# Patient Record
Sex: Female | Born: 1945
Health system: Southern US, Community
[De-identification: ages and names within clinical notes are randomized; demographics above are authoritative.]

## PROBLEM LIST (undated history)

## (undated) DIAGNOSIS — S060XAA Concussion with loss of consciousness status unknown, initial encounter: Secondary | ICD-10-CM

## (undated) DIAGNOSIS — J45909 Unspecified asthma, uncomplicated: Secondary | ICD-10-CM

## (undated) DIAGNOSIS — R569 Unspecified convulsions: Secondary | ICD-10-CM

## (undated) DIAGNOSIS — G5600 Carpal tunnel syndrome, unspecified upper limb: Secondary | ICD-10-CM

## (undated) DIAGNOSIS — M722 Plantar fascial fibromatosis: Secondary | ICD-10-CM

## (undated) DIAGNOSIS — M1990 Unspecified osteoarthritis, unspecified site: Secondary | ICD-10-CM

## (undated) DIAGNOSIS — T7840XA Allergy, unspecified, initial encounter: Secondary | ICD-10-CM

## (undated) DIAGNOSIS — D689 Coagulation defect, unspecified: Secondary | ICD-10-CM

## (undated) DIAGNOSIS — E785 Hyperlipidemia, unspecified: Secondary | ICD-10-CM

## (undated) DIAGNOSIS — S060X9A Concussion with loss of consciousness of unspecified duration, initial encounter: Secondary | ICD-10-CM

## (undated) DIAGNOSIS — I10 Essential (primary) hypertension: Secondary | ICD-10-CM

## (undated) DIAGNOSIS — H269 Unspecified cataract: Secondary | ICD-10-CM

## (undated) DIAGNOSIS — C801 Malignant (primary) neoplasm, unspecified: Secondary | ICD-10-CM

## (undated) DIAGNOSIS — K219 Gastro-esophageal reflux disease without esophagitis: Secondary | ICD-10-CM

## (undated) DIAGNOSIS — Z973 Presence of spectacles and contact lenses: Secondary | ICD-10-CM

## (undated) HISTORY — DX: Hyperlipidemia, unspecified: E78.5

## (undated) HISTORY — PX: VAGINAL HYSTERECTOMY: SUR661

## (undated) HISTORY — PX: FRACTURE SURGERY: SHX138

## (undated) HISTORY — PX: CARDIAC CATHETERIZATION: SHX172

## (undated) HISTORY — DX: Concussion with loss of consciousness of unspecified duration, initial encounter: S06.0X9A

## (undated) HISTORY — PX: DILATION AND CURETTAGE OF UTERUS: SHX78

## (undated) HISTORY — PX: COLONOSCOPY: SHX174

## (undated) HISTORY — DX: Concussion with loss of consciousness status unknown, initial encounter: S06.0XAA

## (undated) HISTORY — DX: Allergy, unspecified, initial encounter: T78.40XA

## (undated) HISTORY — DX: Unspecified cataract: H26.9

## (undated) HISTORY — DX: Coagulation defect, unspecified: D68.9

---
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
## 1997-11-19 ENCOUNTER — Encounter: Admission: RE | Admit: 1997-11-19 | Discharge: 1997-11-19 | Payer: Self-pay | Admitting: Internal Medicine

## 1997-12-03 ENCOUNTER — Encounter: Admission: RE | Admit: 1997-12-03 | Discharge: 1997-12-03 | Payer: Self-pay | Admitting: Internal Medicine

## 1997-12-31 ENCOUNTER — Encounter: Admission: RE | Admit: 1997-12-31 | Discharge: 1997-12-31 | Payer: Self-pay | Admitting: Internal Medicine

## 1998-07-08 ENCOUNTER — Encounter: Admission: RE | Admit: 1998-07-08 | Discharge: 1998-07-08 | Payer: Self-pay | Admitting: Internal Medicine

## 1998-11-16 ENCOUNTER — Encounter: Admission: RE | Admit: 1998-11-16 | Discharge: 1998-11-16 | Payer: Self-pay | Admitting: Internal Medicine

## 1998-12-21 ENCOUNTER — Ambulatory Visit (HOSPITAL_COMMUNITY): Admission: RE | Admit: 1998-12-21 | Discharge: 1998-12-21 | Payer: Self-pay | Admitting: *Deleted

## 2000-01-03 ENCOUNTER — Encounter: Admission: RE | Admit: 2000-01-03 | Discharge: 2000-01-03 | Payer: Self-pay | Admitting: Internal Medicine

## 2000-01-05 ENCOUNTER — Ambulatory Visit (HOSPITAL_COMMUNITY): Admission: RE | Admit: 2000-01-05 | Discharge: 2000-01-05 | Payer: Self-pay

## 2000-01-31 ENCOUNTER — Encounter: Admission: RE | Admit: 2000-01-31 | Discharge: 2000-01-31 | Payer: Self-pay | Admitting: Internal Medicine

## 2004-04-29 ENCOUNTER — Encounter: Admission: RE | Admit: 2004-04-29 | Discharge: 2004-04-29 | Payer: Self-pay | Admitting: Internal Medicine

## 2004-06-25 ENCOUNTER — Ambulatory Visit (HOSPITAL_COMMUNITY): Admission: RE | Admit: 2004-06-25 | Discharge: 2004-06-25 | Payer: Self-pay | Admitting: *Deleted

## 2006-08-15 ENCOUNTER — Encounter: Admission: RE | Admit: 2006-08-15 | Discharge: 2006-08-15 | Payer: Self-pay | Admitting: Internal Medicine

## 2007-02-19 ENCOUNTER — Emergency Department (HOSPITAL_COMMUNITY): Admission: EM | Admit: 2007-02-19 | Discharge: 2007-02-19 | Payer: Self-pay | Admitting: Emergency Medicine

## 2008-02-18 ENCOUNTER — Emergency Department (HOSPITAL_COMMUNITY): Admission: EM | Admit: 2008-02-18 | Discharge: 2008-02-18 | Payer: Self-pay | Admitting: Family Medicine

## 2008-07-21 ENCOUNTER — Encounter: Admission: RE | Admit: 2008-07-21 | Discharge: 2008-07-21 | Payer: Self-pay | Admitting: Internal Medicine

## 2010-01-08 ENCOUNTER — Encounter: Admission: RE | Admit: 2010-01-08 | Discharge: 2010-01-08 | Payer: Self-pay | Admitting: Internal Medicine

## 2010-01-09 ENCOUNTER — Emergency Department (HOSPITAL_COMMUNITY): Admission: EM | Admit: 2010-01-09 | Discharge: 2010-01-09 | Payer: Self-pay | Admitting: Family Medicine

## 2010-01-09 HISTORY — PX: ORIF WRIST FRACTURE: SHX2133

## 2010-01-17 ENCOUNTER — Observation Stay (HOSPITAL_COMMUNITY): Admission: EM | Admit: 2010-01-17 | Discharge: 2010-01-19 | Payer: Self-pay | Admitting: Emergency Medicine

## 2010-01-18 ENCOUNTER — Encounter (INDEPENDENT_AMBULATORY_CARE_PROVIDER_SITE_OTHER): Payer: Self-pay | Admitting: Family Medicine

## 2010-01-26 ENCOUNTER — Ambulatory Visit (HOSPITAL_BASED_OUTPATIENT_CLINIC_OR_DEPARTMENT_OTHER): Admission: RE | Admit: 2010-01-26 | Discharge: 2010-01-26 | Payer: Self-pay | Admitting: Orthopedic Surgery

## 2010-06-23 LAB — POCT I-STAT, CHEM 8
Calcium, Ion: 1.14 mmol/L (ref 1.12–1.32)
Calcium, Ion: 1.15 mmol/L (ref 1.12–1.32)
Chloride: 107 mEq/L (ref 96–112)
Creatinine, Ser: 1.1 mg/dL (ref 0.4–1.2)
Glucose, Bld: 100 mg/dL — ABNORMAL HIGH (ref 70–99)
Glucose, Bld: 96 mg/dL (ref 70–99)
HCT: 39 % (ref 36.0–46.0)
HCT: 40 % (ref 36.0–46.0)
Hemoglobin: 13.3 g/dL (ref 12.0–15.0)
Hemoglobin: 13.6 g/dL (ref 12.0–15.0)
TCO2: 29 mmol/L (ref 0–100)
TCO2: 31 mmol/L (ref 0–100)

## 2010-06-24 LAB — D-DIMER, QUANTITATIVE: D-Dimer, Quant: 1.65 ug/mL-FEU — ABNORMAL HIGH (ref 0.00–0.48)

## 2010-06-24 LAB — TROPONIN I: Troponin I: 0.02 ng/mL (ref 0.00–0.06)

## 2010-06-24 LAB — BASIC METABOLIC PANEL
BUN: 6 mg/dL (ref 6–23)
CO2: 28 mEq/L (ref 19–32)
Calcium: 9.2 mg/dL (ref 8.4–10.5)
Calcium: 9.3 mg/dL (ref 8.4–10.5)
Chloride: 103 mEq/L (ref 96–112)
Chloride: 110 mEq/L (ref 96–112)
Creatinine, Ser: 0.79 mg/dL (ref 0.4–1.2)
Creatinine, Ser: 0.89 mg/dL (ref 0.4–1.2)
GFR calc Af Amer: >60 mL/min (ref >60)
GFR calc Af Amer: >60 mL/min (ref >60)
GFR calc non Af Amer: >60 mL/min (ref >60)
Glucose, Bld: 145 mg/dL — ABNORMAL HIGH (ref 70–99)
Potassium: 4 mEq/L (ref 3.5–5.1)
Sodium: 142 mEq/L (ref 135–145)

## 2010-06-24 LAB — CBC
MCH: 30 pg (ref 26.0–34.0)
MCHC: 32.2 g/dL (ref 30.0–36.0)
MCV: 93.6 fL (ref 78.0–100.0)
Platelets: 237 10*3/uL (ref 150–400)
Platelets: 259 10*3/uL (ref 150–400)
RBC: 4.04 MIL/uL (ref 3.87–5.11)
RDW: 13.6 % (ref 11.5–15.5)
WBC: 4.7 10*3/uL (ref 4.0–10.5)

## 2010-06-24 LAB — HEMOGLOBIN A1C: Mean Plasma Glucose: 131 mg/dL — ABNORMAL HIGH (ref <117)

## 2010-06-24 LAB — POCT CARDIAC MARKERS

## 2010-06-24 LAB — CARDIAC PANEL(CRET KIN+CKTOT+MB+TROPI)
CK, MB: 0.8 ng/mL (ref 0.3–4.0)
CK, MB: 1.1 ng/mL (ref 0.3–4.0)
Relative Index: 1.1 (ref 0.0–2.5)
Relative Index: 1.1 (ref 0.0–2.5)
Relative Index: INVALID (ref 0.0–2.5)
Total CK: 104 U/L (ref 7–177)
Total CK: 83 U/L (ref 7–177)
Troponin I: 0.01 ng/mL (ref 0.00–0.06)

## 2010-06-24 LAB — DIFFERENTIAL
Basophils Relative: 1 % (ref 0–1)
Eosinophils Absolute: 0.1 10*3/uL (ref 0.0–0.7)
Neutrophils Relative %: 57 % (ref 43–77)

## 2010-06-24 LAB — LIPID PANEL
HDL: 41 mg/dL (ref >39)
Total CHOL/HDL Ratio: 4.6 RATIO
VLDL: 30 mg/dL (ref 0–40)

## 2010-06-24 LAB — MAGNESIUM: Magnesium: 2.4 mg/dL (ref 1.5–2.5)

## 2011-01-18 LAB — I-STAT 8, (EC8 V) (CONVERTED LAB)
Bicarbonate: 32.1 — ABNORMAL HIGH
Glucose, Bld: 118 — ABNORMAL HIGH
Sodium: 140
TCO2: 34
pH, Ven: 7.336 — ABNORMAL HIGH

## 2011-01-18 LAB — POCT I-STAT CREATININE: Creatinine, Ser: 1

## 2011-03-26 ENCOUNTER — Encounter: Payer: Self-pay | Admitting: *Deleted

## 2011-03-26 ENCOUNTER — Emergency Department (HOSPITAL_COMMUNITY): Payer: 59

## 2011-03-26 ENCOUNTER — Emergency Department (HOSPITAL_COMMUNITY)
Admission: EM | Admit: 2011-03-26 | Discharge: 2011-03-26 | Disposition: A | Discharge status: Home / Self Care | Payer: 59 | Attending: Emergency Medicine | Admitting: Emergency Medicine

## 2011-03-26 ENCOUNTER — Other Ambulatory Visit: Payer: Self-pay

## 2011-03-26 DIAGNOSIS — M8560 Other cyst of bone, unspecified site: Secondary | ICD-10-CM | POA: Insufficient documentation

## 2011-03-26 DIAGNOSIS — M8500 Fibrous dysplasia (monostotic), unspecified site: Secondary | ICD-10-CM

## 2011-03-26 DIAGNOSIS — R1110 Vomiting, unspecified: Secondary | ICD-10-CM | POA: Insufficient documentation

## 2011-03-26 DIAGNOSIS — I10 Essential (primary) hypertension: Secondary | ICD-10-CM | POA: Insufficient documentation

## 2011-03-26 DIAGNOSIS — R197 Diarrhea, unspecified: Secondary | ICD-10-CM | POA: Insufficient documentation

## 2011-03-26 DIAGNOSIS — R42 Dizziness and giddiness: Secondary | ICD-10-CM | POA: Insufficient documentation

## 2011-03-26 DIAGNOSIS — R209 Unspecified disturbances of skin sensation: Secondary | ICD-10-CM | POA: Insufficient documentation

## 2011-03-26 DIAGNOSIS — R51 Headache: Secondary | ICD-10-CM

## 2011-03-26 HISTORY — DX: Essential (primary) hypertension: I10

## 2011-03-26 LAB — URINALYSIS, ROUTINE W REFLEX MICROSCOPIC
Bilirubin Urine: NEGATIVE
Nitrite: NEGATIVE
Specific Gravity, Urine: 1.006 (ref 1.005–1.030)
Urobilinogen, UA: 0.2 mg/dL (ref 0.0–1.0)

## 2011-03-26 LAB — DIFFERENTIAL
Eosinophils Absolute: 0.1 10*3/uL (ref 0.0–0.7)
Eosinophils Relative: 2 % (ref 0–5)
Lymphs Abs: 1.7 10*3/uL (ref 0.7–4.0)
Monocytes Absolute: 0.2 10*3/uL (ref 0.1–1.0)
Monocytes Relative: 4 % (ref 3–12)

## 2011-03-26 LAB — CBC
Hemoglobin: 13.5 g/dL (ref 12.0–15.0)
MCH: 30.1 pg (ref 26.0–34.0)
MCV: 92.4 fL (ref 78.0–100.0)
RBC: 4.48 MIL/uL (ref 3.87–5.11)

## 2011-03-26 LAB — LIPASE, BLOOD: Lipase: 29 U/L (ref 11–59)

## 2011-03-26 LAB — COMPREHENSIVE METABOLIC PANEL
Alkaline Phosphatase: 71 U/L (ref 39–117)
BUN: 12 mg/dL (ref 6–23)
Calcium: 9.3 mg/dL (ref 8.4–10.5)
Creatinine, Ser: 0.79 mg/dL (ref 0.50–1.10)
GFR calc Af Amer: >90 mL/min (ref >90)
Glucose, Bld: 115 mg/dL — ABNORMAL HIGH (ref 70–99)
Total Protein: 7.6 g/dL (ref 6.0–8.3)

## 2011-03-26 LAB — URINE MICROSCOPIC-ADD ON

## 2011-03-26 MED ORDER — MORPHINE SULFATE 4 MG/ML IJ SOLN
4.0000 mg | Freq: Once | INTRAMUSCULAR | Status: AC
  Administered 2011-03-26: 4 mg via INTRAVENOUS
  Filled 2011-03-26: qty 1

## 2011-03-26 MED ORDER — ONDANSETRON HCL 4 MG PO TABS: 4.0000 mg | ORAL_TABLET | Freq: Four times a day (QID) | ORAL | Status: AC

## 2011-03-26 MED ORDER — SODIUM CHLORIDE 0.9 % IV BOLUS (SEPSIS)
1000.0000 mL | Freq: Once | INTRAVENOUS | Status: AC
  Administered 2011-03-26: 1000 mL via INTRAVENOUS

## 2011-03-26 MED ORDER — AMLODIPINE BESYLATE 10 MG PO TABS: 10.0000 mg | ORAL_TABLET | Freq: Every day | ORAL | Status: DC

## 2011-03-26 MED ORDER — HYDROCODONE-ACETAMINOPHEN 5-325 MG PO TABS: 2.0000 | ORAL_TABLET | ORAL | Status: AC | PRN

## 2011-03-26 MED ORDER — ONDANSETRON HCL 4 MG/2ML IJ SOLN
4.0000 mg | Freq: Once | INTRAMUSCULAR | Status: AC
  Administered 2011-03-26: 4 mg via INTRAVENOUS
  Filled 2011-03-26: qty 2

## 2011-03-26 NOTE — ED Provider Notes (Signed)
History     CSN: [id number] Arrival date & time: 03/26/2011  7:56 AM   First MD Initiated Contact with Patient 03/26/11 [phone]      Chief Complaint  Patient presents with  . Hypertension  . Headache    (Consider location/radiation/quality/duration/timing/severity/associated sxs/prior treatment) HPI Patient presents to the emergency room with complaints of vomiting and diarrhea that started this morning. Associated with the vomiting and diarrhea she also had a headache. She was concerned because when she was vomiting she experienced some tingling in her left arm and both of her legs felt heavy. She did feel lightheaded as well. There has been no speech trouble, balance disturbance, or weakness other than the heaviness in her legs. The patient does have history of hypertension but stopped taking her blood pressure medications because the side effects. She denies any chest pain or shortness of breath. Right now she is feeling a bit better than she did this morning. The tingling only lasted for a few minutes. She denies any abdominal pain but still has a mild headache. Past Medical History  Diagnosis Date  . Hypertension     History reviewed. No pertinent past surgical history.  History reviewed. No pertinent family history.  History  Substance Use Topics  . Smoking status: Never Smoker   . Smokeless tobacco: Not on file  . Alcohol Use: Yes     socially    OB History    Grav Para Term Preterm Abortions TAB SAB Ect Mult Living                  Review of Systems  All other systems reviewed and are negative.    Allergies  Review of patient's allergies indicates no known allergies.  Home Medications  No current outpatient prescriptions on file.  BP 184/96  Pulse 71  Temp(Src) 97.6 F (36.4 C) (Oral)  Resp 13  SpO2 98%  Physical Exam  Nursing note and vitals reviewed. Constitutional: She is oriented to person, place, and time. She appears well-developed and  well-nourished. No distress.  HENT:  Head: Normocephalic and atraumatic.  Right Ear: External ear normal.  Left Ear: External ear normal.  Eyes: Conjunctivae and EOM are normal. Pupils are equal, round, and reactive to light. Right eye exhibits no discharge. Left eye exhibits no discharge. No scleral icterus.  Neck: Neck supple. No tracheal deviation present.       No meningeal signs  Cardiovascular: Normal rate, regular rhythm and intact distal pulses.   Pulmonary/Chest: Effort normal and breath sounds normal. No stridor. No respiratory distress. She has no wheezes. She has no rales.  Abdominal: Soft. Bowel sounds are normal. She exhibits no distension and no mass. There is no tenderness. There is no rebound and no guarding.  Musculoskeletal: She exhibits no edema and no tenderness.  Neurological: She is alert and oriented to person, place, and time. She has normal strength. No sensory deficit. Cranial nerve deficit:  no gross defecits noted , no facial droop. She exhibits normal muscle tone. She displays no seizure activity. Coordination normal.       55 strength bilateral upper and lower, sensation light touch intact throughout  Skin: Skin is warm and dry. No rash noted.  Psychiatric: She has a normal mood and affect.    ED Course  Procedures (including critical care time)  Date: 03/26/2011  Rate: 63  Rhythm: normal sinus rhythm  QRS Axis: normal  Intervals: normal  ST/T Wave abnormalities: normal  Conduction Disutrbances:none  Narrative Interpretation:   Old EKG Reviewed: none available   Labs Reviewed  COMPREHENSIVE METABOLIC PANEL - Abnormal; Notable for the following:    Glucose, Bld 115 (*)    GFR calc non Af Amer 87 (*)    All other components within normal limits  URINALYSIS, ROUTINE W REFLEX MICROSCOPIC - Abnormal; Notable for the following:    Protein, ur 30 (*)    Leukocytes, UA MODERATE (*)    All other components within normal limits  URINE MICROSCOPIC-ADD ON -  Abnormal; Notable for the following:    Squamous Epithelial / LPF FEW (*)    Bacteria, UA FEW (*)    All other components within normal limits  CBC  DIFFERENTIAL  LIPASE, BLOOD   Ct Head Wo Contrast  03/26/2011  *RADIOLOGY REPORT*  Clinical Data:  hypertension.  CT HEAD WITHOUT CONTRAST  Technique:  Contiguous axial images were obtained from the base of the skull through the vertex without contrast.  Comparison: None.  Findings:  The brain has a normal appearance without evidence for hemorrhage, infarction, hydrocephalus, or mass lesion.  There is no extra axial fluid collection.  Review of the bone windows demonstrates a slightly expansile, predominately sclerotic lesion involving the right the frontal bone just above the level of the orbit.  This has a ground glass matrix and is suspicious for fibrous dysplasia.  Remaining  calvarium appears normal.  IMPRESSION:  1.  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  No acute intracranial abnormalities.  Original Report Authenticated By: Rosealee Albee, M.D.     MDM  Patient is feeling better at this time. Her blood pressures decreased to the 180s over 80. At this point I do not feel there is any evidence to suggest a hypertensive emergency. There is no evidence to suggest a stroke or cerebral hemorrhage. Her symptoms are not consistent with a subarachnoid hemorrhage. 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 Patient states she has had surgery on that area before.  This is most likely a viral type illness with vomiting and diarrhea. Her abdominal exam is benign. At this time I do not feel there is any evidence to suggest an acute emergency medical condition. Patient will be discharged home and she's been given instructions for warning signs to return to emergency room  Diagnosis: Fibrous dysplasia,  headache, hypertension       Celene Kras, MD 03/26/11 1042

## 2011-03-26 NOTE — ED Notes (Signed)
Family at bedside. Waiting on results.

## 2011-03-26 NOTE — ED Notes (Signed)
Gave old and new ECG to Dr. Lynelle Doctor after I performed 8:19 am JG

## 2011-03-26 NOTE — ED Notes (Signed)
Headache this am, non-complaint with bp meds for months, bp 208/120 pta.

## 2011-03-29 ENCOUNTER — Other Ambulatory Visit: Payer: Self-pay | Admitting: Internal Medicine

## 2011-03-29 DIAGNOSIS — Z1231 Encounter for screening mammogram for malignant neoplasm of breast: Secondary | ICD-10-CM

## 2011-04-19 ENCOUNTER — Ambulatory Visit
Admission: RE | Admit: 2011-04-19 | Discharge: 2011-04-19 | Disposition: A | Discharge status: Home / Self Care | Payer: 59 | Source: Ambulatory Visit | Attending: Internal Medicine | Admitting: Internal Medicine

## 2011-04-19 DIAGNOSIS — Z1231 Encounter for screening mammogram for malignant neoplasm of breast: Secondary | ICD-10-CM

## 2011-10-18 ENCOUNTER — Ambulatory Visit (HOSPITAL_COMMUNITY): Admission: RE | Admit: 2011-10-18 | Payer: 59 | Source: Ambulatory Visit

## 2012-05-10 ENCOUNTER — Other Ambulatory Visit: Payer: Self-pay | Admitting: Internal Medicine

## 2012-05-10 DIAGNOSIS — Z1231 Encounter for screening mammogram for malignant neoplasm of breast: Secondary | ICD-10-CM

## 2012-05-30 ENCOUNTER — Ambulatory Visit: Payer: 59

## 2012-06-06 ENCOUNTER — Ambulatory Visit
Admission: RE | Admit: 2012-06-06 | Discharge: 2012-06-06 | Disposition: A | Discharge status: Home / Self Care | Payer: 59 | Source: Ambulatory Visit | Attending: Internal Medicine | Admitting: Internal Medicine

## 2012-06-06 DIAGNOSIS — Z1231 Encounter for screening mammogram for malignant neoplasm of breast: Secondary | ICD-10-CM

## 2012-08-14 ENCOUNTER — Ambulatory Visit
Admission: RE | Admit: 2012-08-14 | Discharge: 2012-08-14 | Disposition: A | Discharge status: Home / Self Care | Payer: No Typology Code available for payment source | Source: Ambulatory Visit | Attending: Occupational Medicine | Admitting: Occupational Medicine

## 2012-08-14 ENCOUNTER — Other Ambulatory Visit: Payer: Self-pay | Admitting: Occupational Medicine

## 2012-08-14 DIAGNOSIS — R52 Pain, unspecified: Secondary | ICD-10-CM

## 2012-10-24 ENCOUNTER — Emergency Department (HOSPITAL_COMMUNITY)
Admission: EM | Admit: 2012-10-24 | Discharge: 2012-10-24 | Disposition: A | Discharge status: Home / Self Care | Payer: 59 | Attending: Emergency Medicine | Admitting: Emergency Medicine

## 2012-10-24 ENCOUNTER — Emergency Department (HOSPITAL_COMMUNITY): Payer: 59

## 2012-10-24 ENCOUNTER — Other Ambulatory Visit (HOSPITAL_COMMUNITY): Payer: Self-pay | Admitting: Oral and Maxillofacial Surgery

## 2012-10-24 ENCOUNTER — Encounter (HOSPITAL_COMMUNITY): Payer: Self-pay | Admitting: Emergency Medicine

## 2012-10-24 DIAGNOSIS — I10 Essential (primary) hypertension: Secondary | ICD-10-CM | POA: Insufficient documentation

## 2012-10-24 DIAGNOSIS — Z79899 Other long term (current) drug therapy: Secondary | ICD-10-CM | POA: Insufficient documentation

## 2012-10-24 DIAGNOSIS — K089 Disorder of teeth and supporting structures, unspecified: Secondary | ICD-10-CM | POA: Insufficient documentation

## 2012-10-24 DIAGNOSIS — K0889 Other specified disorders of teeth and supporting structures: Secondary | ICD-10-CM

## 2012-10-24 DIAGNOSIS — M26639 Articular disc disorder of temporomandibular joint, unspecified side: Secondary | ICD-10-CM

## 2012-10-24 DIAGNOSIS — K08109 Complete loss of teeth, unspecified cause, unspecified class: Secondary | ICD-10-CM | POA: Insufficient documentation

## 2012-10-24 DIAGNOSIS — Z9889 Other specified postprocedural states: Secondary | ICD-10-CM | POA: Insufficient documentation

## 2012-10-24 MED ORDER — HYDROMORPHONE HCL PF 1 MG/ML IJ SOLN
1.0000 mg | Freq: Once | INTRAMUSCULAR | Status: AC
  Administered 2012-10-24: 1 mg via INTRAVENOUS
  Filled 2012-10-24: qty 1

## 2012-10-24 MED ORDER — METHYLPREDNISOLONE SODIUM SUCC 125 MG IJ SOLR
125.0000 mg | Freq: Once | INTRAMUSCULAR | Status: AC
  Administered 2012-10-24: 125 mg via INTRAVENOUS
  Filled 2012-10-24: qty 2

## 2012-10-24 MED ORDER — ONDANSETRON HCL 4 MG/2ML IJ SOLN
4.0000 mg | Freq: Once | INTRAMUSCULAR | Status: AC
  Administered 2012-10-24: 4 mg via INTRAVENOUS
  Filled 2012-10-24: qty 2

## 2012-10-24 MED ORDER — HYDROCODONE-ACETAMINOPHEN 5-325 MG PO TABS: 2.0000 | ORAL_TABLET | Freq: Four times a day (QID) | ORAL | Status: DC | PRN

## 2012-10-24 MED ORDER — OXYCODONE-ACETAMINOPHEN 5-325 MG PO TABS
2.0000 | ORAL_TABLET | Freq: Once | ORAL | Status: AC
  Administered 2012-10-24: 2 via ORAL
  Filled 2012-10-24: qty 2

## 2012-10-24 MED ORDER — SODIUM CHLORIDE 0.9 % IV BOLUS (SEPSIS)
1000.0000 mL | Freq: Once | INTRAVENOUS | Status: AC
  Administered 2012-10-24: 1000 mL via INTRAVENOUS

## 2012-10-24 MED ORDER — IBUPROFEN 800 MG PO TABS: 800.0000 mg | ORAL_TABLET | Freq: Three times a day (TID) | ORAL | Status: DC

## 2012-10-24 MED ORDER — KETOROLAC TROMETHAMINE 30 MG/ML IJ SOLN
30.0000 mg | Freq: Once | INTRAMUSCULAR | Status: AC
  Administered 2012-10-24: 30 mg via INTRAVENOUS
  Filled 2012-10-24: qty 1

## 2012-10-24 NOTE — ED Notes (Signed)
Bed:WA02<BR> Expected date:<BR> Expected time:<BR> Means of arrival:<BR> Comments:<BR>

## 2012-10-24 NOTE — ED Notes (Signed)
Pt states that she thinks she is having a reaction to taking a half a hydrocodone.  Pt is acting very sleepy, slurring her words.

## 2012-10-24 NOTE — ED Provider Notes (Signed)
History    CSN: [id number] Arrival date & time 10/24/12  1102  First MD Initiated Contact with Patient 10/24/12 1117     Chief Complaint  Patient presents with  . Jaw Pain   (Consider location/radiation/quality/duration/timing/severity/associated sxs/prior Treatment) HPI Comments: Patient presents to the emergency department with chief complaint of tooth pain. She states that she's been having the pain for "a long time." She states that she is being followed by Dr. Chales Salmon from oral surgery. She states that she needs to have an MRI of her mouth. She states that her pain is 10 out of 10. She states that today she tried taking some hydrocodone, but that it did not help. She states that she has had a previous tooth extractions. She denies fevers, chills, nausea, or vomiting.  The history is provided by the patient. No language interpreter was used.   Past Medical History  Diagnosis Date  . Hypertension    History reviewed. No pertinent past surgical history. History reviewed. No pertinent family history. History  Substance Use Topics  . Smoking status: Never Smoker   . Smokeless tobacco: Not on file  . Alcohol Use: Yes     Comment: socially   OB History   Grav Para Term Preterm Abortions TAB SAB Ect Mult Living                 Review of Systems  All other systems reviewed and are negative.    Allergies  Review of patient's allergies indicates no known allergies.  Home Medications   Current Outpatient Rx  Name  Route  Sig  Dispense  Refill  . cyclobenzaprine (FLEXERIL) 10 MG tablet   Oral   Take 10 mg by mouth at bedtime as needed for muscle spasms.         . Hydrocodone-Acetaminophen 7.5-300 MG TABS   Oral   Take 0.5-1 tablets by mouth every 4 (four) hours as needed (for pain).         [name] ketoprofen (ORUDIS) 75 MG capsule   Oral   Take 75 mg by mouth 3 (three) times daily as needed for pain.          BP 148/93  Pulse 78  Temp(Src) 97.9 F (36.6 C)  (Oral)  Resp 18  SpO2 97% Physical Exam  Nursing note and vitals reviewed. Constitutional: She is oriented to person, place, and time. She appears well-developed and well-nourished.  HENT:  Head: Normocephalic and atraumatic.  Mouth/Throat:    Poor dentition throughout.  Affected tooth as diagrammed.  No signs of peritonsillar or tonsillar abscess.  No signs of gingival abscess. Oropharynx is clear and without exudates.  Uvula is midline.  Airway is intact. No signs of Ludwig's angina.   Eyes: Conjunctivae and EOM are normal.  Neck: Normal range of motion.  Cardiovascular: Normal rate.   Pulmonary/Chest: Effort normal.  Abdominal: She exhibits no distension.  Musculoskeletal: Normal range of motion.  Neurological: She is alert and oriented to person, place, and time.  Skin: Skin is dry.  Psychiatric: She has a normal mood and affect. Her behavior is normal. Judgment and thought content normal.    ED Course  Procedures (including critical care time) Results for orders placed during the hospital encounter of 03/26/11  CBC      Result Value Range   WBC 4.3  4.0 - 10.5 K/uL   RBC 4.48  3.87 - 5.11 MIL/uL   Hemoglobin 13.5  12.0 - 15.0 g/dL  HCT 41.4  36.0 - 46.0 %   MCV 92.4  78.0 - 100.0 fL   MCH 30.1  26.0 - 34.0 pg   MCHC 32.6  30.0 - 36.0 g/dL   RDW 16.1  09.6 - 04.5 %   Platelets 233  150 - 400 K/uL  DIFFERENTIAL      Result Value Range   Neutrophils Relative % 53  43 - 77 %   Neutro Abs 2.3  1.7 - 7.7 K/uL   Lymphocytes Relative 40  12 - 46 %   Lymphs Abs 1.7  0.7 - 4.0 K/uL   Monocytes Relative 4  3 - 12 %   Monocytes Absolute 0.2  0.1 - 1.0 K/uL   Eosinophils Relative 2  0 - 5 %   Eosinophils Absolute 0.1  0.0 - 0.7 K/uL   Basophils Relative 1  0 - 1 %   Basophils Absolute 0.0  0.0 - 0.1 K/uL  COMPREHENSIVE METABOLIC PANEL      Result Value Range   Sodium 139  135 - 145 mEq/L   Potassium 3.5  3.5 - 5.1 mEq/L   Chloride 104  96 - 112 mEq/L   CO2 24  19 - 32  mEq/L   Glucose, Bld 115 (*) 70 - 99 mg/dL   BUN 12  6 - 23 mg/dL   Creatinine, Ser 4.09  0.50 - 1.10 mg/dL   Calcium 9.3  8.4 - 81.1 mg/dL   Total Protein 7.6  6.0 - 8.3 g/dL   Albumin 3.6  3.5 - 5.2 g/dL   AST 22  0 - 37 U/L   ALT 13  0 - 35 U/L   Alkaline Phosphatase 71  39 - 117 U/L   Total Bilirubin 0.3  0.3 - 1.2 mg/dL   GFR calc non Af Amer 87 (*) >90 mL/min   GFR calc Af Amer >90  >90 mL/min  LIPASE, BLOOD      Result Value Range   Lipase 29  11 - 59 U/L  URINALYSIS, ROUTINE W REFLEX MICROSCOPIC      Result Value Range   Color, Urine YELLOW  YELLOW   APPearance CLEAR  CLEAR   Specific Gravity, Urine 1.006  1.005 - 1.030   pH 7.5  5.0 - 8.0   Glucose, UA NEGATIVE  NEGATIVE mg/dL   Hgb urine dipstick NEGATIVE  NEGATIVE   Bilirubin Urine NEGATIVE  NEGATIVE   Ketones, ur NEGATIVE  NEGATIVE mg/dL   Protein, ur 30 (*) NEGATIVE mg/dL   Urobilinogen, UA 0.2  0.0 - 1.0 mg/dL   Nitrite NEGATIVE  NEGATIVE   Leukocytes, UA MODERATE (*) NEGATIVE  URINE MICROSCOPIC-ADD ON      Result Value Range   Squamous Epithelial / LPF FEW (*) RARE   WBC, UA 3-6  <3 WBC/hpf   Bacteria, UA FEW (*) RARE   Dg Orthopantogram  10/24/2012   *RADIOLOGY REPORT*  Clinical Data: Right maxillary pain.  ORTHOPANTOGRAM/PANORAMIC  Comparison: None.  Findings: No gross abnormalities are identified involving the maxilla or mandible.  The teeth are somewhat blurred and show no obvious apical abscesses.  Subtle caries cannot be excluded by the current exam.  IMPRESSION: No gross abnormalities.  Subtle dental caries cannot be excluded.   Original Report Authenticated By: Irish Lack, M.D.     1. Tooth pain     MDM  Patient with jaw pain.  She states that she has been seen by oral surgery.  No diagnosis.  Patient  states that pain is 10/10.   Will give percocet and re-evaluate.  12:26 PM Patient still states that the pain is 10/10, and that the pain medication has no done anything to alleviate her  symptoms.  I have discussed the patient with Dr. Adriana Simas, who recommends that I give the patient 1 of dilaudid, toradol, zofran, and solumedrol to control pain and help combat probable underlying inflammatory process.  Will also order orthopantogram.  2:15 PM Patient's pain is 1/10.  Discussed with Dr. Adriana Simas, will discharge to home with ibuprofen and additional norco.  F/u with Dr. Chales Salmon.  Patient understands and agrees with the plan.  Filed Vitals:   10/24/12 1111  BP: 148/93  Pulse: 78  Temp: 97.9 F (36.6 C)  Resp: [id number] [place] 10/24/12 1416

## 2012-10-24 NOTE — ED Notes (Signed)
Per EMS: Pt c/o rt sided facial/jaw pain.  EMS states that pt was hysterical when they arrived.  Pt has dental issues.  Pt states that she took a half a hydrocodone.  Pt states that she "has no idea what is going on".  Pt states that she wants "to run down the hallway screaming".  States she has been having this pain ever since.  Has been to the dentist several times and they cannot find anything.  States that she needs an MRI to find the source of her pain.

## 2012-10-25 NOTE — ED Provider Notes (Signed)
Medical screening examination/treatment/procedure(s) were conducted as a shared visit with non-physician practitioner(s) and myself.  I personally evaluated the patient during the encounter.  No meningeal signs. Patient has followup with oral surgeon.  No clinical evidence of tooth infection.  Will manage pain  Donnetta Hutching, MD 10/25/12 [phone]

## 2012-10-29 ENCOUNTER — Ambulatory Visit (HOSPITAL_COMMUNITY)
Admission: RE | Admit: 2012-10-29 | Discharge: 2012-10-29 | Disposition: A | Discharge status: Home / Self Care | Payer: 59 | Source: Ambulatory Visit | Attending: Oral and Maxillofacial Surgery | Admitting: Oral and Maxillofacial Surgery

## 2012-10-29 DIAGNOSIS — M26639 Articular disc disorder of temporomandibular joint, unspecified side: Secondary | ICD-10-CM

## 2012-10-29 DIAGNOSIS — R6884 Jaw pain: Secondary | ICD-10-CM | POA: Insufficient documentation

## 2013-01-21 ENCOUNTER — Other Ambulatory Visit: Payer: Self-pay | Admitting: Internal Medicine

## 2013-01-21 DIAGNOSIS — E2839 Other primary ovarian failure: Secondary | ICD-10-CM

## 2013-01-28 ENCOUNTER — Ambulatory Visit
Admission: RE | Admit: 2013-01-28 | Discharge: 2013-01-28 | Disposition: A | Discharge status: Home / Self Care | Payer: 59 | Source: Ambulatory Visit | Attending: Internal Medicine | Admitting: Internal Medicine

## 2013-01-28 DIAGNOSIS — E2839 Other primary ovarian failure: Secondary | ICD-10-CM

## 2013-02-22 ENCOUNTER — Encounter (HOSPITAL_BASED_OUTPATIENT_CLINIC_OR_DEPARTMENT_OTHER): Payer: Self-pay | Admitting: *Deleted

## 2013-02-22 NOTE — Progress Notes (Signed)
Said she had lab and ekg at dr Verdia Kuba try to get

## 2013-02-25 NOTE — Progress Notes (Signed)
Surgery chg from 11/18 to 11/19

## 2013-02-27 ENCOUNTER — Ambulatory Visit (HOSPITAL_BASED_OUTPATIENT_CLINIC_OR_DEPARTMENT_OTHER)
Admission: RE | Admit: 2013-02-27 | Discharge: 2013-02-27 | Disposition: A | Discharge status: Home / Self Care | Payer: Worker's Compensation | Source: Ambulatory Visit | Attending: Orthopedic Surgery | Admitting: Orthopedic Surgery

## 2013-02-27 ENCOUNTER — Encounter (HOSPITAL_BASED_OUTPATIENT_CLINIC_OR_DEPARTMENT_OTHER)
Admission: RE | Disposition: A | Discharge status: Home / Self Care | Payer: Self-pay | Source: Ambulatory Visit | Attending: Orthopedic Surgery

## 2013-02-27 ENCOUNTER — Encounter (HOSPITAL_BASED_OUTPATIENT_CLINIC_OR_DEPARTMENT_OTHER): Payer: Worker's Compensation | Admitting: Anesthesiology

## 2013-02-27 ENCOUNTER — Ambulatory Visit (HOSPITAL_BASED_OUTPATIENT_CLINIC_OR_DEPARTMENT_OTHER): Payer: Worker's Compensation | Admitting: Anesthesiology

## 2013-02-27 ENCOUNTER — Encounter (HOSPITAL_BASED_OUTPATIENT_CLINIC_OR_DEPARTMENT_OTHER): Payer: Self-pay | Admitting: Orthopedic Surgery

## 2013-02-27 DIAGNOSIS — M65839 Other synovitis and tenosynovitis, unspecified forearm: Secondary | ICD-10-CM | POA: Insufficient documentation

## 2013-02-27 DIAGNOSIS — I10 Essential (primary) hypertension: Secondary | ICD-10-CM | POA: Insufficient documentation

## 2013-02-27 HISTORY — DX: Unspecified osteoarthritis, unspecified site: M19.90

## 2013-02-27 HISTORY — DX: Presence of spectacles and contact lenses: Z97.3

## 2013-02-27 HISTORY — PX: TRIGGER FINGER RELEASE: SHX641

## 2013-02-27 LAB — POCT HEMOGLOBIN-HEMACUE: Hemoglobin: 13.9 g/dL (ref 12.0–15.0)

## 2013-02-27 SURGERY — RELEASE, A1 PULLEY, FOR TRIGGER FINGER
Anesthesia: Regional | Site: Finger | Laterality: Right | Wound class: Clean

## 2013-02-27 MED ORDER — LACTATED RINGERS IV SOLN
INTRAVENOUS | Status: DC
  Administered 2013-02-27: 14:00:00 via INTRAVENOUS

## 2013-02-27 MED ORDER — MIDAZOLAM HCL 2 MG/2ML IJ SOLN
INTRAMUSCULAR | Status: AC
  Filled 2013-02-27: qty 2

## 2013-02-27 MED ORDER — HYDROCODONE-ACETAMINOPHEN 5-325 MG PO TABS: 1.0000 | ORAL_TABLET | Freq: Four times a day (QID) | ORAL | Status: DC | PRN

## 2013-02-27 MED ORDER — MIDAZOLAM HCL 5 MG/5ML IJ SOLN
INTRAMUSCULAR | Status: DC | PRN
  Administered 2013-02-27: 1 mg via INTRAVENOUS

## 2013-02-27 MED ORDER — LIDOCAINE HCL (CARDIAC) 20 MG/ML IV SOLN
INTRAVENOUS | Status: DC | PRN
  Administered 2013-02-27: 30 mg via INTRAVENOUS

## 2013-02-27 MED ORDER — CEFAZOLIN SODIUM-DEXTROSE 2-3 GM-% IV SOLR
INTRAVENOUS | Status: AC
  Filled 2013-02-27: qty 50

## 2013-02-27 MED ORDER — CHLORHEXIDINE GLUCONATE 4 % EX LIQD: 60.0000 mL | Freq: Once | CUTANEOUS | Status: DC

## 2013-02-27 MED ORDER — FENTANYL CITRATE 0.05 MG/ML IJ SOLN: 50.0000 ug | INTRAMUSCULAR | Status: DC | PRN

## 2013-02-27 MED ORDER — OXYCODONE HCL 5 MG PO TABS
5.0000 mg | ORAL_TABLET | Freq: Once | ORAL | Status: DC | PRN
Start: 2013-02-27 — End: 2013-02-27

## 2013-02-27 MED ORDER — PROPOFOL INFUSION 10 MG/ML OPTIME
INTRAVENOUS | Status: DC | PRN
  Administered 2013-02-27: 75 ug/kg/min via INTRAVENOUS

## 2013-02-27 MED ORDER — ONDANSETRON HCL 4 MG/2ML IJ SOLN: 4.0000 mg | Freq: Once | INTRAMUSCULAR | Status: DC | PRN

## 2013-02-27 MED ORDER — FENTANYL CITRATE 0.05 MG/ML IJ SOLN
INTRAMUSCULAR | Status: DC | PRN
  Administered 2013-02-27: 50 ug via INTRAVENOUS

## 2013-02-27 MED ORDER — MEPERIDINE HCL 25 MG/ML IJ SOLN: 6.2500 mg | INTRAMUSCULAR | Status: DC | PRN

## 2013-02-27 MED ORDER — MIDAZOLAM HCL 2 MG/2ML IJ SOLN: 1.0000 mg | INTRAMUSCULAR | Status: DC | PRN

## 2013-02-27 MED ORDER — ONDANSETRON HCL 4 MG/2ML IJ SOLN
INTRAMUSCULAR | Status: DC | PRN
  Administered 2013-02-27: 4 mg via INTRAVENOUS

## 2013-02-27 MED ORDER — BUPIVACAINE HCL (PF) 0.25 % IJ SOLN
INTRAMUSCULAR | Status: DC | PRN
  Administered 2013-02-27: 6 mL

## 2013-02-27 MED ORDER — OXYCODONE HCL 5 MG/5ML PO SOLN: 5.0000 mg | Freq: Once | ORAL | Status: DC | PRN

## 2013-02-27 MED ORDER — FENTANYL CITRATE 0.05 MG/ML IJ SOLN
INTRAMUSCULAR | Status: AC
  Filled 2013-02-27: qty 4

## 2013-02-27 MED ORDER — HYDROMORPHONE HCL PF 1 MG/ML IJ SOLN: 0.2500 mg | INTRAMUSCULAR | Status: DC | PRN

## 2013-02-27 SURGICAL SUPPLY — 34 items
BANDAGE COBAN STERILE 2 (GAUZE/BANDAGES/DRESSINGS) ×2 IMPLANT
BLADE SURG 15 STRL LF DISP TIS (BLADE) ×1 IMPLANT
BLADE SURG 15 STRL SS (BLADE) ×2
BNDG CMPR 9X4 STRL LF SNTH (GAUZE/BANDAGES/DRESSINGS)
BNDG ESMARK 4X9 LF (GAUZE/BANDAGES/DRESSINGS) IMPLANT
CHLORAPREP W/TINT 26ML (MISCELLANEOUS) ×2 IMPLANT
CORDS BIPOLAR (ELECTRODE) IMPLANT
COVER MAYO STAND STRL (DRAPES) ×2 IMPLANT
COVER TABLE BACK 60X90 (DRAPES) ×2 IMPLANT
CUFF TOURNIQUET SINGLE 18IN (TOURNIQUET CUFF) ×2 IMPLANT
DECANTER SPIKE VIAL GLASS SM (MISCELLANEOUS) IMPLANT
DRAPE EXTREMITY T 121X128X90 (DRAPE) ×2 IMPLANT
DRAPE SURG 17X23 STRL (DRAPES) ×2 IMPLANT
GAUZE XEROFORM 1X8 LF (GAUZE/BANDAGES/DRESSINGS) ×2 IMPLANT
GLOVE BIOGEL PI IND STRL 7.0 (GLOVE) ×1 IMPLANT
GLOVE BIOGEL PI IND STRL 8.5 (GLOVE) ×1 IMPLANT
GLOVE BIOGEL PI INDICATOR 7.0 (GLOVE) ×1
GLOVE BIOGEL PI INDICATOR 8.5 (GLOVE) ×1
GLOVE ECLIPSE 6.5 STRL STRAW (GLOVE) ×2 IMPLANT
GLOVE SURG ORTHO 8.0 STRL STRW (GLOVE) ×2 IMPLANT
GOWN BRE IMP PREV XXLGXLNG (GOWN DISPOSABLE) ×2 IMPLANT
GOWN PREVENTION PLUS XLARGE (GOWN DISPOSABLE) ×2 IMPLANT
NEEDLE 27GAX1X1/2 (NEEDLE) ×2 IMPLANT
NS IRRIG 1000ML POUR BTL (IV SOLUTION) ×2 IMPLANT
PACK BASIN DAY SURGERY FS (CUSTOM PROCEDURE TRAY) ×2 IMPLANT
PADDING CAST ABS 4INX4YD NS (CAST SUPPLIES)
PADDING CAST ABS COTTON 4X4 ST (CAST SUPPLIES) IMPLANT
SPONGE GAUZE 4X4 12PLY (GAUZE/BANDAGES/DRESSINGS) ×2 IMPLANT
STOCKINETTE 4X48 STRL (DRAPES) ×2 IMPLANT
SUT VICRYL RAPIDE 4/0 PS 2 (SUTURE) ×2 IMPLANT
SYR BULB 3OZ (MISCELLANEOUS) ×2 IMPLANT
SYR CONTROL 10ML LL (SYRINGE) ×2 IMPLANT
TOWEL OR 17X24 6PK STRL BLUE (TOWEL DISPOSABLE) ×4 IMPLANT
UNDERPAD 30X30 INCONTINENT (UNDERPADS AND DIAPERS) ×2 IMPLANT

## 2013-02-27 NOTE — Transfer of Care (Signed)
Immediate Anesthesia Transfer of Care Note  Patient: Kristina [name]  Procedure(s) Performed: Procedure(s): RELEASE TRIGGER RIGHT MIDDLE FINGER  (Right)  Patient Location: PACU  Anesthesia Type:Bier block  Level of Consciousness: awake, alert , oriented and patient cooperative  Airway & Oxygen Therapy: Patient Spontanous Breathing and Patient connected to face mask oxygen  Post-op Assessment: Report given to PACU RN and Post -op Vital signs reviewed and stable  Post vital signs: Reviewed and stable  Complications: No apparent anesthesia complications
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
## 2013-02-27 NOTE — Anesthesia Postprocedure Evaluation (Signed)
Anesthesia Post Note  Patient: Kristina [name]  Procedure(s) Performed: Procedure(s) (LRB): RELEASE TRIGGER RIGHT MIDDLE FINGER  (Right)  Anesthesia type: general  Patient location: PACU  Post pain: Pain level controlled  Post assessment: Patient's Cardiovascular Status Stable  Last Vitals:  Filed Vitals:   02/27/13 1600  BP: 146/95  Pulse: 73  Temp:   Resp: 14    Post vital signs: Reviewed and stable  Level of consciousness: sedated  Complications: No apparent anesthesia complications

## 2013-02-27 NOTE — Op Note (Signed)
Dictation Number [id number]

## 2013-02-27 NOTE — Anesthesia Procedure Notes (Addendum)
Anesthesia Regional Block:  Bier block (IV Regional)  Pre-Anesthetic Checklist: ,, timeout performed, Correct Patient, Correct Site, Correct Laterality, Correct Procedure,, site marked, surgical consent,, at surgeon's request Needles:  Injection technique: Single-shot  Needle Type: Other      Needle Gauge: 20 and 20 G    Additional Needles: Bier block (IV Regional) Narrative:   Performed by: Personally   Bier block (IV Regional) Procedure Name: MAC Date/Time: 02/27/2013 3:25 PM Performed by: Nicki Reaper Pre-anesthesia Checklist: Patient identified, Emergency Drugs available, Suction available, Patient being monitored and Timeout performed Patient Re-evaluated:Patient Re-evaluated prior to inductionOxygen Delivery Method: Simple face mask

## 2013-02-27 NOTE — Anesthesia Preprocedure Evaluation (Signed)
Anesthesia Evaluation  Patient identified by MRN, date of birth, ID band Patient awake    Reviewed: Allergy & Precautions, H&P , NPO status , Patient's Chart, lab work & pertinent test results  Airway Mallampati: I TM Distance: >3 FB Neck ROM: Full    Dental   Pulmonary          Cardiovascular hypertension, Pt. on medications     Neuro/Psych    GI/Hepatic   Endo/Other    Renal/GU      Musculoskeletal   Abdominal   Peds  Hematology   Anesthesia Other Findings   Reproductive/Obstetrics                           Anesthesia Physical Anesthesia Plan  ASA: II  Anesthesia Plan: Bier Block   Post-op Pain Management:    Induction: Intravenous  Airway Management Planned: Simple Face Mask  Additional Equipment:   Intra-op Plan:   Post-operative Plan: Extubation in OR  Informed Consent: I have reviewed the patients History and Physical, chart, labs and discussed the procedure including the risks, benefits and alternatives for the proposed anesthesia with the patient or authorized representative who has indicated his/her understanding and acceptance.     Plan Discussed with: CRNA and Surgeon  Anesthesia Plan Comments:         Anesthesia Quick Evaluation

## 2013-02-27 NOTE — Brief Op Note (Signed)
02/27/2013  3:54 PM  PATIENT:  Kristina [name]  67 y.o. female  PRE-OPERATIVE DIAGNOSIS:  stenosing tenosynovitis right middle finger  POST-OPERATIVE DIAGNOSIS:  stenosing tenosynovitis right middle finger  PROCEDURE:  Procedure(s): RELEASE TRIGGER RIGHT MIDDLE FINGER  (Right)  SURGEON:  Surgeon(s) and Role:    * Nicki Reaper, MD - Primary  PHYSICIAN ASSISTANT:   ASSISTANTS: none   ANESTHESIA:   local and regional  EBL:  Total I/O In: 500 [I.V.:500] Out: -   BLOOD ADMINISTERED:none  DRAINS: none   LOCAL MEDICATIONS USED:  BUPIVICAINE   SPECIMEN:  No Specimen  DISPOSITION OF SPECIMEN:  N/A  COUNTS:  YES  TOURNIQUET:   Total Tourniquet Time Documented: Forearm (Right) - 22 minutes Total: Forearm (Right) - 22 minutes   DICTATION: .Other Dictation: Dictation Number [id number]  PLAN OF CARE: Discharge to home after PACU  PATIENT DISPOSITION:  PACU - hemodynamically stable.

## 2013-02-28 ENCOUNTER — Encounter (HOSPITAL_BASED_OUTPATIENT_CLINIC_OR_DEPARTMENT_OTHER): Payer: Self-pay | Admitting: Orthopedic Surgery

## 2013-02-28 NOTE — Op Note (Signed)
NAMESHAROLYN, [name] NO.:  [id number]  MEDICAL RECORD NO.:  [id number]  LOCATION:                                 FACILITY:  PHYSICIAN:  Cindee Salt, M.D.       DATE OF BIRTH:  May 31, 1945  DATE OF PROCEDURE:  02/27/2013 DATE OF DISCHARGE:                              OPERATIVE REPORT   PREOPERATIVE DIAGNOSIS:  Stenosing tenosynovitis, right middle finger.  POSTOPERATIVE DIAGNOSIS:  Stenosing tenosynovitis, right middle finger.  OPERATION:  Release A1 pulley, right middle finger with tenosynovectomy flexor tendons, right middle finger.  SURGEON:  Cindee Salt, MD  ANESTHESIA:  Forearm-based IV regional with local infiltration.  ANESTHESIOLOGIST:  Kaylyn Layer. Michelle Piper, MD  HISTORY:  The patient is a 67 year old female with a history of triggering right middle finger.  This has not responded to conservative treatment.  She has elected to undergo surgical release.  Pre, peri, and postoperative course have been discussed along with risks and complications.  She is aware that there is no guarantee with the surgery, possibility of infection, recurrence of injury to arteries, nerves, tendons, incomplete relief of symptoms and dystrophy.  In the preoperative area, the patient is seen, the extremity marked by both patient and surgeon.  Antibiotic given.  PROCEDURE IN DETAIL:  The patient was brought to the operating room where forearm-based IV regional anesthetic was carried out without difficulty.  She was prepped using ChloraPrep, supine position with the right arm free.  A 3-minute dry time was allowed.  Time-out taken, confirming patient and procedure.  An oblique incision was made over the A1 pulley right middle finger and  carried down through subcutaneous tissue.  The area was infiltrated with 0.25% bupivacaine prior to the incision, approximately 4 mL was used.  The A1 pulley was then identified.  This was found to be markedly thickened along with a very thick  tenosynovial synovitis just proximal to this.  The A1 pulley was released on its radial aspect.  A small incision made centrally in A2. Significant adherence between the flexor tendons was noted along with the adherence to the dorsal aspect of the fibro-osseous canal proximal. This tenosynovitis was then excised with blunt and sharp dissection. Finger placed through a full range motion, no further triggering was noted.  The wound was irrigated with saline.  The skin closed with interrupted 4-0 Vicryl Rapide sutures.  Further injection of 2-3 mL of bupivacaine was given. A sterile compressive dressing with the fingers free was applied.  On deflation of the tourniquet, all fingers immediately pinked.  She was taken to the recovery room for observation in satisfactory condition.  She will be discharged home to return to the [facility] in 1 week on [place].          ______________________________ Cindee Salt, M.D.     GK/MEDQ  D:  02/27/2013  T:  02/28/2013  Job:  [id number]

## 2013-07-16 ENCOUNTER — Other Ambulatory Visit: Payer: Self-pay

## 2013-07-16 DIAGNOSIS — Z1231 Encounter for screening mammogram for malignant neoplasm of breast: Secondary | ICD-10-CM

## 2013-07-23 ENCOUNTER — Ambulatory Visit
Admission: RE | Admit: 2013-07-23 | Discharge: 2013-07-23 | Disposition: A | Discharge status: Home / Self Care | Payer: 59 | Source: Ambulatory Visit

## 2013-07-23 DIAGNOSIS — Z1231 Encounter for screening mammogram for malignant neoplasm of breast: Secondary | ICD-10-CM

## 2014-10-15 ENCOUNTER — Ambulatory Visit: Payer: Self-pay

## 2014-10-16 ENCOUNTER — Ambulatory Visit (INDEPENDENT_AMBULATORY_CARE_PROVIDER_SITE_OTHER): Payer: 59 | Admitting: Podiatry

## 2014-10-16 ENCOUNTER — Encounter: Payer: Self-pay | Admitting: Podiatry

## 2014-10-16 ENCOUNTER — Ambulatory Visit (INDEPENDENT_AMBULATORY_CARE_PROVIDER_SITE_OTHER): Payer: 59

## 2014-10-16 VITALS — BP 139/92 | HR 70 | Resp 12

## 2014-10-16 DIAGNOSIS — M722 Plantar fascial fibromatosis: Secondary | ICD-10-CM

## 2014-10-16 MED ORDER — TRIAMCINOLONE ACETONIDE 10 MG/ML IJ SUSP
10.0000 mg | Freq: Once | INTRAMUSCULAR | Status: AC
  Administered 2014-10-16: 10 mg

## 2014-10-16 NOTE — Progress Notes (Signed)
Subjective:     Patient ID: Kristina [name], female   DOB: 03/27/1946, 69 y.o.   MRN: [id number]  HPI patient states I'm having a lot of pain in my left heel at the insertion of the tendon and it's been going on now for around 4 months. Its worse after sitting or after periods of ambulation but it is now hurting at all times   Review of Systems  All other systems reviewed and are negative.      Objective:   Physical Exam  Constitutional: She is oriented to person, place, and time.  Cardiovascular: Intact distal pulses.   Musculoskeletal: Normal range of motion.  Neurological: She is oriented to person, place, and time.  Skin: Skin is warm.  Nursing note and vitals reviewed.  neurovascular status found to be intact with muscle strength adequate and range of motion of the subtalar midtarsal joint within normal limits. Patient's noted to have good digital perfusion and is well oriented 3 with moderate depression of the arch and has severe discomfort to palpation of the left plantar fascia at its insertion into the calcaneus     Assessment:     Acute plantar fasciitis left with structural imbalance of the foot as a complicating factor    Plan:     H&P and x-rays reviewed with patient. Today I injected the left plantar fascia 3 mg Kenalog 5 mg Xylocaine and applied fascial brace with instructions on usage. Gave instructions on physical therapy and reappoint to recheck in one week

## 2014-10-16 NOTE — Patient Instructions (Signed)

## 2014-10-16 NOTE — Progress Notes (Signed)
   Subjective:    Patient ID: Kristina [name], female    DOB: [birth year], 69 y.o.   MRN: [id number]  HPI PT STATED LT BOTTOM HEEL BEEN PAINFUL FOR 4 MONTHS. THE FOOT IS GETTING WORSE AND GET AGGRAVATED BY PRESSURE, WHEN SITTING FOR A WHILE THEN STAND UP IT GET WORSE. TRIED STRETCHING BUT NO HELP.  Review of Systems  HENT: Positive for sinus pressure.   Neurological: Positive for headaches.  All other systems reviewed and are negative.      Objective:   Physical Exam        Assessment & Plan:

## 2014-10-23 ENCOUNTER — Ambulatory Visit (INDEPENDENT_AMBULATORY_CARE_PROVIDER_SITE_OTHER): Payer: 59 | Admitting: Podiatry

## 2014-10-23 ENCOUNTER — Encounter: Payer: Self-pay | Admitting: Podiatry

## 2014-10-23 VITALS — BP 134/86 | HR 90 | Resp 12

## 2014-10-23 DIAGNOSIS — M722 Plantar fascial fibromatosis: Secondary | ICD-10-CM | POA: Diagnosis not present

## 2014-10-23 MED ORDER — TRIAMCINOLONE ACETONIDE 10 MG/ML IJ SUSP
10.0000 mg | Freq: Once | INTRAMUSCULAR | Status: AC
  Administered 2014-10-23: 10 mg

## 2014-10-23 NOTE — Progress Notes (Signed)
Subjective:     Patient ID: Kristina [name], female   DOB: 1945/11/12, 69 y.o.   MRN: [id number]  HPI patient states my left heel is improving but is still tender upon palpation   Review of Systems     Objective:   Physical Exam Neurovascular status intact with diminished discomfort left plantar fashion at the insertion to the calcaneus with fluid still present and pain with palpation    Assessment:     Plantar fasciitis left that is continuing to improve but is sore with palpation    Plan:     Reviewed condition and reinjected plantar fascia 3 mg Kenalog 5 mg Xylocaine advised on physical therapy and shoe gear modification and discharge and less any issues should occur

## 2015-05-01 ENCOUNTER — Encounter: Payer: Self-pay | Admitting: Podiatry

## 2015-05-01 ENCOUNTER — Ambulatory Visit (INDEPENDENT_AMBULATORY_CARE_PROVIDER_SITE_OTHER): Payer: 59 | Admitting: Podiatry

## 2015-05-01 VITALS — BP 157/94 | HR 82 | Resp 12

## 2015-05-01 DIAGNOSIS — M779 Enthesopathy, unspecified: Secondary | ICD-10-CM

## 2015-05-01 DIAGNOSIS — M722 Plantar fascial fibromatosis: Secondary | ICD-10-CM

## 2015-05-01 MED ORDER — TRIAMCINOLONE ACETONIDE 10 MG/ML IJ SUSP
10.0000 mg | Freq: Once | INTRAMUSCULAR | Status: AC
  Administered 2015-05-01: 10 mg

## 2015-05-01 NOTE — Progress Notes (Signed)
Subjective:     Patient ID: Kristina [name], female   DOB: 1945-10-26, 70 y.o.   MRN: [id number]  HPI patient states my left foot is hurting me more in my ankle and my right heel has started to become very tender   Review of Systems     Objective:   Physical Exam Neurovascular status intact with patient being seen for 8 months with plantar heel left that's done very well but pain in the posterior tibial tendon group and pain in the plantar heel right    Assessment:     Posterior tibial tendinitis left with intact muscle strength and function and plantar fasciitis right acute in nature    Plan:     H&P conditions discussed and careful sheath injection administered left 3 mg Kenalog 5 mg Xylocaine and the right heel plantar fascia is injected 3 mg Kenalog 5 mg Xylocaine and applied fascial brace bilateral with instructions on usage

## 2015-05-13 ENCOUNTER — Ambulatory Visit (INDEPENDENT_AMBULATORY_CARE_PROVIDER_SITE_OTHER): Payer: 59 | Admitting: Podiatry

## 2015-05-13 DIAGNOSIS — M779 Enthesopathy, unspecified: Secondary | ICD-10-CM

## 2015-05-13 DIAGNOSIS — M722 Plantar fascial fibromatosis: Secondary | ICD-10-CM | POA: Diagnosis not present

## 2015-05-13 NOTE — Progress Notes (Signed)
Subjective:     Patient ID: Kristina [name], female   DOB: Mar 18, 1946, 71 y.o.   MRN: [id number]  HPI patient states I'm doing quite a bit better with occasional discomfort but able to walk without much pain   Review of Systems     Objective:   Physical Exam  neurovascular status intact muscle strength adequate with continued discomfort in the posterior tib tendon left with deep palpation    Assessment:      improvement of tendinitis fasciitis with mild pain noted    Plan:      reviewed condition and advised on physical therapy anti-inflammatories and reappoint to recheck

## 2015-08-20 ENCOUNTER — Ambulatory Visit (INDEPENDENT_AMBULATORY_CARE_PROVIDER_SITE_OTHER): Payer: 59 | Admitting: Podiatry

## 2015-08-20 ENCOUNTER — Encounter: Payer: Self-pay | Admitting: Podiatry

## 2015-08-20 VITALS — BP 144/85 | HR 76 | Resp 16

## 2015-08-20 DIAGNOSIS — M7662 Achilles tendinitis, left leg: Secondary | ICD-10-CM

## 2015-08-20 MED ORDER — TRIAMCINOLONE ACETONIDE 10 MG/ML IJ SUSP
10.0000 mg | Freq: Once | INTRAMUSCULAR | Status: AC
  Administered 2015-08-20: 10 mg

## 2015-08-20 NOTE — Progress Notes (Signed)
Subjective:     Patient ID: Kristina [name], female   DOB: Jul 11, 1945, 70 y.o.   MRN: [id number]  HPI patient states she's developed pain in the back of the heel left over right and this is just been occurring over the last month. Does not remember specific injury   Review of Systems     Objective:   Physical Exam Neurovascular status intact with posterior heel pain left medial side at the Achilles tendon insertion that's inflamed with fluid buildup noted    Assessment:     Achilles tendinitis medial side left    Plan:     Careful injection of the medial Achilles tendon 3 mg Dexon some Kenalog after explaining chances of rupture to her and she was willing to accept risk. Tolerated procedure well advised on reduced activity and gave instructions on stretching exercises start in the next several days and we may need to use medicine on the right if it were to continue to give her problems

## 2015-08-20 NOTE — Patient Instructions (Signed)

## 2015-08-26 ENCOUNTER — Other Ambulatory Visit: Payer: Self-pay | Admitting: Internal Medicine

## 2015-08-26 DIAGNOSIS — E2839 Other primary ovarian failure: Secondary | ICD-10-CM

## 2015-09-28 ENCOUNTER — Inpatient Hospital Stay: Admission: RE | Admit: 2015-09-28 | Payer: Self-pay | Source: Ambulatory Visit

## 2015-10-12 ENCOUNTER — Other Ambulatory Visit: Payer: Self-pay

## 2015-10-19 ENCOUNTER — Ambulatory Visit
Admission: RE | Admit: 2015-10-19 | Discharge: 2015-10-19 | Disposition: A | Discharge status: Home / Self Care | Payer: 59 | Source: Ambulatory Visit | Attending: Internal Medicine | Admitting: Internal Medicine

## 2015-10-19 DIAGNOSIS — E2839 Other primary ovarian failure: Secondary | ICD-10-CM

## 2015-12-08 ENCOUNTER — Ambulatory Visit (HOSPITAL_COMMUNITY): Admission: EM | Admit: 2015-12-08 | Discharge: 2015-12-08 | Discharge status: Absent without leave | Payer: 59

## 2015-12-25 DIAGNOSIS — M25532 Pain in left wrist: Secondary | ICD-10-CM | POA: Insufficient documentation

## 2016-02-10 ENCOUNTER — Other Ambulatory Visit: Payer: Self-pay | Admitting: Orthopedic Surgery

## 2016-02-10 DIAGNOSIS — G5602 Carpal tunnel syndrome, left upper limb: Secondary | ICD-10-CM | POA: Diagnosis present

## 2016-03-08 ENCOUNTER — Encounter (HOSPITAL_BASED_OUTPATIENT_CLINIC_OR_DEPARTMENT_OTHER): Payer: Self-pay | Admitting: *Deleted

## 2016-03-09 ENCOUNTER — Encounter (HOSPITAL_BASED_OUTPATIENT_CLINIC_OR_DEPARTMENT_OTHER)
Admission: RE | Admit: 2016-03-09 | Discharge: 2016-03-09 | Disposition: A | Discharge status: Home / Self Care | Payer: 59 | Source: Ambulatory Visit | Attending: Orthopedic Surgery | Admitting: Orthopedic Surgery

## 2016-03-09 DIAGNOSIS — G5622 Lesion of ulnar nerve, left upper limb: Secondary | ICD-10-CM | POA: Diagnosis not present

## 2016-03-09 DIAGNOSIS — Z01818 Encounter for other preprocedural examination: Secondary | ICD-10-CM | POA: Insufficient documentation

## 2016-03-09 DIAGNOSIS — I498 Other specified cardiac arrhythmias: Secondary | ICD-10-CM | POA: Diagnosis not present

## 2016-03-09 DIAGNOSIS — J45909 Unspecified asthma, uncomplicated: Secondary | ICD-10-CM | POA: Diagnosis not present

## 2016-03-09 DIAGNOSIS — E669 Obesity, unspecified: Secondary | ICD-10-CM | POA: Diagnosis not present

## 2016-03-09 DIAGNOSIS — I10 Essential (primary) hypertension: Secondary | ICD-10-CM | POA: Diagnosis not present

## 2016-03-09 DIAGNOSIS — G5602 Carpal tunnel syndrome, left upper limb: Secondary | ICD-10-CM | POA: Insufficient documentation

## 2016-03-09 DIAGNOSIS — Z6832 Body mass index (BMI) 32.0-32.9, adult: Secondary | ICD-10-CM | POA: Diagnosis not present

## 2016-03-09 DIAGNOSIS — M1990 Unspecified osteoarthritis, unspecified site: Secondary | ICD-10-CM | POA: Diagnosis not present

## 2016-03-09 LAB — BASIC METABOLIC PANEL
Anion gap: 7 (ref 5–15)
BUN: 6 mg/dL (ref 6–20)
CO2: 27 mmol/L (ref 22–32)
CREATININE: 0.97 mg/dL (ref 0.44–1.00)
Calcium: 9.5 mg/dL (ref 8.9–10.3)
Chloride: 106 mmol/L (ref 101–111)
GFR calc Af Amer: >60 mL/min (ref >60)
GFR, EST NON AFRICAN AMERICAN: 59 mL/min — AB (ref >60)
GLUCOSE: 98 mg/dL (ref 65–99)
Potassium: 4.1 mmol/L (ref 3.5–5.1)
SODIUM: 140 mmol/L (ref 135–145)

## 2016-03-10 ENCOUNTER — Encounter (HOSPITAL_BASED_OUTPATIENT_CLINIC_OR_DEPARTMENT_OTHER)
Admission: RE | Disposition: A | Discharge status: Home / Self Care | Payer: Self-pay | Source: Ambulatory Visit | Attending: Orthopedic Surgery

## 2016-03-10 ENCOUNTER — Ambulatory Visit (HOSPITAL_BASED_OUTPATIENT_CLINIC_OR_DEPARTMENT_OTHER): Payer: Worker's Compensation | Admitting: Anesthesiology

## 2016-03-10 ENCOUNTER — Ambulatory Visit (HOSPITAL_BASED_OUTPATIENT_CLINIC_OR_DEPARTMENT_OTHER)
Admission: RE | Admit: 2016-03-10 | Discharge: 2016-03-10 | Disposition: A | Discharge status: Home / Self Care | Payer: Worker's Compensation | Source: Ambulatory Visit | Attending: Orthopedic Surgery | Admitting: Orthopedic Surgery

## 2016-03-10 ENCOUNTER — Encounter (HOSPITAL_BASED_OUTPATIENT_CLINIC_OR_DEPARTMENT_OTHER): Payer: Self-pay | Admitting: Anesthesiology

## 2016-03-10 DIAGNOSIS — E669 Obesity, unspecified: Secondary | ICD-10-CM | POA: Insufficient documentation

## 2016-03-10 DIAGNOSIS — G5602 Carpal tunnel syndrome, left upper limb: Secondary | ICD-10-CM | POA: Diagnosis not present

## 2016-03-10 DIAGNOSIS — J45909 Unspecified asthma, uncomplicated: Secondary | ICD-10-CM | POA: Diagnosis not present

## 2016-03-10 DIAGNOSIS — Z6832 Body mass index (BMI) 32.0-32.9, adult: Secondary | ICD-10-CM | POA: Insufficient documentation

## 2016-03-10 DIAGNOSIS — I10 Essential (primary) hypertension: Secondary | ICD-10-CM | POA: Diagnosis not present

## 2016-03-10 DIAGNOSIS — M1990 Unspecified osteoarthritis, unspecified site: Secondary | ICD-10-CM | POA: Insufficient documentation

## 2016-03-10 DIAGNOSIS — G5622 Lesion of ulnar nerve, left upper limb: Secondary | ICD-10-CM | POA: Insufficient documentation

## 2016-03-10 HISTORY — PX: ULNAR NERVE TRANSPOSITION: SHX2595

## 2016-03-10 HISTORY — DX: Carpal tunnel syndrome, unspecified upper limb: G56.00

## 2016-03-10 HISTORY — DX: Unspecified asthma, uncomplicated: J45.909

## 2016-03-10 HISTORY — PX: CARPAL TUNNEL RELEASE: SHX101

## 2016-03-10 SURGERY — CARPAL TUNNEL RELEASE
Anesthesia: Regional | Site: Hand | Laterality: Left

## 2016-03-10 MED ORDER — PHENYLEPHRINE 40 MCG/ML (10ML) SYRINGE FOR IV PUSH (FOR BLOOD PRESSURE SUPPORT)
PREFILLED_SYRINGE | INTRAVENOUS | Status: AC
  Filled 2016-03-10: qty 10

## 2016-03-10 MED ORDER — HYDROCODONE-ACETAMINOPHEN 7.5-325 MG PO TABS: 1.0000 | ORAL_TABLET | Freq: Once | ORAL | Status: DC | PRN

## 2016-03-10 MED ORDER — BUPIVACAINE-EPINEPHRINE (PF) 0.5% -1:200000 IJ SOLN
INTRAMUSCULAR | Status: DC | PRN
  Administered 2016-03-10: 30 mL via PERINEURAL

## 2016-03-10 MED ORDER — SCOPOLAMINE 1 MG/3DAYS TD PT72: 1.0000 | MEDICATED_PATCH | Freq: Once | TRANSDERMAL | Status: DC | PRN

## 2016-03-10 MED ORDER — ONDANSETRON HCL 4 MG/2ML IJ SOLN
INTRAMUSCULAR | Status: AC
  Filled 2016-03-10: qty 2

## 2016-03-10 MED ORDER — FENTANYL CITRATE (PF) 100 MCG/2ML IJ SOLN: 25.0000 ug | INTRAMUSCULAR | Status: DC | PRN

## 2016-03-10 MED ORDER — MIDAZOLAM HCL 2 MG/2ML IJ SOLN
INTRAMUSCULAR | Status: AC
  Filled 2016-03-10: qty 2

## 2016-03-10 MED ORDER — LIDOCAINE HCL (CARDIAC) 20 MG/ML IV SOLN
INTRAVENOUS | Status: DC | PRN
  Administered 2016-03-10: 30 mg via INTRAVENOUS

## 2016-03-10 MED ORDER — CHLORHEXIDINE GLUCONATE 4 % EX LIQD: 60.0000 mL | Freq: Once | CUTANEOUS | Status: DC

## 2016-03-10 MED ORDER — FENTANYL CITRATE (PF) 100 MCG/2ML IJ SOLN
50.0000 ug | INTRAMUSCULAR | Status: DC | PRN
  Administered 2016-03-10: 50 ug via INTRAVENOUS

## 2016-03-10 MED ORDER — PROMETHAZINE HCL 25 MG/ML IJ SOLN: 6.2500 mg | INTRAMUSCULAR | Status: DC | PRN

## 2016-03-10 MED ORDER — CEFAZOLIN SODIUM-DEXTROSE 2-4 GM/100ML-% IV SOLN
2.0000 g | INTRAVENOUS | Status: AC
  Administered 2016-03-10: 2 g via INTRAVENOUS

## 2016-03-10 MED ORDER — LIDOCAINE 2% (20 MG/ML) 5 ML SYRINGE
INTRAMUSCULAR | Status: AC
  Filled 2016-03-10: qty 5

## 2016-03-10 MED ORDER — MIDAZOLAM HCL 2 MG/2ML IJ SOLN
1.0000 mg | INTRAMUSCULAR | Status: DC | PRN
  Administered 2016-03-10: 2 mg via INTRAVENOUS

## 2016-03-10 MED ORDER — CEFAZOLIN SODIUM-DEXTROSE 2-4 GM/100ML-% IV SOLN
INTRAVENOUS | Status: AC
  Filled 2016-03-10: qty 100

## 2016-03-10 MED ORDER — HYDROCODONE-ACETAMINOPHEN 5-325 MG PO TABS: 1.0000 | ORAL_TABLET | Freq: Four times a day (QID) | ORAL | 0 refills | Status: DC | PRN

## 2016-03-10 MED ORDER — MIDAZOLAM HCL 5 MG/5ML IJ SOLN
INTRAMUSCULAR | Status: DC | PRN
  Administered 2016-03-10: 2 mg via INTRAVENOUS

## 2016-03-10 MED ORDER — HYDROCODONE-ACETAMINOPHEN 5-325 MG PO TABS
ORAL_TABLET | ORAL | Status: AC
  Filled 2016-03-10: qty 1

## 2016-03-10 MED ORDER — LACTATED RINGERS IV SOLN
INTRAVENOUS | Status: DC
  Administered 2016-03-10: 09:00:00 via INTRAVENOUS

## 2016-03-10 MED ORDER — PROPOFOL 10 MG/ML IV BOLUS
INTRAVENOUS | Status: AC
  Filled 2016-03-10: qty 20

## 2016-03-10 MED ORDER — PHENYLEPHRINE HCL 10 MG/ML IJ SOLN
INTRAMUSCULAR | Status: DC | PRN
  Administered 2016-03-10 (×7): 80 ug via INTRAVENOUS

## 2016-03-10 MED ORDER — PROPOFOL 10 MG/ML IV BOLUS
INTRAVENOUS | Status: DC | PRN
  Administered 2016-03-10: 100 mg via INTRAVENOUS
  Administered 2016-03-10: 200 mg via INTRAVENOUS
  Administered 2016-03-10: 100 mg via INTRAVENOUS

## 2016-03-10 MED ORDER — MEPERIDINE HCL 25 MG/ML IJ SOLN: 6.2500 mg | INTRAMUSCULAR | Status: DC | PRN

## 2016-03-10 MED ORDER — ONDANSETRON HCL 4 MG/2ML IJ SOLN
INTRAMUSCULAR | Status: DC | PRN
  Administered 2016-03-10: 4 mg via INTRAVENOUS

## 2016-03-10 MED ORDER — FENTANYL CITRATE (PF) 100 MCG/2ML IJ SOLN
INTRAMUSCULAR | Status: AC
  Filled 2016-03-10: qty 2

## 2016-03-10 MED ORDER — DEXAMETHASONE SODIUM PHOSPHATE 10 MG/ML IJ SOLN
INTRAMUSCULAR | Status: DC | PRN
  Administered 2016-03-10: 10 mg via INTRAVENOUS

## 2016-03-10 MED ORDER — HYDROCODONE-ACETAMINOPHEN 5-325 MG PO TABS
1.0000 | ORAL_TABLET | Freq: Once | ORAL | Status: AC
  Administered 2016-03-10: 1 via ORAL

## 2016-03-10 MED ORDER — FENTANYL CITRATE (PF) 100 MCG/2ML IJ SOLN
INTRAMUSCULAR | Status: DC | PRN
  Administered 2016-03-10: 100 ug via INTRAVENOUS

## 2016-03-10 MED ORDER — DEXAMETHASONE SODIUM PHOSPHATE 10 MG/ML IJ SOLN
INTRAMUSCULAR | Status: AC
  Filled 2016-03-10: qty 1

## 2016-03-10 SURGICAL SUPPLY — 48 items
BLADE MINI RND TIP GREEN BEAV (BLADE) IMPLANT
BLADE SURG 15 STRL LF DISP TIS (BLADE) ×2 IMPLANT
BLADE SURG 15 STRL SS (BLADE) ×4
BNDG CMPR 9X4 STRL LF SNTH (GAUZE/BANDAGES/DRESSINGS) ×1
BNDG COHESIVE 3X5 TAN STRL LF (GAUZE/BANDAGES/DRESSINGS) ×8 IMPLANT
BNDG COHESIVE 4X5 TAN STRL (GAUZE/BANDAGES/DRESSINGS) ×8 IMPLANT
BNDG ESMARK 4X9 LF (GAUZE/BANDAGES/DRESSINGS) ×4 IMPLANT
BNDG GAUZE ELAST 4 BULKY (GAUZE/BANDAGES/DRESSINGS) ×8 IMPLANT
CHLORAPREP W/TINT 26ML (MISCELLANEOUS) ×4 IMPLANT
CORDS BIPOLAR (ELECTRODE) ×4 IMPLANT
COVER BACK TABLE 60X90IN (DRAPES) ×4 IMPLANT
COVER MAYO STAND STRL (DRAPES) ×4 IMPLANT
CUFF TOURN SGL LL 18 NRW (TOURNIQUET CUFF) ×4 IMPLANT
CUFF TOURNIQUET SINGLE 18IN (TOURNIQUET CUFF) ×4 IMPLANT
DECANTER SPIKE VIAL GLASS SM (MISCELLANEOUS) IMPLANT
DRAPE EXTREMITY T 121X128X90 (DRAPE) ×4 IMPLANT
DRAPE SURG 17X23 STRL (DRAPES) ×4 IMPLANT
DRSG PAD ABDOMINAL 8X10 ST (GAUZE/BANDAGES/DRESSINGS) ×8 IMPLANT
GAUZE SPONGE 4X4 12PLY STRL (GAUZE/BANDAGES/DRESSINGS) ×4 IMPLANT
GAUZE SPONGE 4X4 16PLY XRAY LF (GAUZE/BANDAGES/DRESSINGS) IMPLANT
GAUZE XEROFORM 1X8 LF (GAUZE/BANDAGES/DRESSINGS) ×8 IMPLANT
GLOVE BIOGEL PI IND STRL 8.5 (GLOVE) ×2 IMPLANT
GLOVE BIOGEL PI INDICATOR 8.5 (GLOVE) ×2
GLOVE SURG ORTHO 8.0 STRL STRW (GLOVE) ×4 IMPLANT
GOWN STRL REUS W/ TWL LRG LVL3 (GOWN DISPOSABLE) ×2 IMPLANT
GOWN STRL REUS W/TWL LRG LVL3 (GOWN DISPOSABLE) ×4
GOWN STRL REUS W/TWL XL LVL3 (GOWN DISPOSABLE) ×4 IMPLANT
LOOP VESSEL MAXI BLUE (MISCELLANEOUS) IMPLANT
NEEDLE PRECISIONGLIDE 27X1.5 (NEEDLE) IMPLANT
NS IRRIG 1000ML POUR BTL (IV SOLUTION) ×4 IMPLANT
PACK BASIN DAY SURGERY FS (CUSTOM PROCEDURE TRAY) ×4 IMPLANT
PAD CAST 3X4 CTTN HI CHSV (CAST SUPPLIES) IMPLANT
PAD CAST 4YDX4 CTTN HI CHSV (CAST SUPPLIES) ×2 IMPLANT
PADDING CAST COTTON 3X4 STRL (CAST SUPPLIES)
PADDING CAST COTTON 4X4 STRL (CAST SUPPLIES) ×4
SLEEVE SCD COMPRESS KNEE MED (MISCELLANEOUS) ×4 IMPLANT
SPLINT PLASTER CAST XFAST 3X15 (CAST SUPPLIES) IMPLANT
SPLINT PLASTER XTRA FASTSET 3X (CAST SUPPLIES)
SPONGE GAUZE 4X4 12PLY STER LF (GAUZE/BANDAGES/DRESSINGS) ×8 IMPLANT
STOCKINETTE 4X48 STRL (DRAPES) ×4 IMPLANT
SUT ETHILON 4 0 PS 2 18 (SUTURE) ×8 IMPLANT
SUT VIC AB 2-0 SH 27 (SUTURE) ×3
SUT VIC AB 2-0 SH 27XBRD (SUTURE) ×2 IMPLANT
SUT VICRYL 4-0 PS2 18IN ABS (SUTURE) ×4 IMPLANT
SYR BULB 3OZ (MISCELLANEOUS) ×4 IMPLANT
SYR CONTROL 10ML LL (SYRINGE) IMPLANT
TOWEL OR 17X24 6PK STRL BLUE (TOWEL DISPOSABLE) ×4 IMPLANT
UNDERPAD 30X30 (UNDERPADS AND DIAPERS) ×4 IMPLANT

## 2016-03-10 NOTE — Brief Op Note (Signed)
03/10/2016  11:12 AM  PATIENT:  Kristina [name]  70 y.o. female  PRE-OPERATIVE DIAGNOSIS:  carpal tunnel left cubital tunnel left  POST-OPERATIVE DIAGNOSIS:  carpal tunnel left cubital tunnel left  PROCEDURE:  Procedure(s): CARPAL TUNNEL RELEASE, left (Left) ULNAR NERVE DECOMPRESSION possible TRANSPOSITION (Left)  SURGEON:  Surgeon(s) and Role:    * Daryll Brod, MD - Primary  PHYSICIAN ASSISTANT:   ASSISTANTS: none   ANESTHESIA:   regional and IV sedation  EBL:  Total I/O In: -  Out: 4 [Blood:4]  BLOOD ADMINISTERED:none  DRAINS: none   LOCAL MEDICATIONS USED:  NONE  SPECIMEN:  No Specimen  DISPOSITION OF SPECIMEN:  N/A  COUNTS:  YES  TOURNIQUET:   Total Tourniquet Time Documented: Upper Arm (Left) - 32 minutes Total: Upper Arm (Left) - 32 minutes   DICTATION: .Other Dictation: Dictation Number dictated 03/10/2016 @ 11:18  PLAN OF CARE: Discharge to home after PACU  PATIENT DISPOSITION:  PACU - hemodynamically stable.
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
## 2016-03-10 NOTE — H&P (Signed)
Assisted Dr. Foster with left, ultrasound guided, supraclavicular block. Side rails up, monitors on throughout procedure. See vital signs in flow sheet. Tolerated Procedure well. ?

## 2016-03-10 NOTE — Op Note (Signed)
Dictation Number dictated 03/10/2016 @ 11:18

## 2016-03-10 NOTE — Anesthesia Procedure Notes (Signed)
Procedure Name: LMA Insertion Date/Time: 03/10/2016 10:25 AM Performed by: Toula Moos L Pre-anesthesia Checklist: Patient identified, Emergency Drugs available, Suction available, Patient being monitored and Timeout performed Patient Re-evaluated:Patient Re-evaluated prior to inductionOxygen Delivery Method: Circle system utilized Preoxygenation: Pre-oxygenation with 100% oxygen Intubation Type: IV induction Ventilation: Mask ventilation without difficulty LMA: LMA inserted LMA Size: 4.0 Number of attempts: 1 Airway Equipment and Method: Bite block Placement Confirmation: positive ETCO2 Tube secured with: Tape Dental Injury: Teeth and Oropharynx as per pre-operative assessment

## 2016-03-10 NOTE — Anesthesia Procedure Notes (Addendum)
Anesthesia Regional Block:  Supraclavicular block  Pre-Anesthetic Checklist: ,, timeout performed, Correct Patient, Correct Site, Correct Laterality, Correct Procedure, Correct Position, site marked, Risks and benefits discussed,  Surgical consent,  Pre-op evaluation,  At surgeon's request and post-op pain management  Laterality: Left  Prep: chloraprep       Needles:  Injection technique: Single-shot  Needle Type: Echogenic Stimulator Needle     Needle Length: 9cm 9 cm Needle Gauge: 21 and 21 G  Needle insertion depth: 4 cm   Additional Needles:  Procedures: ultrasound guided (picture in chart) Supraclavicular block Narrative:  Start time: 03/10/2016 9:31 AM End time: 03/10/2016 9:37 AM Injection made incrementally with aspirations every 5 mL.  Performed by: Personally  Anesthesiologist: Josephine Igo  Additional Notes: Timeout performed. Patient sedated. Relevant anatomy ID'd using [place]. Incremental 31ml injection with frequent aspiration. Patient tolerated procedure well.

## 2016-03-10 NOTE — Anesthesia Postprocedure Evaluation (Signed)
Anesthesia Post Note  Patient: Kristina [name]  Procedure(s) Performed: Procedure(s) (LRB): CARPAL TUNNEL RELEASE, left (Left) ULNAR NERVE DECOMPRESSION possible TRANSPOSITION (Left)  Patient location during evaluation: PACU Anesthesia Type: General and Regional Level of consciousness: awake and alert Pain management: pain level controlled Vital Signs Assessment: post-procedure vital signs reviewed and stable Respiratory status: spontaneous breathing, nonlabored ventilation, respiratory function stable and patient connected to nasal cannula oxygen Cardiovascular status: blood pressure returned to baseline and stable Postop Assessment: no signs of nausea or vomiting Anesthetic complications: no    Last Vitals:  Vitals:   03/10/16 1156 03/10/16 1206  BP: 136/78 133/83  Pulse: 80 67  Resp: 18 16  Temp:  36.4 C    Last Pain:  Vitals:   03/10/16 1206  PainSc: 8                  Jayse Hodkinson S

## 2016-03-10 NOTE — Transfer of Care (Signed)
Immediate Anesthesia Transfer of Care Note  Patient: Kristina [name]  Procedure(s) Performed: Procedure(s): CARPAL TUNNEL RELEASE, left (Left) ULNAR NERVE DECOMPRESSION possible TRANSPOSITION (Left)  Patient Location: PACU  Anesthesia Type:GA combined with regional for post-op pain  Level of Consciousness: sedated and patient cooperative  Airway & Oxygen Therapy: Patient Spontanous Breathing and Patient connected to face mask oxygen  Post-op Assessment: Report given to RN and Post -op Vital signs reviewed and stable  Post vital signs: Reviewed and stable  Last Vitals:  Vitals:   03/10/16 0930 03/10/16 0945  BP: 140/80 138/86  Pulse: 71 84  Resp: 16 10  Temp: 36.6 C     Last Pain: There were no vitals filed for this visit.       Complications: No apparent anesthesia complications

## 2016-03-10 NOTE — Discharge Instructions (Addendum)

## 2016-03-10 NOTE — Anesthesia Preprocedure Evaluation (Signed)
Anesthesia Evaluation  Patient identified by MRN, date of birth, ID band Patient awake    Reviewed: Allergy & Precautions, NPO status , Patient's Chart, lab work & pertinent test results  Airway Mallampati: II  TM Distance: >3 FB Neck ROM: Full    Dental no notable dental hx. (+) Teeth Intact   Pulmonary asthma ,    Pulmonary exam normal breath sounds clear to auscultation       Cardiovascular hypertension, Pt. on medications Normal cardiovascular exam Rhythm:Regular Rate:Normal     Neuro/Psych Left carpal tunnel syndrome  Neuromuscular disease negative psych ROS   GI/Hepatic negative GI ROS, Neg liver ROS,   Endo/Other  Obesity  Renal/GU negative Renal ROS  negative genitourinary   Musculoskeletal  (+) Arthritis , Osteoarthritis,    Abdominal (+) + obese,   Peds  Hematology negative hematology ROS (+)   Anesthesia Other Findings   Reproductive/Obstetrics                             Anesthesia Physical Anesthesia Plan  ASA: II  Anesthesia Plan: General and Regional   Post-op Pain Management:  Regional for Post-op pain   Induction: Intravenous  Airway Management Planned: LMA  Additional Equipment:   Intra-op Plan:   Post-operative Plan:   Informed Consent: I have reviewed the patients History and Physical, chart, labs and discussed the procedure including the risks, benefits and alternatives for the proposed anesthesia with the patient or authorized representative who has indicated his/her understanding and acceptance.   Dental advisory given  Plan Discussed with: Anesthesiologist, CRNA and Surgeon  Anesthesia Plan Comments:         Anesthesia Quick Evaluation

## 2016-03-11 ENCOUNTER — Encounter (HOSPITAL_BASED_OUTPATIENT_CLINIC_OR_DEPARTMENT_OTHER): Payer: Self-pay | Admitting: Orthopedic Surgery

## 2016-03-11 NOTE — Addendum Note (Signed)
Addendum  created 03/11/16 1317 by Josephine Igo, MD   Anesthesia Intra Blocks edited, Sign clinical note

## 2016-03-11 NOTE — Addendum Note (Signed)
Addendum  created 03/11/16 1629 by Josephine Igo, MD   Anesthesia Intra Blocks edited, Sign clinical note

## 2016-03-11 NOTE — Op Note (Signed)
Kristina [name], Kristina [name] NO.:  [id number]  MEDICAL RECORD NO.:  [id number]  LOCATION:                                 FACILITY:  PHYSICIAN:  Daryll Brod, M.D.       DATE OF BIRTH:  [birth year]  DATE OF PROCEDURE:  03/10/2016 DATE OF DISCHARGE:                              OPERATIVE REPORT   PREOPERATIVE DIAGNOSIS:  Carpal tunnel syndrome, cubital tunnel syndrome, left arm and left hand.  POSTOPERATIVE DIAGNOSIS:  Carpal tunnel syndrome, cubital tunnel syndrome, left arm and left hand.  OPERATION:  Decompression of left median nerve at the wrist and decompression of left ulnar nerve at the elbow.  SURGEON:  Daryll Brod, MD.  ANESTHESIA:  Supraclavicular block, general.  ANESTHESIOLOGIST:  Yvetta Coder, MD.  HISTORY:  The patient is a 70 year old female with a history of numbness, tingling, and pain for left arm.  Nerve conductions are positive for carpal tunnel syndrome, cubital tunnel syndrome.  She has not responded to conservative treatment and has elected to undergo surgical decompression of each.  Pre, peri, and postoperative courses have been discussed along with risks and complications.  She is aware that there is no guarantee to the surgery; the possibility of infection; recurrence of injury to arteries, nerves, tendons; incomplete relief of symptoms; and dystrophy.  In the preoperative area, the patient is seen, the extremity marked by both patient and surgeon.  Antibiotic given.  PROCEDURE IN DETAIL:  The patient was brought to the operating room after a supraclavicular block was carried out without difficulty in the preoperative area.  She was prepped using ChloraPrep in supine position with the left arm free.  She was sedated.  The arm was prepped using ChloraPrep and a time-out taken confirming patient and procedure.  The limb was exsanguinated with an Esmarch bandage.  Tourniquet placed high on the arm was inflated to 250 mmHg.  A  longitudinal incision was made in the left palm, carried down through subcutaneous tissue.  Bleeders were electrocauterized with bipolar.  The palmar fascia was split and superficial palmar arch was identified.  Retractors were placed protecting the median nerve radially and the ulnar nerve ulnarly after identification of the flexor to the ring and small finger.  The flexor retinaculum was then incised with sharp dissection on its ulnar border. A right angle and Sewall retractors were placed between skin and forearm fascia proximally.  The nerve was dissected from the overlying forearm fascia.  The forearm fascia was then released for approximately 2 cm proximal to the wrist crease under direct vision.  The wound was explored.  No further lesions were identified.  The motor branch entered into muscle distally.  Area of compression to the nerve was apparent. No further lesions were noted.  The wound was copiously irrigated with saline.  The skin was closed with interrupted 4-0 nylon sutures.  A separate incision was then made over the medial epicondyle of the left elbow.  This was carried down through subcutaneous tissue.  Bleeders were again electrocauterized with bipolar.  Nerves were identified and protected.  Retractors were placed.  The dissection carried down to [facility] fascia.  This was released.  The ulnar nerve was identified. The ulnar nerve was then traced distally.  The fascia of the flexor carpi ulnaris was then separated from the overlying subcutaneous and skin.  A knee retractor was placed.  The superficial fascia of the flexor carpi ulnaris was then split with blunt scissors.  The muscle was then separated from dorsal palmar components.  A KMI guide for carpal tunnel release was then passed between the deep fascia and the nerve. ENT angled scissors were then used to cut the deep fascia under direct vision with protection to the ulnar nerve by the [facility] guide.  This was done  for approximately 6-8 cm distally.  Attention was then directed proximally.  The brachial fascia was then separated from the overlying subcutaneous tissue and skin.  The knee retractor was again placed.  The [facility] guide was then placed between the ulnar nerve and the fascia.  The fascia was then released under direct vision protecting the nerve with the [facility] guide.  This was done for approximately 8 cm proximally.  The elbow was flexed.  The ulnar nerve did not dislocate.  The wounds were copiously irrigated with saline.  The subcutaneous tissue was closed with interrupted 4-0 Vicryl suture.  The skin was closed with interrupted 4-0 nylon sutures.  A sterile compressive dressing to the hand and elbow was placed.  On deflation of the tourniquet, all fingers immediately pinked.  She was taken to the recovery room for observation in satisfactory condition.  She will be discharged home to return to the [place] in 1 week, on Norco.          ______________________________ Daryll Brod, M.D.     GK/MEDQ  D:  03/10/2016  T:  03/11/2016  Job:  [id number]

## 2016-04-11 DIAGNOSIS — I219 Acute myocardial infarction, unspecified: Secondary | ICD-10-CM

## 2016-04-11 HISTORY — PX: CARDIAC CATHETERIZATION: SHX172

## 2016-04-11 HISTORY — DX: Acute myocardial infarction, unspecified: I21.9

## 2016-06-22 ENCOUNTER — Inpatient Hospital Stay (HOSPITAL_COMMUNITY)
Admission: EM | Admit: 2016-06-22 | Discharge: 2016-06-24 | DRG: 247 | Disposition: A | Discharge status: Home / Self Care | Payer: 59 | Attending: Internal Medicine | Admitting: Internal Medicine

## 2016-06-22 ENCOUNTER — Encounter (HOSPITAL_COMMUNITY): Payer: Self-pay | Admitting: Emergency Medicine

## 2016-06-22 ENCOUNTER — Emergency Department (HOSPITAL_COMMUNITY): Payer: 59

## 2016-06-22 DIAGNOSIS — M1990 Unspecified osteoarthritis, unspecified site: Secondary | ICD-10-CM | POA: Diagnosis present

## 2016-06-22 DIAGNOSIS — I200 Unstable angina: Secondary | ICD-10-CM | POA: Diagnosis present

## 2016-06-22 DIAGNOSIS — R072 Precordial pain: Secondary | ICD-10-CM | POA: Diagnosis not present

## 2016-06-22 DIAGNOSIS — Z9861 Coronary angioplasty status: Secondary | ICD-10-CM

## 2016-06-22 DIAGNOSIS — I10 Essential (primary) hypertension: Secondary | ICD-10-CM | POA: Diagnosis not present

## 2016-06-22 DIAGNOSIS — I25110 Atherosclerotic heart disease of native coronary artery with unstable angina pectoris: Secondary | ICD-10-CM | POA: Diagnosis not present

## 2016-06-22 DIAGNOSIS — Z955 Presence of coronary angioplasty implant and graft: Secondary | ICD-10-CM

## 2016-06-22 DIAGNOSIS — I2510 Atherosclerotic heart disease of native coronary artery without angina pectoris: Secondary | ICD-10-CM

## 2016-06-22 DIAGNOSIS — R079 Chest pain, unspecified: Secondary | ICD-10-CM

## 2016-06-22 DIAGNOSIS — Z79899 Other long term (current) drug therapy: Secondary | ICD-10-CM

## 2016-06-22 DIAGNOSIS — Z8249 Family history of ischemic heart disease and other diseases of the circulatory system: Secondary | ICD-10-CM

## 2016-06-22 DIAGNOSIS — R739 Hyperglycemia, unspecified: Secondary | ICD-10-CM

## 2016-06-22 HISTORY — DX: Unspecified convulsions: R56.9

## 2016-06-22 HISTORY — DX: Unspecified asthma, uncomplicated: J45.909

## 2016-06-22 HISTORY — DX: Plantar fascial fibromatosis: M72.2

## 2016-06-22 LAB — CBC
HCT: 40.8 % (ref 36.0–46.0)
HEMATOCRIT: 38.3 % (ref 36.0–46.0)
HEMOGLOBIN: 13.1 g/dL (ref 12.0–15.0)
HEMOGLOBIN: 12.4 g/dL (ref 12.0–15.0)
MCH: 29.8 pg (ref 26.0–34.0)
MCH: 29.7 pg (ref 26.0–34.0)
MCHC: 32.1 g/dL (ref 30.0–36.0)
MCHC: 32.4 g/dL (ref 30.0–36.0)
MCV: 92.9 fL (ref 78.0–100.0)
MCV: 91.6 fL (ref 78.0–100.0)
Platelets: 278 10*3/uL (ref 150–400)
Platelets: 276 10*3/uL (ref 150–400)
RBC: 4.39 MIL/uL (ref 3.87–5.11)
RBC: 4.18 MIL/uL (ref 3.87–5.11)
RDW: 14.6 % (ref 11.5–15.5)
RDW: 14.3 % (ref 11.5–15.5)
WBC: 9.5 10*3/uL (ref 4.0–10.5)
WBC: 9 10*3/uL (ref 4.0–10.5)

## 2016-06-22 LAB — BASIC METABOLIC PANEL
Anion gap: 11 (ref 5–15)
BUN: 11 mg/dL (ref 6–20)
CO2: 26 mmol/L (ref 22–32)
CREATININE: 1.03 mg/dL — AB (ref 0.44–1.00)
Calcium: 9.1 mg/dL (ref 8.9–10.3)
Chloride: 101 mmol/L (ref 101–111)
GFR, EST NON AFRICAN AMERICAN: 55 mL/min — AB (ref >60)
Glucose, Bld: 210 mg/dL — ABNORMAL HIGH (ref 65–99)
POTASSIUM: 4 mmol/L (ref 3.5–5.1)
SODIUM: 138 mmol/L (ref 135–145)

## 2016-06-22 LAB — TROPONIN I
Troponin I: 0.04 ng/mL (ref <0.03)
Troponin I: 0.05 ng/mL (ref <0.03)

## 2016-06-22 LAB — CREATININE, SERUM: Creatinine, Ser: 0.95 mg/dL (ref 0.44–1.00)

## 2016-06-22 LAB — PROTIME-INR
INR: 1.02
Prothrombin Time: 13.4 seconds (ref 11.4–15.2)

## 2016-06-22 LAB — I-STAT TROPONIN, ED: TROPONIN I, POC: 0 ng/mL (ref 0.00–0.08)

## 2016-06-22 MED ORDER — NITROGLYCERIN IN D5W 200-5 MCG/ML-% IV SOLN
0.0000 ug/min | INTRAVENOUS | Status: DC
Start: 2016-06-22 — End: 2016-06-23
  Administered 2016-06-22: 5 ug/min via INTRAVENOUS
  Filled 2016-06-22: qty 250

## 2016-06-22 MED ORDER — ONDANSETRON HCL 4 MG/2ML IJ SOLN
4.0000 mg | Freq: Four times a day (QID) | INTRAMUSCULAR | Status: DC | PRN
  Administered 2016-06-23: 17:00:00 4 mg via INTRAVENOUS
  Filled 2016-06-22: qty 2

## 2016-06-22 MED ORDER — ACETAMINOPHEN 325 MG PO TABS: 650.0000 mg | ORAL_TABLET | ORAL | Status: DC | PRN

## 2016-06-22 MED ORDER — HEPARIN BOLUS VIA INFUSION
4000.0000 [IU] | Freq: Once | INTRAVENOUS | Status: AC
  Administered 2016-06-22: 4000 [IU] via INTRAVENOUS
  Filled 2016-06-22: qty 4000

## 2016-06-22 MED ORDER — ASPIRIN 81 MG PO CHEW
324.0000 mg | CHEWABLE_TABLET | Freq: Once | ORAL | Status: AC
  Administered 2016-06-22: 324 mg via ORAL
  Filled 2016-06-22: qty 4

## 2016-06-22 MED ORDER — AMLODIPINE BESYLATE 5 MG PO TABS
5.0000 mg | ORAL_TABLET | Freq: Every day | ORAL | Status: DC
  Administered 2016-06-23 – 2016-06-24 (×2): 5 mg via ORAL
  Filled 2016-06-22 (×2): qty 1

## 2016-06-22 MED ORDER — ENOXAPARIN SODIUM 40 MG/0.4ML ~~LOC~~ SOLN: 40.0000 mg | SUBCUTANEOUS | Status: DC

## 2016-06-22 MED ORDER — LOSARTAN POTASSIUM 50 MG PO TABS
100.0000 mg | ORAL_TABLET | Freq: Every day | ORAL | Status: DC
  Administered 2016-06-23 – 2016-06-24 (×2): 100 mg via ORAL
  Filled 2016-06-22 (×2): qty 2

## 2016-06-22 MED ORDER — FUROSEMIDE 20 MG PO TABS
20.0000 mg | ORAL_TABLET | Freq: Every day | ORAL | Status: DC
  Administered 2016-06-24: 10:00:00 20 mg via ORAL
  Filled 2016-06-22 (×2): qty 1

## 2016-06-22 MED ORDER — HEPARIN (PORCINE) IN NACL 100-0.45 UNIT/ML-% IJ SOLN
900.0000 [IU]/h | INTRAMUSCULAR | Status: DC
  Administered 2016-06-22: 900 [IU]/h via INTRAVENOUS
  Filled 2016-06-22 (×2): qty 250

## 2016-06-22 NOTE — Consult Note (Signed)
Cardiology Consult    Patient ID: Kristina [name] MRN: [id number], DOB/AGE: 08/28/1945   Admit date: 06/22/2016 Date of Consult: 06/22/2016  Primary Physician: [name], MD Primary Cardiologist: new - Dr. Gwenlyn Found  Requesting Provider: Dr. Maylene Roes  Reason for Consult: chest pain  Patient Profile    Kristina [name] is a 71 yo female with a PMH significant for HTN, asthma, and arthritis presents to the ED with 3 week history of chest pain. Cardiology was consulted for ACS evaluation.  Past Medical History   Past Medical History:  Diagnosis Date  . Arthritis   . Asthma    as child  . Carpal tunnel syndrome   . Hypertension   . Wears glasses     Past Surgical History:  Procedure Laterality Date  . ABDOMINAL HYSTERECTOMY    . CARPAL TUNNEL RELEASE Left 03/10/2016   Procedure: CARPAL TUNNEL RELEASE, left;  Surgeon: Daryll Brod, MD;  Location: [place];  Service: Orthopedics;  Laterality: Left;  . COLONOSCOPY    . ORIF WRIST FRACTURE  10/11   left  . TRIGGER FINGER RELEASE Right 02/27/2013   Procedure: RELEASE TRIGGER RIGHT MIDDLE FINGER ;  Surgeon: Wynonia Sours, MD;  Location: [place];  Service: Orthopedics;  Laterality: Right;  . ULNAR NERVE TRANSPOSITION Left 03/10/2016   Procedure: ULNAR NERVE DECOMPRESSION possible TRANSPOSITION;  Surgeon: Daryll Brod, MD;  Location: [place];  Service: Orthopedics;  Laterality: Left;     Allergies  No Known Allergies  History of Present Illness    Kristina [name] presented to the [facility] with a 3-week history of chest pain that feels "like an elephant sitting on my chest." The pain is associated with dyspnea, but no nausea or diaphoresis. The pain is exertional, occurring when she walks across the street. The chest pain is relieved with rest. The chest pain that occurs now with exertion has been more intense, prompting her to report to the Ed. She does not currently see a cardiologist and states  she has never had a stress test or ACS evaluation.   Upon arrival to the ED intitial troponin was negative and EKG was without ST-T changes. No nitro has been given. On my interview, the patient states her HTN has been well-controlled on her current regimen until about 6 weeks ago. Her PCP increased her losartan and added 20 mg lasix daily for swelling in her feet. The pt denies echocardiogram and echo not found in EPIC. She states the chest pain has worsened in intensity and has prevented her from sleeping. After calling her PCP again, she was prescribed a prednisone taper; the chest pain and dyspnea were thought to be related to her asthma. She states prescribed inhalers did not help her chest pain or shortness of breath, but the prednisone did help her sleep at night by relieving her chest pain and dyspnea. In addition to exertional chest pain and dyspnea, she also has chest pain and dyspnea when trying to sleep at night. She denies recent illness and sick contacts.  Of note, her mother died of a stroke/MI at age 48.    Inpatient Medications       Outpatient Medications    Prior to Admission medications   Medication Sig Start Date End Date Taking? Authorizing Provider  amLODipine (NORVASC) 5 MG tablet Take 5 mg by mouth daily.   Yes Historical Provider, MD  Cholecalciferol (VITAMIN D PO) Take 1 capsule by mouth daily.  Yes Historical Provider, MD  furosemide (LASIX) 20 MG tablet Take 20 mg by mouth daily.  10/09/14  Yes Historical Provider, MD  losartan (COZAAR) 100 MG tablet Take 100 mg by mouth daily.    Yes Historical Provider, MD  predniSONE (DELTASONE) 20 MG tablet Take 20-40 mg by mouth See admin instructions. Take 40 mg by mouth daily for 3 days, then take 20 mg by mouth daily for 4 days 06/21/16  Yes Historical Provider, MD     Family History     Family History  Problem Relation Age of Onset  . Stroke Mother 46  . Heart attack Mother 11  . Lung cancer Father   . Lung cancer  Brother     Social History    Social History   Social History  . Marital status: Single    Spouse name: N/A  . Number of children: N/A  . Years of education: N/A   Occupational History  . Not on file.   Social History Main Topics  . Smoking status: Never Smoker  . Smokeless tobacco: Never Used  . Alcohol use Yes     Comment: socially  . Drug use: No  . Sexual activity: Not on file   Other Topics Concern  . Not on file   Social History Narrative  . No narrative on file     Review of Systems    General:  No chills, fever, night sweats or weight changes.  Cardiovascular:  Positive for chest pain and dyspnea on exertion, edema, No orthopnea, palpitations, paroxysmal nocturnal  dyspnea. Dermatological: No rash, lesions/masses Respiratory: No cough, Positive for dyspnea Urologic: No hematuria, dysuria Abdominal:   No nausea, vomiting, diarrhea, bright red blood per rectum, melena, or hematemesis Neurologic:  No visual changes, wkns, changes in mental status. All other systems reviewed and are otherwise negative except as noted above.  Physical Exam    Blood pressure 138/81, pulse 75, temperature 98.8 F (37.1 C), temperature source Oral, resp. rate 15, height 5\' 5"  (1.651 m), weight 205 lb (93 kg), SpO2 95 %.  General: Pleasant, NAD Psych: Normal affect. Neuro: Alert and oriented X 3. Moves all extremities spontaneously. HEENT: Normal  Neck: Supple without bruits or JVD. Lungs:  Resp regular and unlabored, CTA. Heart: RRR no s3, s4, or murmurs. Abdomen: Soft, non-tender, non-distended, BS + x 4.  Extremities: No clubbing, cyanosis or edema. DP/PT/Radials 2+ and equal bilaterally.  Labs    Troponin [facility] Test)  Recent Labs  06/22/16 1449  TROPIPOC 0.00   No results for input(s): CKTOTAL, CKMB, TROPONINI in the last 72 hours. Lab Results  Component Value Date   WBC 9.5 06/22/2016   HGB 13.1 06/22/2016   HCT 40.8 06/22/2016   MCV 92.9 06/22/2016     PLT 278 06/22/2016    Recent Labs Lab 06/22/16 1429  NA 138  K 4.0  CL 101  CO2 26  BUN 11  CREATININE 1.03*  CALCIUM 9.1  GLUCOSE 210*   Lab Results  Component Value Date   CHOL  01/17/2010    189 (NOTE) ATP III Classification:      < 200        mg/dL        Desirable     200 - 239     mg/dL        Borderline High     >= 240        mg/dL  High    HDL 41 01/17/2010   LDLCALC (H) 01/17/2010    118 (NOTE)  Total Cholesterol/HDL Ratio:CHD Risk                       Coronary Heart Disease Risk Table                                       Men       Women         1/2 Average Risk              3.4        3.3             Average Risk              5.0         4.4         2 X Average Risk              9.6        7.1         3 X Average Risk             23.4       11.0 Use the calculated Patient Ratio above and the CHD Risk table  to determine the patient's CHD Risk. ATP III Classification (LDL):      < 100         mg/dL         Optimal     100 - 129     mg/dL         Near or Above Optimal     130 - 159     mg/dL         Borderline High     160 - 189     mg/dL         High      > 190        mg/dL         Very High    TRIG 149 01/17/2010   Lab Results  Component Value Date   DDIMER (H) 01/16/2010    1.65        AT THE INHOUSE ESTABLISHED CUTOFF VALUE OF 0.48 ug/mL FEU, THIS ASSAY HAS BEEN DOCUMENTED IN THE LITERATURE TO HAVE A SENSITIVITY AND NEGATIVE PREDICTIVE VALUE OF AT LEAST 98 TO 99%.  THE TEST RESULT SHOULD BE CORRELATED WITH AN ASSESSMENT OF THE CLINICAL PROBABILITY OF DVT / VTE.     Radiology Studies    Dg Chest 2 View  Result Date: 06/22/2016 CLINICAL DATA:  Chest pain and pressure EXAM: CHEST  2 VIEW COMPARISON:  None. FINDINGS: Normal mediastinum and cardiac silhouette. Normal pulmonary vasculature. No evidence of effusion, infiltrate, or pneumothorax. No acute bony abnormality. IMPRESSION: No acute cardiopulmonary process. Electronically Signed   By: Suzy Bouchard M.D.   On: 06/22/2016 14:59    ECG & Cardiac Imaging    EKG 06/22/16: sinus tachycardia, ventricular rate 101, no ST-T changes, Q waves in Lead I and AVL   Echocardiogram 01/18/10: Study Conclusions - Procedure narrative: Transthoracic echocardiography. Image quality  was poor. The study was technically difficult, as a result of poor  sound wave transmission. - Left ventricle: The cavity size was normal. Systolic function was  normal. The estimated ejection fraction was in the range  of 55% to  60%. Regional wall motion abnormalities cannot be excluded.  Doppler parameters are consistent with abnormal left ventricular  relaxation (grade 1 diastolic dysfunction).   Assessment & Plan    1. Chest pain - pt presents with exertional chest pressure that is relieved with rest,, radiates to her back, and is associated with dyspnea - troponin x 1 negative - EKG without ischemic changes - home medications include: lasix 20 mg daily, norvasc, and losartan; she has been compliant on these medications - given her symptoms, this sounds like anginal chest pain. In consultation with Dr. Gwenlyn Found, we will prepare her for heart catheterization tomorrow. Recommend starting heparin gtt and nitro gtt 71mcg/hr for pressure and pain control. Recommend admit to step-down unit and telemetry.   2. HTN - continue home medications - BP slightly elevated in the ED: 130-160s/80-90s - nitro gtt will help with BP    Signed, Ledora Bottcher, PA-C 06/22/2016, 4:28 PM   Agree with note by Delia Heady  We are asked to see Kristina [name] by Dr. Maylene Roes for evaluation of chest pain. I agree with assessment and plan as written by Doreene Adas PA-C. Kristina [name] is a 71 year old moderately overweight divorced African-American female without prior cardiac history. She does have a history of hypertension poorly controlled as well as lower extremity edema. She had an echo performed 2011 which was  normal in the setting of chest pain. She does walk 3-4 miles a day. There is a history of childhood asthma. Over the last several weeks she's had increasing dyspnea on exertion and substernal chest pressure with exertion. She saw her PCP for follow-up of this was reactive airways disease and treated her with steroids. She presents today with worsening chest pain. Her exam is benign. EKG shows no acute changes. Her enzymes are negative. Her labs are otherwise unremarkable. Chest x-ray is clear. I'm concerned about the nature of her symptoms. I recommended that we proceed with cardiac catheterization tomorrow to define her anatomy. I've also recommended beginning IV heparin per pharmacy and IV nitroglycerin to titrate to blood pressure and chest pain. The patient understands that risks included but are not limited to stroke (1 in 1000), death (1 in 44), kidney failure [usually temporary] (1 in 500), bleeding (1 in 200), allergic reaction [possibly serious] (1 in 200). The patient understands and agrees to proceed  [name], M.D., [place], [facility], [place], [place] [address]. [place], [place]  [address]  [phone] 06/22/2016 5:29 PM

## 2016-06-22 NOTE — ED Notes (Signed)
Unsuccessful attempt at giving report to 3W.   

## 2016-06-22 NOTE — ED Provider Notes (Signed)
[facility] DEPT Provider Note   CSN: [id number] Arrival date & time: 06/22/16  1414     History   Chief Complaint Chief Complaint  Patient presents with  . Chest Pain    HPI Kristina [name] is a 71 y.o. female.  She has been having chest discomfort for the last 3 weeks. She describes it like an elephant sitting on her chest and is associated with dyspnea. There is also occasional diaphoresis but no nausea. Symptoms come on with exertion and go away with rest. There have been no nocturnal symptoms. She states that after walking across the street, she feels like she can't breathe and feels like there is an elephant on her chest. Within a few minutes of stopping, there are heavy feeling is weighing her breathing gets back to normal. The amount of exertion required to bring on symptoms has not increased, but the symptoms are more intense when they do come on. She is a nonsmoker. There is history of hypertension but no history of diabetes or hyperlipidemia. Mother died of a heart attack at age 33.   The history is provided by the patient.  Chest Pain      Past Medical History:  Diagnosis Date  . Arthritis   . Asthma    as child  . Carpal tunnel syndrome   . Hypertension   . Wears glasses     There are no active problems to display for this patient.   Past Surgical History:  Procedure Laterality Date  . ABDOMINAL HYSTERECTOMY    . CARPAL TUNNEL RELEASE Left 03/10/2016   Procedure: CARPAL TUNNEL RELEASE, left;  Surgeon: Daryll Brod, MD;  Location: [place];  Service: Orthopedics;  Laterality: Left;  . COLONOSCOPY    . ORIF WRIST FRACTURE  10/11   left  . TRIGGER FINGER RELEASE Right 02/27/2013   Procedure: RELEASE TRIGGER RIGHT MIDDLE FINGER ;  Surgeon: Wynonia Sours, MD;  Location: [place];  Service: Orthopedics;  Laterality: Right;  . ULNAR NERVE TRANSPOSITION Left 03/10/2016   Procedure: ULNAR NERVE DECOMPRESSION possible  TRANSPOSITION;  Surgeon: Daryll Brod, MD;  Location: [place];  Service: Orthopedics;  Laterality: Left;    OB History    No data available       Home Medications    Prior to Admission medications   Medication Sig Start Date End Date Taking? Authorizing Provider  amLODipine (NORVASC) 5 MG tablet Take 5 mg by mouth daily.    Historical Provider, MD  furosemide (LASIX) 20 MG tablet  10/09/14   Historical Provider, MD  HYDROcodone-acetaminophen (NORCO) 5-325 MG tablet Take 1 tablet by mouth every 6 (six) hours as needed for moderate pain. 03/10/16   Daryll Brod, MD  losartan (COZAAR) 50 MG tablet Take 50 mg by mouth daily.    Historical Provider, MD  Vitamin D, Ergocalciferol, (DRISDOL) 50000 UNITS CAPS capsule  08/26/14   Historical Provider, MD    Family History No family history on file.  Social History Social History  Substance Use Topics  . Smoking status: Never Smoker  . Smokeless tobacco: Never Used  . Alcohol use Yes     Comment: socially     Allergies   Patient has no known allergies.   Review of Systems Review of Systems  Cardiovascular: Positive for chest pain.  All other systems reviewed and are negative.    Physical Exam Updated Vital Signs BP 163/99 (BP Location: Right Arm)   Pulse 99  Temp 98.8 F (37.1 C) (Oral)   Resp 16   Ht 5\' 5"  (1.651 m)   Wt 205 lb (93 kg)   SpO2 97%   BMI 34.11 kg/m   Physical Exam  Nursing note and vitals reviewed.  71 year old female, resting comfortably and in no acute distress. Vital signs are significant for hypertension. Oxygen saturation is 97%, which is normal. Head is normocephalic and atraumatic. PERRLA, EOMI. Oropharynx is clear. Neck is nontender and supple without adenopathy or JVD. Back is nontender and there is no CVA tenderness. Lungs are clear without rales, wheezes, or rhonchi. Chest is nontender. Heart has regular rate and rhythm without murmur. Abdomen is soft, flat, nontender  without masses or hepatosplenomegaly and peristalsis is normoactive. Extremities have trace edema, full range of motion is present. Skin is warm and dry without rash. Neurologic: Mental status is normal, cranial nerves are intact, there are no motor or sensory deficits.  ED Treatments / Results  Labs (all labs ordered are listed, but only abnormal results are displayed) Labs Reviewed  BASIC METABOLIC PANEL - Abnormal; Notable for the following:       Result Value   Glucose, Bld 210 (*)    Creatinine, Ser 1.03 (*)    GFR calc non Af Amer 55 (*)    All other components within normal limits  CBC  I-STAT TROPOININ, ED    EKG  EKG Interpretation  Date/Time:  Wednesday June 22 2016 14:20:21 EDT Ventricular Rate:  101 PR Interval:  154 QRS Duration: 88 QT Interval:  358 QTC Calculation: 464 R Axis:   -10 Text Interpretation:  Sinus tachycardia Otherwise normal ECG When compared with ECG of 03/09/2016, HEART RATE has increased Confirmed by Roxanne Mins  MD, Yosef Krogh ([id number]) on 06/22/2016 2:59:38 PM       Radiology Dg Chest 2 View  Result Date: 06/22/2016 CLINICAL DATA:  Chest pain and pressure EXAM: CHEST  2 VIEW COMPARISON:  None. FINDINGS: Normal mediastinum and cardiac silhouette. Normal pulmonary vasculature. No evidence of effusion, infiltrate, or pneumothorax. No acute bony abnormality. IMPRESSION: No acute cardiopulmonary process. Electronically Signed   By: Suzy Bouchard M.D.   On: 06/22/2016 14:59    Procedures Procedures (including critical care time)  Medications Ordered in ED Medications  aspirin chewable tablet 324 mg (324 mg Oral Given 06/22/16 1518)     Initial Impression / Assessment and Plan / ED Course  I have reviewed the triage vital signs and the nursing notes.  Pertinent labs & imaging results that were available during my care of the patient were reviewed by me and considered in my medical decision making (see chart for details).  Chest discomfort  extremely worrisome for angina. Heart score=5, which puts her at moderate risk for major adverse cardiac event in the next 30 days. Initial troponin is normal and ECG shows no acute ST or T changes. She does have hyperglycemia which is new for her and may indicate new onset type 2 diabetes. Given her highly suspicious history, I feel she should be admitted and have stress testing done as soon as feasible. Of note, she states that she has seen her physician during the past 3 weeks to one and to get a test, but insurance had denied it. She is given aspirin. Case has been discussed with Anderson Malta, [place] working with Dr. Marily Memos of [facility] hospital[facility], who agrees to admit the patient under observation status.  Final Clinical Impressions(s) / ED Diagnoses   Final diagnoses:  Exertional  chest pain  Essential hypertension  Hyperglycemia    New Prescriptions New Prescriptions   No medications on file     Delora Fuel, MD 64/29/03 [id number]

## 2016-06-22 NOTE — ED Triage Notes (Signed)
Pt states she has had constant substernal chest pressure for the last 2 days but today while driving to work it became worse and felt like an elephant siting on her chest and feels hard to take a breath.

## 2016-06-22 NOTE — H&P (Signed)
History and Physical    Kristina [name] IZT:[id number] DOB: [birth year] DOA: 06/22/2016  PCP: Kristina Fendt, MD  Patient coming from: Home  Chief Complaint: chest pain   HPI: Kristina [name] is a 71 y.o. female with medical history significant of HTN who presents with exertional chest pressure. She states that she was in normal state of health about 3 weeks ago. She notes that since then, she has had generalized chest pressure along with shortness of breath with minimal exertion, even walking from bedroom to the kitchen. Normally, she had been able to walk 2-3 miles daily. Chest pressure and shortness of breath resolves with rest. She also admits to some lightheadedness associated with her symptoms. No nausea, vomiting, diarrhea, abdominal pain or other complaints. Currently, she is chest pain free. She also admits to some indigestion which relieves with Alka-seltzer, but this symptoms is unrelated to the chest pressure with exertion. She has never had any cardiac work up in the past other than an echocardiogram in remote past.   ED Course: Labs obtained with EKG. Aspirin given. TRH called for further work up and observation.   Review of Systems: As per HPI otherwise 10 point review of systems negative.   Past Medical History:  Diagnosis Date  . Arthritis   . Asthma    as child  . Carpal tunnel syndrome   . Hypertension   . Wears glasses     Past Surgical History:  Procedure Laterality Date  . ABDOMINAL HYSTERECTOMY    . CARPAL TUNNEL RELEASE Left 03/10/2016   Procedure: CARPAL TUNNEL RELEASE, left;  Surgeon: Kristina Brod, MD;  Location: [facility];  Service: Orthopedics;  Laterality: Left;  . COLONOSCOPY    . ORIF WRIST FRACTURE  10/11   left  . TRIGGER FINGER RELEASE Right 02/27/2013   Procedure: RELEASE TRIGGER RIGHT MIDDLE FINGER ;  Surgeon: Kristina Sours, MD;  Location: [facility];  Service: Orthopedics;  Laterality: Right;  . ULNAR NERVE  TRANSPOSITION Left 03/10/2016   Procedure: ULNAR NERVE DECOMPRESSION possible TRANSPOSITION;  Surgeon: Kristina Brod, MD;  Location: [facility];  Service: Orthopedics;  Laterality: Left;     reports that she has never smoked. She has never used smokeless tobacco. She reports that she drinks alcohol. She reports that she does not use drugs.  No Known Allergies  Family History  Problem Relation Age of Onset  . Stroke Mother 58  . Heart attack Mother 42  . Lung cancer Father   . Lung cancer Brother     Prior to Admission medications   Medication Sig Start Date End Date Taking? Authorizing Provider  amLODipine (NORVASC) 5 MG tablet Take 5 mg by mouth daily.   Yes Historical Provider, MD  Cholecalciferol (VITAMIN D PO) Take 1 capsule by mouth daily.   Yes Historical Provider, MD  furosemide (LASIX) 20 MG tablet Take 20 mg by mouth daily.  10/09/14  Yes Historical Provider, MD  losartan (COZAAR) 100 MG tablet Take 100 mg by mouth daily.    Yes Historical Provider, MD  predniSONE (DELTASONE) 20 MG tablet Take 20-40 mg by mouth See admin instructions. Take 40 mg by mouth daily for 3 days, then take 20 mg by mouth daily for 4 days 06/21/16  Yes Historical Provider, MD  HYDROcodone-acetaminophen (NORCO) 5-325 MG tablet Take 1 tablet by mouth every 6 (six) hours as needed for moderate pain. Patient not taking: Reported on 06/22/2016 03/10/16   Kristina Brod,  MD    Physical Exam: Vitals:   06/22/16 1509 06/22/16 1515 06/22/16 1530 06/22/16 1545  BP:  140/91 155/95 138/81  Pulse: 97 94 90 75  Resp: 19 22 19 15   Temp:      TempSrc:      SpO2: 97% 96% 97% 95%  Weight:      Height:        Constitutional: NAD, calm, comfortable Eyes: PERRL, lids and conjunctivae normal ENMT: Mucous membranes are moist. Posterior pharynx clear of any exudate or lesions.Normal dentition.  Neck: normal, supple, no masses, no thyromegaly Respiratory: clear to auscultation bilaterally, no wheezing, no  crackles. Normal respiratory effort. No accessory muscle use.  Cardiovascular: Regular rate and rhythm, no murmurs / rubs / gallops. No extremity edema.  Abdomen: no tenderness, no masses palpated. No hepatosplenomegaly. Bowel sounds positive.  Musculoskeletal: no clubbing / cyanosis. No joint deformity upper and lower extremities. Good ROM, no contractures. Normal muscle tone.  Skin: no rashes, lesions, ulcers. No induration Neurologic: CN 2-12 grossly intact. Sensation intact, DTR normal. Strength 5/5 in all 4.  Psychiatric: Normal judgment and insight. Alert and oriented x 3. Normal mood. Tearful   Labs on Admission: I have personally reviewed following labs and imaging studies  CBC:  Recent Labs Lab 06/22/16 1429  WBC 9.5  HGB 13.1  HCT 40.8  MCV 92.9  PLT 585   Basic Metabolic Panel:  Recent Labs Lab 06/22/16 1429  NA 138  K 4.0  CL 101  CO2 26  GLUCOSE 210*  BUN 11  CREATININE 1.03*  CALCIUM 9.1   GFR: Estimated Creatinine Clearance: 58.9 mL/min (by C-G formula based on SCr of 1.03 mg/dL (H)). Liver Function Tests: No results for input(s): AST, ALT, ALKPHOS, BILITOT, PROT, ALBUMIN in the last 168 hours. No results for input(s): LIPASE, AMYLASE in the last 168 hours. No results for input(s): AMMONIA in the last 168 hours. Coagulation Profile: No results for input(s): INR, PROTIME in the last 168 hours. Cardiac Enzymes: No results for input(s): CKTOTAL, CKMB, CKMBINDEX, TROPONINI in the last 168 hours. BNP (last 3 results) No results for input(s): PROBNP in the last 8760 hours. HbA1C: No results for input(s): HGBA1C in the last 72 hours. CBG: No results for input(s): GLUCAP in the last 168 hours. Lipid Profile: No results for input(s): CHOL, HDL, LDLCALC, TRIG, CHOLHDL, LDLDIRECT in the last 72 hours. Thyroid Function Tests: No results for input(s): TSH, T4TOTAL, FREET4, T3FREE, THYROIDAB in the last 72 hours. Anemia Panel: No results for input(s):  VITAMINB12, FOLATE, FERRITIN, TIBC, IRON, RETICCTPCT in the last 72 hours. Urine analysis:    Component Value Date/Time   COLORURINE YELLOW 03/26/2011 0922   APPEARANCEUR CLEAR 03/26/2011 0922   LABSPEC 1.006 03/26/2011 0922   PHURINE 7.5 03/26/2011 0922   GLUCOSEU NEGATIVE 03/26/2011 0922   HGBUR NEGATIVE 03/26/2011 0922   BILIRUBINUR NEGATIVE 03/26/2011 0922   KETONESUR NEGATIVE 03/26/2011 0922   PROTEINUR 30 (A) 03/26/2011 0922   UROBILINOGEN 0.2 03/26/2011 0922   NITRITE NEGATIVE 03/26/2011 0922   LEUKOCYTESUR MODERATE (A) 03/26/2011 0922   Sepsis Labs: !!!!!!!!!!!!!!!!!!!!!!!!!!!!!!!!!!!!!!!!!!!! @LABRCNTIP (procalcitonin:4,lacticidven:4) )No results found for this or any previous visit (from the past 240 hour(s)).   Radiological Exams on Admission: Dg Chest 2 View  Result Date: 06/22/2016 CLINICAL DATA:  Chest pain and pressure EXAM: CHEST  2 VIEW COMPARISON:  None. FINDINGS: Normal mediastinum and cardiac silhouette. Normal pulmonary vasculature. No evidence of effusion, infiltrate, or pneumothorax. No acute bony abnormality. IMPRESSION: No acute cardiopulmonary  process. Electronically Signed   By: Suzy Bouchard M.D.   On: 06/22/2016 14:59   EKG: Independently reviewed. Normal sinus rhythm with rate 101, no significant ST-T changes.   Assessment/Plan Principal Problem:   Chest pain Active Problems:   HTN (hypertension)  Exertional chest pain -Initial troponin negative, trend -Initial EKG with normal sinus rhythm with rate 101, no significant ST-T changes -Echo in 01/18/2010 with EF 18-29%, grade 1 diastolic dysfunction  -Heart score 5 -Cardiology consult    -Aspirin given in ED  Essential HTN -Continue norvasc, cozaar, lasix     DVT prophylaxis: lovenox Code Status: full  Family Communication: no family at bedside  Disposition Plan: pending further work up [employer] called: cardiology   Admission status: observation   It is my clinical opinion that  referral for OBSERVATION is reasonable and necessary in this 71 y.o. year old female  presenting with symptoms of chest pain, concerning for ACS  in the context of PMH including HTN  and pertinent radiographic and laboratory data including negative EKG and troponin  The aforementioned taken together are felt to place the patient at high risk for further  clinical deterioration. However it is anticipated that the patient may be medically stable for discharge from the hospital within 24 to 48 hours.    Dessa Phi, DO [facility] [email or web address] Password [facility] 06/22/2016, 4:15 PM

## 2016-06-22 NOTE — Progress Notes (Signed)
ANTICOAGULATION CONSULT NOTE - Initial Consult  Pharmacy Consult for Heparin Indication: chest pain/ACS  No Known Allergies  Patient Measurements: Height: 5\' 5"  (165.1 cm) Weight: 205 lb (93 kg) IBW/kg (Calculated) : 57 Heparin Dosing Weight: 78 kg  Vital Signs: Temp: 98.8 F (37.1 C) (03/14 1422) Temp Source: Oral (03/14 1422) BP: 165/93 (03/14 1645) Pulse Rate: 78 (03/14 1645)  Labs:  Recent Labs  06/22/16 1429  HGB 13.1  HCT 40.8  PLT 278  CREATININE 1.03*    Estimated Creatinine Clearance: 58.9 mL/min (by C-G formula based on SCr of 1.03 mg/dL (H)).   Medical History: Past Medical History:  Diagnosis Date  . Arthritis   . Asthma    as child  . Carpal tunnel syndrome   . Hypertension   . Wears glasses     Medications:  Prescriptions Prior to Admission  Medication Sig Dispense Refill Last Dose  . amLODipine (NORVASC) 5 MG tablet Take 5 mg by mouth daily.   06/22/2016 at Unknown time  . Cholecalciferol (VITAMIN D PO) Take 1 capsule by mouth daily.   06/22/2016 at Unknown time  . furosemide (LASIX) 20 MG tablet Take 20 mg by mouth daily.    06/22/2016 at Unknown time  . losartan (COZAAR) 100 MG tablet Take 100 mg by mouth daily.    06/22/2016 at Unknown time  . predniSONE (DELTASONE) 20 MG tablet Take 20-40 mg by mouth See admin instructions. Take 40 mg by mouth daily for 3 days, then take 20 mg by mouth daily for 4 days   06/22/2016 at Unknown time   Scheduled:  . [START ON 06/23/2016] amLODipine  5 mg Oral Daily  . [START ON 06/23/2016] furosemide  20 mg Oral Daily  . [START ON 06/23/2016] losartan  100 mg Oral Daily   Infusions:  . heparin 900 Units/hr (06/22/16 1839)  . nitroGLYCERIN 5 mcg/min (06/22/16 1838)    Assessment: Patient is a 23 yof who presented 3/14 w/ chest pain. No AC prior to admission. Plan is likely for cath tomorrow.  Baseline CBC is within normal limits and no notes of bleeding.  Goal of Therapy:  Heparin level 0.3-0.7  units/ml Monitor platelets by anticoagulation protocol: Yes   Plan:  Give 4000 units bolus x 1 Start heparin infusion at 900 units/hr Check anti-Xa level in 6 hours and daily while on heparin Continue to monitor H&H and platelets  Follow up plans post-cath  Demetrius Charity, PharmD Acute Care Pharmacy Resident  Pager: [phone] 06/22/2016

## 2016-06-23 ENCOUNTER — Inpatient Hospital Stay (HOSPITAL_COMMUNITY)
Admission: EM | Disposition: A | Discharge status: Home / Self Care | Payer: Self-pay | Source: Home / Self Care | Attending: Internal Medicine

## 2016-06-23 DIAGNOSIS — R079 Chest pain, unspecified: Secondary | ICD-10-CM | POA: Diagnosis present

## 2016-06-23 DIAGNOSIS — Z9861 Coronary angioplasty status: Secondary | ICD-10-CM

## 2016-06-23 DIAGNOSIS — I10 Essential (primary) hypertension: Secondary | ICD-10-CM | POA: Diagnosis present

## 2016-06-23 DIAGNOSIS — I2510 Atherosclerotic heart disease of native coronary artery without angina pectoris: Secondary | ICD-10-CM

## 2016-06-23 DIAGNOSIS — I25110 Atherosclerotic heart disease of native coronary artery with unstable angina pectoris: Principal | ICD-10-CM

## 2016-06-23 DIAGNOSIS — I200 Unstable angina: Secondary | ICD-10-CM | POA: Diagnosis not present

## 2016-06-23 DIAGNOSIS — Z8249 Family history of ischemic heart disease and other diseases of the circulatory system: Secondary | ICD-10-CM | POA: Diagnosis not present

## 2016-06-23 DIAGNOSIS — Z79899 Other long term (current) drug therapy: Secondary | ICD-10-CM | POA: Diagnosis not present

## 2016-06-23 DIAGNOSIS — M1990 Unspecified osteoarthritis, unspecified site: Secondary | ICD-10-CM | POA: Diagnosis present

## 2016-06-23 HISTORY — PX: LEFT HEART CATH AND CORONARY ANGIOGRAPHY: CATH118249

## 2016-06-23 HISTORY — PX: CORONARY STENT INTERVENTION: CATH118234

## 2016-06-23 LAB — CBC
HCT: 36.7 % (ref 36.0–46.0)
HEMATOCRIT: 37.3 % (ref 36.0–46.0)
Hemoglobin: 11.7 g/dL — ABNORMAL LOW (ref 12.0–15.0)
Hemoglobin: 11.9 g/dL — ABNORMAL LOW (ref 12.0–15.0)
MCH: 29.5 pg (ref 26.0–34.0)
MCH: 29.4 pg (ref 26.0–34.0)
MCHC: 31.9 g/dL (ref 30.0–36.0)
MCHC: 31.9 g/dL (ref 30.0–36.0)
MCV: 92.2 fL (ref 78.0–100.0)
MCV: 92.6 fL (ref 78.0–100.0)
PLATELETS: 237 10*3/uL (ref 150–400)
Platelets: 240 10*3/uL (ref 150–400)
RBC: 4.03 MIL/uL (ref 3.87–5.11)
RBC: 3.98 MIL/uL (ref 3.87–5.11)
RDW: 14.5 % (ref 11.5–15.5)
RDW: 14.7 % (ref 11.5–15.5)
WBC: 9.6 10*3/uL (ref 4.0–10.5)
WBC: 9.7 10*3/uL (ref 4.0–10.5)

## 2016-06-23 LAB — PROTIME-INR
INR: 1.12
Prothrombin Time: 14.5 seconds (ref 11.4–15.2)

## 2016-06-23 LAB — BASIC METABOLIC PANEL
Anion gap: 8 (ref 5–15)
BUN: 12 mg/dL (ref 6–20)
CHLORIDE: 106 mmol/L (ref 101–111)
CO2: 28 mmol/L (ref 22–32)
Calcium: 8.7 mg/dL — ABNORMAL LOW (ref 8.9–10.3)
Creatinine, Ser: 0.91 mg/dL (ref 0.44–1.00)
GFR calc Af Amer: >60 mL/min (ref >60)
GFR calc non Af Amer: >60 mL/min (ref >60)
GLUCOSE: 87 mg/dL (ref 65–99)
Potassium: 3.8 mmol/L (ref 3.5–5.1)
Sodium: 142 mmol/L (ref 135–145)

## 2016-06-23 LAB — HEPARIN LEVEL (UNFRACTIONATED)
HEPARIN UNFRACTIONATED: 0.48 [IU]/mL (ref 0.30–0.70)
Heparin Unfractionated: 0.4 IU/mL (ref 0.30–0.70)

## 2016-06-23 LAB — POCT ACTIVATED CLOTTING TIME: Activated Clotting Time: 362 seconds

## 2016-06-23 LAB — TROPONIN I: TROPONIN I: 0.05 ng/mL — AB (ref <0.03)

## 2016-06-23 SURGERY — LEFT HEART CATH AND CORONARY ANGIOGRAPHY
Anesthesia: LOCAL

## 2016-06-23 MED ORDER — IOPAMIDOL (ISOVUE-370) INJECTION 76%
INTRAVENOUS | Status: AC
  Filled 2016-06-23: qty 200

## 2016-06-23 MED ORDER — SODIUM CHLORIDE 0.9% FLUSH
3.0000 mL | Freq: Two times a day (BID) | INTRAVENOUS | Status: DC
  Administered 2016-06-24 (×2): 3 mL via INTRAVENOUS

## 2016-06-23 MED ORDER — BIVALIRUDIN 250 MG IV SOLR
INTRAVENOUS | Status: AC
  Filled 2016-06-23: qty 250

## 2016-06-23 MED ORDER — SODIUM CHLORIDE 0.9 % IV SOLN: 250.0000 mL | INTRAVENOUS | Status: DC | PRN

## 2016-06-23 MED ORDER — IOPAMIDOL (ISOVUE-370) INJECTION 76%
INTRAVENOUS | Status: DC | PRN
  Administered 2016-06-23: 120 mL via INTRA_ARTERIAL

## 2016-06-23 MED ORDER — SODIUM CHLORIDE 0.9% FLUSH: 3.0000 mL | INTRAVENOUS | Status: DC | PRN

## 2016-06-23 MED ORDER — SODIUM CHLORIDE 0.9 % WEIGHT BASED INFUSION
3.0000 mL/kg/h | INTRAVENOUS | Status: DC
  Administered 2016-06-23: 3 mL/kg/h via INTRAVENOUS

## 2016-06-23 MED ORDER — MIDAZOLAM HCL 2 MG/2ML IJ SOLN
INTRAMUSCULAR | Status: AC
  Filled 2016-06-23: qty 2

## 2016-06-23 MED ORDER — NITROGLYCERIN 1 MG/10 ML FOR IR/CATH LAB
INTRA_ARTERIAL | Status: AC
  Filled 2016-06-23: qty 10

## 2016-06-23 MED ORDER — MORPHINE SULFATE (PF) 2 MG/ML IV SOLN
INTRAVENOUS | Status: AC
  Administered 2016-06-23: 2 mg via INTRAMUSCULAR
  Filled 2016-06-23: qty 1

## 2016-06-23 MED ORDER — NITROGLYCERIN 1 MG/10 ML FOR IR/CATH LAB
INTRA_ARTERIAL | Status: DC | PRN
  Administered 2016-06-23: 200 ug via INTRACORONARY

## 2016-06-23 MED ORDER — TICAGRELOR 90 MG PO TABS
90.0000 mg | ORAL_TABLET | Freq: Two times a day (BID) | ORAL | Status: DC
  Administered 2016-06-24 (×2): 90 mg via ORAL
  Filled 2016-06-23 (×2): qty 1

## 2016-06-23 MED ORDER — ASPIRIN 81 MG PO CHEW
81.0000 mg | CHEWABLE_TABLET | Freq: Every day | ORAL | Status: DC
  Administered 2016-06-24: 81 mg via ORAL
  Filled 2016-06-23: qty 1

## 2016-06-23 MED ORDER — HYDRALAZINE HCL 20 MG/ML IJ SOLN: 5.0000 mg | INTRAMUSCULAR | Status: AC | PRN

## 2016-06-23 MED ORDER — ASPIRIN 81 MG PO CHEW
81.0000 mg | CHEWABLE_TABLET | ORAL | Status: AC
  Administered 2016-06-23: 81 mg via ORAL
  Filled 2016-06-23: qty 1

## 2016-06-23 MED ORDER — HEPARIN (PORCINE) IN NACL 2-0.9 UNIT/ML-% IJ SOLN
INTRAMUSCULAR | Status: DC | PRN
  Administered 2016-06-23: 1000 mL

## 2016-06-23 MED ORDER — BIVALIRUDIN BOLUS VIA INFUSION - CUPID
INTRAVENOUS | Status: DC | PRN
  Administered 2016-06-23: 69.75 mg via INTRAVENOUS

## 2016-06-23 MED ORDER — LABETALOL HCL 5 MG/ML IV SOLN
INTRAVENOUS | Status: DC | PRN
  Administered 2016-06-23: 10 mg via INTRAVENOUS

## 2016-06-23 MED ORDER — SODIUM CHLORIDE 0.9 % WEIGHT BASED INFUSION: 3.0000 mL/kg/h | INTRAVENOUS | Status: DC

## 2016-06-23 MED ORDER — SODIUM CHLORIDE 0.9% FLUSH
3.0000 mL | Freq: Two times a day (BID) | INTRAVENOUS | Status: DC
  Administered 2016-06-23: 3 mL via INTRAVENOUS

## 2016-06-23 MED ORDER — ATORVASTATIN CALCIUM 80 MG PO TABS
80.0000 mg | ORAL_TABLET | Freq: Every day | ORAL | Status: DC
Start: 2016-06-23 — End: 2016-06-24
  Administered 2016-06-23: 19:00:00 80 mg via ORAL
  Filled 2016-06-23: qty 1

## 2016-06-23 MED ORDER — SODIUM CHLORIDE 0.9 % WEIGHT BASED INFUSION
1.0000 mL/kg/h | INTRAVENOUS | Status: DC
  Administered 2016-06-23: 1 mL/kg/h via INTRAVENOUS

## 2016-06-23 MED ORDER — METOPROLOL TARTRATE 12.5 MG HALF TABLET
12.5000 mg | ORAL_TABLET | Freq: Two times a day (BID) | ORAL | Status: DC
  Administered 2016-06-23 – 2016-06-24 (×2): 12.5 mg via ORAL
  Filled 2016-06-23 (×2): qty 1

## 2016-06-23 MED ORDER — VERAPAMIL HCL 2.5 MG/ML IV SOLN: INTRAVENOUS | Status: DC | PRN

## 2016-06-23 MED ORDER — LABETALOL HCL 5 MG/ML IV SOLN
INTRAVENOUS | Status: AC
  Filled 2016-06-23: qty 4

## 2016-06-23 MED ORDER — VERAPAMIL HCL 2.5 MG/ML IV SOLN
INTRAVENOUS | Status: AC
  Filled 2016-06-23: qty 2

## 2016-06-23 MED ORDER — MIDAZOLAM HCL 2 MG/2ML IJ SOLN
INTRAMUSCULAR | Status: DC | PRN
  Administered 2016-06-23: 2 mg via INTRAVENOUS

## 2016-06-23 MED ORDER — LIDOCAINE HCL (PF) 1 % IJ SOLN
INTRAMUSCULAR | Status: AC
  Filled 2016-06-23: qty 30

## 2016-06-23 MED ORDER — FENTANYL CITRATE (PF) 100 MCG/2ML IJ SOLN
INTRAMUSCULAR | Status: AC
  Filled 2016-06-23: qty 2

## 2016-06-23 MED ORDER — TICAGRELOR 90 MG PO TABS
ORAL_TABLET | ORAL | Status: AC
  Filled 2016-06-23: qty 2

## 2016-06-23 MED ORDER — LIDOCAINE HCL (PF) 1 % IJ SOLN
INTRAMUSCULAR | Status: DC | PRN
  Administered 2016-06-23: 15 mL
  Administered 2016-06-23: 2 mL

## 2016-06-23 MED ORDER — SODIUM CHLORIDE 0.9 % WEIGHT BASED INFUSION: 1.0000 mL/kg/h | INTRAVENOUS | Status: AC

## 2016-06-23 MED ORDER — LABETALOL HCL 5 MG/ML IV SOLN: 10.0000 mg | INTRAVENOUS | Status: AC | PRN

## 2016-06-23 MED ORDER — HEPARIN (PORCINE) IN NACL 2-0.9 UNIT/ML-% IJ SOLN
INTRAMUSCULAR | Status: AC
  Filled 2016-06-23: qty 1000

## 2016-06-23 MED ORDER — ASPIRIN 81 MG PO CHEW: 81.0000 mg | CHEWABLE_TABLET | ORAL | Status: DC

## 2016-06-23 MED ORDER — FENTANYL CITRATE (PF) 100 MCG/2ML IJ SOLN
INTRAMUSCULAR | Status: DC | PRN
  Administered 2016-06-23: 25 ug via INTRAVENOUS
  Administered 2016-06-23: 50 ug via INTRAVENOUS

## 2016-06-23 MED ORDER — SODIUM CHLORIDE 0.9 % IV SOLN
INTRAVENOUS | Status: DC | PRN
  Administered 2016-06-23: 1.75 mg/kg/h via INTRAVENOUS

## 2016-06-23 MED ORDER — SODIUM CHLORIDE 0.9 % WEIGHT BASED INFUSION: 1.0000 mL/kg/h | INTRAVENOUS | Status: DC

## 2016-06-23 MED ORDER — HEPARIN SODIUM (PORCINE) 1000 UNIT/ML IJ SOLN
INTRAMUSCULAR | Status: AC
  Filled 2016-06-23: qty 1

## 2016-06-23 MED ORDER — TICAGRELOR 90 MG PO TABS
ORAL_TABLET | ORAL | Status: DC | PRN
  Administered 2016-06-23: 180 mg via ORAL

## 2016-06-23 SURGICAL SUPPLY — 25 items
BALLN EUPHORA RX 2.5X12 (BALLOONS) ×2
BALLN ~~LOC~~ EUPHORA RX 4.5X12 (BALLOONS) ×2
BALLOON EUPHORA RX 2.5X12 (BALLOONS) ×1 IMPLANT
BALLOON ~~LOC~~ EUPHORA RX 4.5X12 (BALLOONS) ×1 IMPLANT
CATH 5FR JL3.5 JR4 ANG PIG MP (CATHETERS) ×2 IMPLANT
CATH VISTA GUIDE 6FR XBLAD3.5 (CATHETERS) ×2 IMPLANT
COVER PRB 48X5XTLSCP FOLD TPE (BAG) ×1 IMPLANT
COVER PROBE 5X48 (BAG) ×2
DEVICE CLOSURE PERCLS PRGLD 6F (VASCULAR PRODUCTS) ×1 IMPLANT
GLIDESHEATH SLEND SS 6F .021 (SHEATH) ×2 IMPLANT
GUIDEWIRE INQWIRE 1.5J.035X260 (WIRE) ×1 IMPLANT
INQWIRE 1.5J .035X260CM (WIRE) ×2
KIT ENCORE 26 ADVANTAGE (KITS) ×2 IMPLANT
KIT HEART LEFT (KITS) ×2 IMPLANT
PACK CARDIAC CATHETERIZATION (CUSTOM PROCEDURE TRAY) ×2 IMPLANT
PERCLOSE PROGLIDE 6F (VASCULAR PRODUCTS) ×2
SHEATH PINNACLE 5F 10CM (SHEATH) ×2 IMPLANT
SHEATH PINNACLE 6F 10CM (SHEATH) ×2 IMPLANT
STENT SYNERGY DES 4X16 (Permanent Stent) ×2 IMPLANT
SYR MEDRAD MARK V 150ML (SYRINGE) ×2 IMPLANT
TRANSDUCER W/STOPCOCK (MISCELLANEOUS) ×2 IMPLANT
TUBING CIL FLEX 10 FLL-RA (TUBING) ×2 IMPLANT
WIRE COUGAR XT STRL 190CM (WIRE) ×2 IMPLANT
WIRE EMERALD 3MM-J .035X150CM (WIRE) ×2 IMPLANT
WIRE HI TORQ WHISPER MS 190CM (WIRE) ×2 IMPLANT

## 2016-06-23 NOTE — Interval H&P Note (Signed)
Cath Lab Visit (complete for each Cath Lab visit)  Clinical Evaluation Leading to the Procedure:   ACS: Yes.    Non-ACS:    Anginal Classification: CCS IV  Anti-ischemic medical therapy: Maximal Therapy (2 or more classes of medications)  Non-Invasive Test Results: No non-invasive testing performed  Prior CABG: No previous CABG      History and Physical Interval Note:  06/23/2016 1:49 PM  Kristina [name]  has presented today for surgery, with the diagnosis of cp  The various methods of treatment have been discussed with the patient and family. After consideration of risks, benefits and other options for treatment, the patient has consented to  Procedure(s): Left Heart Cath and Coronary Angiography (N/A) as a surgical intervention .  The patient's history has been reviewed, patient examined, no change in status, stable for surgery.  I have reviewed the patient's chart and labs.  Questions were answered to the patient's satisfaction.     Sherren Mocha

## 2016-06-23 NOTE — Progress Notes (Signed)
ANTICOAGULATION CONSULT NOTE - Follow Up Consult  Pharmacy Consult for Heparin Indication: chest pain/ACS  No Known Allergies  Patient Measurements: Height: 5\' 5"  (165.1 cm) Weight: 205 lb (93 kg) IBW/kg (Calculated) : 57 Heparin Dosing Weight: 78 kg  Vital Signs: Temp: 97.9 F (36.6 C) (03/15 0053) Temp Source: Oral (03/15 0053) BP: 130/87 (03/15 0053) Pulse Rate: 63 (03/15 0053)  Labs:  Recent Labs  06/22/16 1429 06/22/16 1814 06/22/16 2100 06/23/16 0034 06/23/16 0556 06/23/16 0926  HGB 13.1 12.4  --  11.7* 11.9*  --   HCT 40.8 38.3  --  36.7 37.3  --   PLT 278 276  --  237 240  --   LABPROT  --  13.4  --   --  14.5  --   INR  --  1.02  --   --  1.12  --   HEPARINUNFRC  --   --   --  0.48  --  0.40  CREATININE 1.03* 0.95  --   --  0.91  --   TROPONINI  --  0.04* 0.05* 0.05*  --   --     Estimated Creatinine Clearance: 66.7 mL/min (by C-G formula based on SCr of 0.91 mg/dL).   Assessment:   Heparin level remains therapeutic (0.40) on 900 units/hr.   For cardiac cath today.  Goal of Therapy:  Heparin level 0.3-0.7 units/ml Monitor platelets by anticoagulation protocol: Yes   Plan:   Continue heparin drip at 900 units/hr.  Daily heparin level and CBC while on heparin.  Follow up post-cath.  Arty Baumgartner, [place] Pager: [phone] 06/23/2016,11:01 AM

## 2016-06-23 NOTE — Progress Notes (Addendum)
PROGRESS NOTE    Kristina [name]  QPY:[id number] DOB: [birth year] DOA: 06/22/2016 PCP: [name], MD     Brief Narrative:  Kristina [name] is a 71 y.o. female with medical history significant of HTN who presents with exertional chest pressure. She states that she was in normal state of health about 3 weeks ago. She notes that since then, she has had generalized chest pressure along with shortness of breath with minimal exertion, even walking from bedroom to the kitchen. Normally, she had been able to walk 2-3 miles daily. Chest pressure and shortness of breath resolves with rest. She also admits to some lightheadedness associated with her symptoms. No nausea, vomiting, diarrhea, abdominal pain or other complaints. Currently, she is chest pain free. She also admits to some indigestion which relieves with Alka-seltzer, but this symptoms is unrelated to the chest pressure with exertion. She has never had any cardiac work up in the past other than an echocardiogram in remote past.   Assessment & Plan:   Principal Problem:   Chest pain Active Problems:   HTN (hypertension)  Unstable angina -Troponin flat 0.04, 0.05, 0.05 -Initial EKG with normal sinus rhythm with rate 101, no significant ST-T changes, repeat EKG this morning during chest pain episode with T wave inversion in lateral leads  -Echo in 01/18/2010 with EF 12-45%, grade 1 diastolic dysfunction  -Heart score 5 -Cardiology consulted, currently on heparin gtt, nitro gtt, planned for heart cath today  -Aspirin   Essential HTN -Continue norvasc, cozaar, lasix     DVT prophylaxis: heparin gtt  Code Status: full Family Communication: no family at bedside Disposition Plan: pending heart cath results   Consultants:   Cardiology   Procedures:   Heart cath 3/15   Antimicrobials:   None    Subjective: Patient with intermittent chest pain this morning. She is in bed, resting. She states that overnight, chest pressure  woke up from sleep. This improved with oxygen administration. After my examination in the morning, she had another episode of chest pressure which was relieved with IV morphine. Nitro drip was titrated.  Objective: Vitals:   06/23/16 0836 06/23/16 0843 06/23/16 0944 06/23/16 1045  BP: (!) 175/101 (!) 161/98 137/84 136/84  Pulse: 80 74 (!) 56   Resp:      Temp:      TempSrc:      SpO2: 100% 99% 94%   Weight:      Height:        Intake/Output Summary (Last 24 hours) at 06/23/16 1131 Last data filed at 06/23/16 0000  Gross per 24 hour  Intake            296.2 ml  Output                0 ml  Net            296.2 ml   Filed Weights   06/22/16 1423  Weight: 93 kg (205 lb)    Examination:  General exam: Appears calm and comfortable during initial examination when she was chest pain free, then anxious appearing during chest pain episode later in the morning  Respiratory system: Clear to auscultation. Respiratory effort normal. Cardiovascular system: S1 & S2 heard, RRR. No JVD, murmurs, rubs, gallops or clicks. No pedal edema. Gastrointestinal system: Abdomen is nondistended, soft and nontender. No organomegaly or masses felt. Normal bowel sounds heard. Central nervous system: Alert and oriented. No focal neurological deficits. Extremities: Symmetric 5 x 5 power.  Skin: No rashes, lesions or ulcers Psychiatry: Judgement and insight appear normal. Mood & affect appropriate.   Data Reviewed: I have personally reviewed following labs and imaging studies  CBC:  Recent Labs Lab 06/22/16 1429 06/22/16 1814 06/23/16 0034 06/23/16 0556  WBC 9.5 9.0 9.7 9.6  HGB 13.1 12.4 11.7* 11.9*  HCT 40.8 38.3 36.7 37.3  MCV 92.9 91.6 92.2 92.6  PLT 278 276 237 482   Basic Metabolic Panel:  Recent Labs Lab 06/22/16 1429 06/22/16 1814 06/23/16 0556  NA 138  --  142  K 4.0  --  3.8  CL 101  --  106  CO2 26  --  28  GLUCOSE 210*  --  87  BUN 11  --  12  CREATININE 1.03* 0.95 0.91    CALCIUM 9.1  --  8.7*   GFR: Estimated Creatinine Clearance: 66.7 mL/min (by C-G formula based on SCr of 0.91 mg/dL). Liver Function Tests: No results for input(s): AST, ALT, ALKPHOS, BILITOT, PROT, ALBUMIN in the last 168 hours. No results for input(s): LIPASE, AMYLASE in the last 168 hours. No results for input(s): AMMONIA in the last 168 hours. Coagulation Profile:  Recent Labs Lab 06/22/16 1814 06/23/16 0556  INR 1.02 1.12   Cardiac Enzymes:  Recent Labs Lab 06/22/16 1814 06/22/16 2100 06/23/16 0034  TROPONINI 0.04* 0.05* 0.05*   BNP (last 3 results) No results for input(s): PROBNP in the last 8760 hours. HbA1C: No results for input(s): HGBA1C in the last 72 hours. CBG: No results for input(s): GLUCAP in the last 168 hours. Lipid Profile: No results for input(s): CHOL, HDL, LDLCALC, TRIG, CHOLHDL, LDLDIRECT in the last 72 hours. Thyroid Function Tests: No results for input(s): TSH, T4TOTAL, FREET4, T3FREE, THYROIDAB in the last 72 hours. Anemia Panel: No results for input(s): VITAMINB12, FOLATE, FERRITIN, TIBC, IRON, RETICCTPCT in the last 72 hours. Sepsis Labs: No results for input(s): PROCALCITON, LATICACIDVEN in the last 168 hours.  No results found for this or any previous visit (from the past 240 hour(s)).     Radiology Studies: Dg Chest 2 View  Result Date: 06/22/2016 CLINICAL DATA:  Chest pain and pressure EXAM: CHEST  2 VIEW COMPARISON:  None. FINDINGS: Normal mediastinum and cardiac silhouette. Normal pulmonary vasculature. No evidence of effusion, infiltrate, or pneumothorax. No acute bony abnormality. IMPRESSION: No acute cardiopulmonary process. Electronically Signed   By: Suzy Bouchard M.D.   On: 06/22/2016 14:59      Scheduled Meds: . amLODipine  5 mg Oral Daily  . furosemide  20 mg Oral Daily  . losartan  100 mg Oral Daily  . sodium chloride flush  3 mL Intravenous Q12H   Continuous Infusions: . sodium chloride 1 mL/kg/hr (06/23/16  0730)  . heparin 900 Units/hr (06/22/16 1839)  . nitroGLYCERIN 10 mcg/min (06/23/16 1045)     LOS: 0 days    Time spent: 30 minutes   Dessa Phi, DO [facility] [email or web address] Password [facility] 06/23/2016, 11:31 AM

## 2016-06-23 NOTE — H&P (View-Only) (Signed)
Progress Note  Patient Name: Kristina [name] Date of Encounter: 06/23/2016  Primary Cardiologist: New to Dr.Eric Nees  Subjective   Had few episode of chest pain this morning. Resolved with IV morphine.   Inpatient Medications    Scheduled Meds: . amLODipine  5 mg Oral Daily  . furosemide  20 mg Oral Daily  . losartan  100 mg Oral Daily  . sodium chloride flush  3 mL Intravenous Q12H   Continuous Infusions: . sodium chloride 1 mL/kg/hr (06/23/16 0730)  . heparin 900 Units/hr (06/22/16 1839)  . nitroGLYCERIN 7 mcg/min (06/23/16 0845)   PRN Meds: sodium chloride, acetaminophen, ondansetron (ZOFRAN) IV, sodium chloride flush   Vital Signs    Vitals:   06/22/16 1645 06/22/16 1746 06/22/16 2205 06/23/16 0053  BP: 165/93 139/82 133/82 130/87  Pulse: 78 61 84 63  Resp: 20  18 18   Temp:   97.8 F (36.6 C) 97.9 F (36.6 C)  TempSrc:   Oral Oral  SpO2: 98% 96% 96% 98%  Weight:      Height:        Intake/Output Summary (Last 24 hours) at 06/23/16 0941 Last data filed at 06/23/16 0000  Gross per 24 hour  Intake            296.2 ml  Output                0 ml  Net            296.2 ml   Filed Weights   06/22/16 1423  Weight: 205 lb (93 kg)    Telemetry    NSR - Personally Reviewed  ECG    NSR with new TWI in lead I and aVL today - Personally Reviewed  Physical Exam   GEN: No acute distress.   Neck: No JVD Cardiac: RRR, no murmurs, rubs, or gallops.  Respiratory: Clear to auscultation bilaterally. GI: Soft, nontender, non-distended  Kristina: No edema; No deformity. Neuro:  Nonfocal  Psych: Normal affect   Labs    Chemistry Recent Labs Lab 06/22/16 1429 06/22/16 1814 06/23/16 0556  NA 138  --  142  K 4.0  --  3.8  CL 101  --  106  CO2 26  --  28  GLUCOSE 210*  --  87  BUN 11  --  12  CREATININE 1.03* 0.95 0.91  CALCIUM 9.1  --  8.7*  GFRNONAA 55* >60 >60  GFRAA >60 >60 >60  ANIONGAP 11  --  8     Hematology Recent Labs Lab 06/22/16 1814  06/23/16 0034 06/23/16 0556  WBC 9.0 9.7 9.6  RBC 4.18 3.98 4.03  HGB 12.4 11.7* 11.9*  HCT 38.3 36.7 37.3  MCV 91.6 92.2 92.6  MCH 29.7 29.4 29.5  MCHC 32.4 31.9 31.9  RDW 14.3 14.5 14.7  PLT 276 237 240    Cardiac Enzymes Recent Labs Lab 06/22/16 1814 06/22/16 2100 06/23/16 0034  TROPONINI 0.04* 0.05* 0.05*    Recent Labs Lab 06/22/16 1449  TROPIPOC 0.00     BNPNo results for input(s): BNP, PROBNP in the last 168 hours.   DDimer No results for input(s): DDIMER in the last 168 hours.   Radiology    Dg Chest 2 View  Result Date: 06/22/2016 CLINICAL DATA:  Chest pain and pressure EXAM: CHEST  2 VIEW COMPARISON:  None. FINDINGS: Normal mediastinum and cardiac silhouette. Normal pulmonary vasculature. No evidence of effusion, infiltrate, or pneumothorax. No acute bony abnormality. IMPRESSION:  No acute cardiopulmonary process. Electronically Signed   By: Suzy Bouchard M.D.   On: 06/22/2016 14:59    Cardiac Studies   Pending cath  Patient Profile     Kristina [name] is a 71 yo female with a PMH significant for HTN, asthma, and arthritis presents to the ED with 3 week history of chest pain. Cardiology was consulted for ACS evaluation.  Assessment & Plan    1. Unstable angina - She had recurrent chest pain this morning that resolved with IV morphine. Troponin 0.04-->0.05-->0.05. EKG with new TWI in lateral leads this morning. BP minimally elevated. Will up titrate IV nitro. For cath later today. - Continue ASA and IV heparin. Will check Lipid panel. Start statin tonight if CAD.   2.  HTN - Improving. AS above. Continue current medications.   Signed, Leanor Kail, PA  06/23/2016, 9:41 AM    Agree with note by Kristina Lis PA-C  Patient admitted with unstable angina. Her enzymes are low and flat. She has been on IV nitroglycerin and heparin overnight. She has had recurrent episodes of chest pain since admission. She scheduled for left heart cath later this  morning.  Kristina [name], M.D., [place], [facility], [place] [address] [place], [place]  [address]  [phone] 06/23/2016 10:35 AM

## 2016-06-23 NOTE — Care Management Note (Signed)
Case Management Note  Patient Details  Name: Kristina [name] MRN: [id number] Date of Birth: 1945-08-06  Subjective/Objective:  s/p coronary stent intervention, will be on brilinta and asa, NCM awaiting benefit check for brilinta.                  Action/Plan:   Expected Discharge Date:                  Expected Discharge Plan:     In-House Referral:     Discharge planning Services  CM Consult  Post Acute Care Choice:    Choice offered to:     DME Arranged:    DME Agency:     HH Arranged:    HH Agency:     Status of Service:  In process, will continue to follow  If discussed at Long Length of Stay Meetings, dates discussed:    Additional Comments:  Zenon Mayo, RN 06/23/2016, 9:00 PM

## 2016-06-23 NOTE — Progress Notes (Signed)
ANTICOAGULATION CONSULT NOTE - Follow Up Consult  Pharmacy Consult for heparin Indication: chest pain/ACS  Labs:  Recent Labs  06/22/16 1429 06/22/16 1814 06/22/16 2100 06/23/16 0034  HGB 13.1 12.4  --  11.7*  HCT 40.8 38.3  --  36.7  PLT 278 276  --  237  LABPROT  --  13.4  --   --   INR  --  1.02  --   --   HEPARINUNFRC  --   --   --  0.48  CREATININE 1.03* 0.95  --   --   TROPONINI  --  0.04* 0.05* 0.05*    Assessment/Plan:  71yo female therapeutic on heparin with initial dosing for CP. Will continue gtt at current rate and confirm stable with additional level.   Wynona Neat, PharmD, BCPS  06/23/2016,2:36 AM

## 2016-06-23 NOTE — Progress Notes (Signed)
Pt with complaint of chest pain rating 10/10. 4L of O2 applied. Pt on nitro gtt.  bp taken with a rate of 175/101. EKG taken running NSR.  Provider Choi to bedside to assess pt. Will continue to monitor pt. Pt given 2mg  of morphine iv. Will continue to monitor pt. Pt feeling better. Nitro drip changed to 7 mcg.

## 2016-06-23 NOTE — Progress Notes (Signed)
Progress Note  Patient Name: Kristina [name] Date of Encounter: 06/23/2016  Primary Cardiologist: New to Dr.Keslie Kristina [name]  Subjective   Had few episode of chest pain this morning. Resolved with IV morphine.   Inpatient Medications    Scheduled Meds: . amLODipine  5 mg Oral Daily  . furosemide  20 mg Oral Daily  . losartan  100 mg Oral Daily  . sodium chloride flush  3 mL Intravenous Q12H   Continuous Infusions: . sodium chloride 1 mL/kg/hr (06/23/16 0730)  . heparin 900 Units/hr (06/22/16 1839)  . nitroGLYCERIN 7 mcg/min (06/23/16 0845)   PRN Meds: sodium chloride, acetaminophen, ondansetron (ZOFRAN) IV, sodium chloride flush   Vital Signs    Vitals:   06/22/16 1645 06/22/16 1746 06/22/16 2205 06/23/16 0053  BP: 165/93 139/82 133/82 130/87  Pulse: 78 61 84 63  Resp: 20  18 18   Temp:   97.8 F (36.6 C) 97.9 F (36.6 C)  TempSrc:   Oral Oral  SpO2: 98% 96% 96% 98%  Weight:      Height:        Intake/Output Summary (Last 24 hours) at 06/23/16 0941 Last data filed at 06/23/16 0000  Gross per 24 hour  Intake            296.2 ml  Output                0 ml  Net            296.2 ml   Filed Weights   06/22/16 1423  Weight: 205 lb (93 kg)    Telemetry    NSR - Personally Reviewed  ECG    NSR with new TWI in lead I and aVL today - Personally Reviewed  Physical Exam   GEN: No acute distress.   Neck: No JVD Cardiac: RRR, no murmurs, rubs, or gallops.  Respiratory: Clear to auscultation bilaterally. GI: Soft, nontender, non-distended  Kristina: No edema; No deformity. Neuro:  Nonfocal  Psych: Normal affect   Labs    Chemistry Recent Labs Lab 06/22/16 1429 06/22/16 1814 06/23/16 0556  NA 138  --  142  K 4.0  --  3.8  CL 101  --  106  CO2 26  --  28  GLUCOSE 210*  --  87  BUN 11  --  12  CREATININE 1.03* 0.95 0.91  CALCIUM 9.1  --  8.7*  GFRNONAA 55* >60 >60  GFRAA >60 >60 >60  ANIONGAP 11  --  8     Hematology Recent Labs Lab 06/22/16 1814  06/23/16 0034 06/23/16 0556  WBC 9.0 9.7 9.6  RBC 4.18 3.98 4.03  HGB 12.4 11.7* 11.9*  HCT 38.3 36.7 37.3  MCV 91.6 92.2 92.6  MCH 29.7 29.4 29.5  MCHC 32.4 31.9 31.9  RDW 14.3 14.5 14.7  PLT 276 237 240    Cardiac Enzymes Recent Labs Lab 06/22/16 1814 06/22/16 2100 06/23/16 0034  TROPONINI 0.04* 0.05* 0.05*    Recent Labs Lab 06/22/16 1449  TROPIPOC 0.00     BNPNo results for input(s): BNP, PROBNP in the last 168 hours.   DDimer No results for input(s): DDIMER in the last 168 hours.   Radiology    Dg Chest 2 View  Result Date: 06/22/2016 CLINICAL DATA:  Chest pain and pressure EXAM: CHEST  2 VIEW COMPARISON:  None. FINDINGS: Normal mediastinum and cardiac silhouette. Normal pulmonary vasculature. No evidence of effusion, infiltrate, or pneumothorax. No acute bony abnormality. IMPRESSION:  No acute cardiopulmonary process. Electronically Signed   By: Kristina [name] M.D.   On: 06/22/2016 14:59    Cardiac Studies   Pending cath  Patient Profile     Kristina [name] is a 71 yo female with a PMH significant for HTN, asthma, and arthritis presents to the ED with 3 week history of chest pain. Cardiology was consulted for ACS evaluation.  Assessment & Plan    1. Unstable angina - She had recurrent chest pain this morning that resolved with IV morphine. Troponin 0.04-->0.05-->0.05. EKG with new TWI in lateral leads this morning. BP minimally elevated. Will up titrate IV nitro. For cath later today. - Continue ASA and IV heparin. Will check Lipid panel. Start statin tonight if CAD.   2.  HTN - Improving. AS above. Continue current medications.   Signed, Kristina Kail, PA  06/23/2016, 9:41 AM    Agree with note by Kristina Lis PA-C  Patient admitted with unstable angina. Her enzymes are low and flat. She has been on IV nitroglycerin and heparin overnight. She has had recurrent episodes of chest pain since admission. She scheduled for left heart cath later this  morning.  Kristina [name], M.D., [place], [facility], [place] [address] [place], [place]  [address]  [phone] 06/23/2016 10:35 AM

## 2016-06-23 NOTE — Progress Notes (Signed)
Blood was just drawn as this Probation officer entered room. Patient C/O chest discomfort. 4 L O2  applied.  Patient on Nitro gtt as well as Heparin gtt. While taking V/S, patient stated she was pain free. EKG was done earlier. Will continue to monitor patient for any further deviation. Cath is planned for 1200 today. Patient received her scheduled ASA 81 mg  For Cath today.

## 2016-06-24 ENCOUNTER — Encounter (HOSPITAL_COMMUNITY): Payer: Self-pay | Admitting: Cardiovascular Disease

## 2016-06-24 LAB — BASIC METABOLIC PANEL
ANION GAP: 9 (ref 5–15)
BUN: 7 mg/dL (ref 6–20)
CO2: 28 mmol/L (ref 22–32)
Calcium: 8.6 mg/dL — ABNORMAL LOW (ref 8.9–10.3)
Chloride: 103 mmol/L (ref 101–111)
Creatinine, Ser: 0.93 mg/dL (ref 0.44–1.00)
Glucose, Bld: 101 mg/dL — ABNORMAL HIGH (ref 65–99)
POTASSIUM: 3.9 mmol/L (ref 3.5–5.1)
SODIUM: 140 mmol/L (ref 135–145)

## 2016-06-24 LAB — LIPID PANEL
Cholesterol: 215 mg/dL — ABNORMAL HIGH (ref 0–200)
HDL: 58 mg/dL (ref >40)
LDL Cholesterol: 96 mg/dL (ref 0–99)
Total CHOL/HDL Ratio: 3.7 RATIO
Triglycerides: 306 mg/dL — ABNORMAL HIGH (ref <150)
VLDL: 61 mg/dL — ABNORMAL HIGH (ref 0–40)

## 2016-06-24 LAB — CBC
HEMATOCRIT: 38.4 % (ref 36.0–46.0)
HEMOGLOBIN: 12.3 g/dL (ref 12.0–15.0)
MCH: 29.9 pg (ref 26.0–34.0)
MCHC: 32 g/dL (ref 30.0–36.0)
MCV: 93.4 fL (ref 78.0–100.0)
Platelets: 215 10*3/uL (ref 150–400)
RBC: 4.11 MIL/uL (ref 3.87–5.11)
RDW: 14.8 % (ref 11.5–15.5)
WBC: 6.9 10*3/uL (ref 4.0–10.5)

## 2016-06-24 MED ORDER — TICAGRELOR 90 MG PO TABS: 90.0000 mg | ORAL_TABLET | Freq: Two times a day (BID) | ORAL | 10 refills | Status: DC

## 2016-06-24 MED ORDER — ASPIRIN 81 MG PO CHEW: 81.0000 mg | CHEWABLE_TABLET | Freq: Every day | ORAL | 0 refills | Status: AC

## 2016-06-24 MED ORDER — TICAGRELOR 90 MG PO TABS: 90.0000 mg | ORAL_TABLET | Freq: Two times a day (BID) | ORAL | 0 refills | Status: DC

## 2016-06-24 MED ORDER — METOPROLOL TARTRATE 25 MG PO TABS: 12.5000 mg | ORAL_TABLET | Freq: Two times a day (BID) | ORAL | 0 refills | Status: DC

## 2016-06-24 MED ORDER — ATORVASTATIN CALCIUM 80 MG PO TABS: 80.0000 mg | ORAL_TABLET | Freq: Every day | ORAL | 0 refills | Status: AC

## 2016-06-24 MED ORDER — ANGIOPLASTY BOOK
Freq: Once | Status: AC
  Administered 2016-06-24: 1
  Filled 2016-06-24: qty 1

## 2016-06-24 MED FILL — Verapamil HCl IV Soln 2.5 MG/ML: INTRAVENOUS | Qty: 2 | Status: AC

## 2016-06-24 NOTE — Care Management Note (Signed)
Case Management Note  Patient Details  Name: RONETTA MOLLA MRN: [id number] Date of Birth: 23-Oct-1945  Subjective/Objective:    s/p coronary stent intervention, will be on brilinta and asa, NCM awaiting benefit check for brilinta.  She is from home with granddaughter and son.  PTA indep, she will go to [place] at [facility] to get her 30 day free brilinta and they have in stock.  She is for dc today.  Still awaiting benefit check.                              Action/Plan:   Expected Discharge Date:                  Expected Discharge Plan:  Home/Self Care  In-House Referral:     Discharge planning Services  CM Consult  Post Acute Care Choice:    Choice offered to:     DME Arranged:    DME Agency:     HH Arranged:    HH Agency:     Status of Service:  Completed, signed off  If discussed at [employer] of Stay Meetings, dates discussed:    Additional Comments:  Zenon Mayo, RN 06/24/2016, 10:09 AM

## 2016-06-24 NOTE — Progress Notes (Signed)
Progress Note  Patient Name: Kristina [name] Date of Encounter: 06/24/2016  Primary Cardiologist: New to Dr.Lalana Wachter  Subjective   Postop day 1 ostial circumflex PCI and drug-eluting stenting. The patient denies chest pain or shortness of breath.  Inpatient Medications    Scheduled Meds: . amLODipine  5 mg Oral Daily  . aspirin  81 mg Oral Daily  . atorvastatin  80 mg Oral q1800  . furosemide  20 mg Oral Daily  . losartan  100 mg Oral Daily  . metoprolol tartrate  12.5 mg Oral BID  . sodium chloride flush  3 mL Intravenous Q12H  . ticagrelor  90 mg Oral BID   Continuous Infusions:  PRN Meds: sodium chloride, acetaminophen, ondansetron (ZOFRAN) IV, sodium chloride flush   Vital Signs    Vitals:   06/24/16 0030 06/24/16 0230 06/24/16 0643 06/24/16 0740  BP: (!) 152/68 (!) 149/67  (!) 147/72  Pulse: (!) 59 64  79  Resp: 14 14  20   Temp: 98.1 F (36.7 C)   98.2 F (36.8 C)  TempSrc: Oral   Oral  SpO2: 94% 94%  98%  Weight:   209 lb 7 oz (95 kg)   Height:        Intake/Output Summary (Last 24 hours) at 06/24/16 1022 Last data filed at 06/24/16 0741  Gross per 24 hour  Intake           1273.4 ml  Output             1100 ml  Net            173.4 ml   Filed Weights   06/22/16 1423 06/24/16 0643  Weight: 205 lb (93 kg) 209 lb 7 oz (95 kg)    Telemetry    NSR  With occasional PVCs. - Personally Reviewed  ECG    NSR with new TWI in lead I and aVL today - Personally Reviewed  Physical Exam   GEN: No acute distress.   Neck: No JVD Cardiac: RRR, no murmurs, rubs, or gallops.  Respiratory: Clear to auscultation bilaterally. GI: Soft, nontender, non-distended  Kristina: No edema; No deformity. Neuro:  Nonfocal  Psych: Normal affect  Right radial puncture site looks okay, otherwise unchanged exam Labs    Chemistry  Recent Labs Lab 06/22/16 1429 06/22/16 1814 06/23/16 0556 06/24/16 0219  NA 138  --  142 140  K 4.0  --  3.8 3.9  CL 101  --  106 103  CO2  26  --  28 28  GLUCOSE 210*  --  87 101*  BUN 11  --  12 7  CREATININE 1.03* 0.95 0.91 0.93  CALCIUM 9.1  --  8.7* 8.6*  GFRNONAA 55* >60 >60 >60  GFRAA >60 >60 >60 >60  ANIONGAP 11  --  8 9     Hematology  Recent Labs Lab 06/23/16 0034 06/23/16 0556 06/24/16 0219  WBC 9.7 9.6 6.9  RBC 3.98 4.03 4.11  HGB 11.7* 11.9* 12.3  HCT 36.7 37.3 38.4  MCV 92.2 92.6 93.4  MCH 29.4 29.5 29.9  MCHC 31.9 31.9 32.0  RDW 14.5 14.7 14.8  PLT 237 240 215    Cardiac Enzymes  Recent Labs Lab 06/22/16 1814 06/22/16 2100 06/23/16 0034  TROPONINI 0.04* 0.05* 0.05*     Recent Labs Lab 06/22/16 1449  TROPIPOC 0.00     BNPNo results for input(s): BNP, PROBNP in the last 168 hours.   DDimer No results  for input(s): DDIMER in the last 168 hours.   Radiology    Dg Chest 2 View  Result Date: 06/22/2016 CLINICAL DATA:  Chest pain and pressure EXAM: CHEST  2 VIEW COMPARISON:  None. FINDINGS: Normal mediastinum and cardiac silhouette. Normal pulmonary vasculature. No evidence of effusion, infiltrate, or pneumothorax. No acute bony abnormality. IMPRESSION: No acute cardiopulmonary process. Electronically Signed   By: Suzy Bouchard M.D.   On: 06/22/2016 14:59    Cardiac Studies   Cardiac catheterization 06/23/16  Conclusion   1. Severe 2 vessel coronary artery disease with critical stenosis of the proximal LAD and severe stenosis of the mid circumflex 2. Successful PCI to proximal LAD using a 4.0 mm Synergy DES postdilated with a 4.5 mm noncompliant balloon 3. Patent RCA with nonobstructive disease of line 4. Normal LV systolic function  Recommendations:  Dual antiplatelet therapy with aspirin and brilinta for a minimum of 1 year  Medical therapy for her residual CAD  The left circumflex is severely diseased, but there is severe angulation proximal to the lesion. It might be best to treat her medically and if refractory angina occurs she could be considered for PCI of the  circumflex.     Patient Profile     Kristina [name] is a 71 yo female with a PMH significant for HTN, asthma, and arthritis presents to the ED with 3 week history of chest pain. Cardiology was consulted for ACS evaluation. She underwent cardiac catheterization by Dr. Burt Knack yesterday revealing high-grade ostial LAD stenosis which was stented with a drug-eluting stent. She had residual mid AV groove circumflex disease this vessel was significantly tortuous making intervention tentatively challenging. She had normal LV function. The plan was to treat her residual CAD medically and reserve circumflex intervention for refractory angina.  Assessment & Plan    1. Unstable angina - Postop day one coronary angiography, ostial LAD PCI and drug-eluting stenting by Dr. Burt Knack with rigid residual circumflex disease treated medically because of tortuosity. She has normal LV function. She is on triple antibiotic therapy. She is asymptomatic.  2.  HTN - Improving. AS above. Continue current medications.   Postop day #1 ostial LAD PCI and drug-eluting stenting with residual circumflex disease that will be treated medically. She has normal LV function. Okay for discharge charts today from our point of view on dual antiplatelet therapy. Follow-up with me in 2-3 weeks.  [name], M.D., [place], [facility], [place] [address] [place], [place]  [address]  [phone] 06/24/2016 10:25 AM

## 2016-06-24 NOTE — Progress Notes (Signed)
Progress Note  Patient Name: Kristina [name] Date of Encounter: 06/24/2016  Primary Cardiologist: New to Dr.Orvin Netter  Subjective   Postop day one ostial LAD PCI and drug-eluting stenting with residual disease in a nondominant circumflex with significant tortuosity. She has normal LV function. She denies chest pain or shortness of breath.   Inpatient Medications    Scheduled Meds: . amLODipine  5 mg Oral Daily  . aspirin  81 mg Oral Daily  . atorvastatin  80 mg Oral q1800  . furosemide  20 mg Oral Daily  . losartan  100 mg Oral Daily  . metoprolol tartrate  12.5 mg Oral BID  . sodium chloride flush  3 mL Intravenous Q12H  . ticagrelor  90 mg Oral BID   Continuous Infusions:  PRN Meds: sodium chloride, acetaminophen, ondansetron (ZOFRAN) IV, sodium chloride flush   Vital Signs    Vitals:   06/24/16 0030 06/24/16 0230 06/24/16 0643 06/24/16 0740  BP: (!) 152/68 (!) 149/67  (!) 147/72  Pulse: (!) 59 64  79  Resp: 14 14  20   Temp: 98.1 F (36.7 C)   98.2 F (36.8 C)  TempSrc: Oral   Oral  SpO2: 94% 94%  98%  Weight:   209 lb 7 oz (95 kg)   Height:        Intake/Output Summary (Last 24 hours) at 06/24/16 1009 Last data filed at 06/24/16 0741  Gross per 24 hour  Intake           1273.4 ml  Output             1100 ml  Net            173.4 ml   Filed Weights   06/22/16 1423 06/24/16 0643  Weight: 205 lb (93 kg) 209 lb 7 oz (95 kg)    Telemetry    NSR With occasional PVCs - Personally Reviewed  ECG    NSR with new TWI in lead I and aVL today - Personally Reviewed  Physical Exam   GEN: No acute distress.   Neck: No JVD Cardiac: RRR, no murmurs, rubs, or gallops.  Respiratory: Clear to auscultation bilaterally. GI: Soft, nontender, non-distended  Kristina: No edema; No deformity. Neuro:  Nonfocal  Psych: Normal affect   Labs    Chemistry  Recent Labs Lab 06/22/16 1429 06/22/16 1814 06/23/16 0556 06/24/16 0219  NA 138  --  142 140  K 4.0  --  3.8  3.9  CL 101  --  106 103  CO2 26  --  28 28  GLUCOSE 210*  --  87 101*  BUN 11  --  12 7  CREATININE 1.03* 0.95 0.91 0.93  CALCIUM 9.1  --  8.7* 8.6*  GFRNONAA 55* >60 >60 >60  GFRAA >60 >60 >60 >60  ANIONGAP 11  --  8 9     Hematology  Recent Labs Lab 06/23/16 0034 06/23/16 0556 06/24/16 0219  WBC 9.7 9.6 6.9  RBC 3.98 4.03 4.11  HGB 11.7* 11.9* 12.3  HCT 36.7 37.3 38.4  MCV 92.2 92.6 93.4  MCH 29.4 29.5 29.9  MCHC 31.9 31.9 32.0  RDW 14.5 14.7 14.8  PLT 237 240 215    Cardiac Enzymes  Recent Labs Lab 06/22/16 1814 06/22/16 2100 06/23/16 0034  TROPONINI 0.04* 0.05* 0.05*     Recent Labs Lab 06/22/16 1449  TROPIPOC 0.00     BNPNo results for input(s): BNP, PROBNP in the last 168  hours.   DDimer No results for input(s): DDIMER in the last 168 hours.   Radiology    Dg Chest 2 View  Result Date: 06/22/2016 CLINICAL DATA:  Chest pain and pressure EXAM: CHEST  2 VIEW COMPARISON:  None. FINDINGS: Normal mediastinum and cardiac silhouette. Normal pulmonary vasculature. No evidence of effusion, infiltrate, or pneumothorax. No acute bony abnormality. IMPRESSION: No acute cardiopulmonary process. Electronically Signed   By: Kristina [name] M.D.   On: 06/22/2016 14:59    Cardiac Studies   Pending cath  Patient Profile     Kristina [name] is a 71 yo female with a PMH significant for HTN, asthma, and arthritis presents to the ED with 3 week history of chest pain. Cardiology was consulted for ACS evaluation.  Assessment & Plan    1. Unstable angina - She had recurrent chest pain this morning that resolved with IV morphine. Troponin 0.04-->0.05-->0.05. EKG with new TWI in lateral leads this morning. BP minimally elevated. Will up titrate IV nitro. For cath later today. - Continue ASA and IV heparin. Will check Lipid panel. Start statin tonight if CAD.   2.  HTN - Improving. AS above. Continue current medications.   Kristina Sheriff, MD  06/24/2016, 10:09  AM    Agree with note by Kristina Lis PA-C  Patient admitted with unstable angina. Her enzymes are low and flat. She has been on IV nitroglycerin and heparin overnight. She has had recurrent episodes of chest pain since admission. She scheduled for left heart cath later this morning.  Kristina [name], M.D., [place], [facility], [name], [name]  [phone] 06/24/2016 10:09 AM

## 2016-06-24 NOTE — Discharge Summary (Signed)
Physician Discharge Summary  Kristina [name]:[id number] DOB: 24-May-1945 DOA: 06/22/2016  PCP: [name], MD  Admit date: 06/22/2016 Discharge date: 06/24/2016  Admitted From: Home Disposition:  Home  Recommendations for Outpatient Follow-up:  1. Follow up with PCP in 1 week 2. Follow up with Cardiology Dr. Gwenlyn Found in 2-3 weeks 3. Patient referred to cardiac rehab  Home Health: No  Equipment/Devices: None   Discharge Condition: Stable CODE STATUS: Full  Diet recommendation: Heart healthy   Brief/Interim Summary: Kristina [name] a 71 y.o.femalewith medical history significant of HTN who presents with exertional chest pressure. She states that she was in normal state of health about 3 weeks ago. She notes that since then, she has had generalized chest pressure along with shortness of breath with minimal exertion, even walking from bedroom to the kitchen. Normally, she had been able to walk 2-3 miles daily. Chest pressure and shortness of breath resolves with rest. She also admits to some lightheadedness associated with her symptoms. No nausea, vomiting, diarrhea, abdominal pain or other complaints. Currently, she is chest pain free. She also admits to some indigestion which relieves with Alka-seltzer, but this symptoms is unrelated to the chest pressure with exertion. She has never had any cardiac work up in the past other than an echocardiogram in remote past. Troponin remained flat, but patient continued to experience intermittent chest pressure. She was evaluated by cardiology and underwent heart cath on 3/15 with ostial LAD PCI and drug-eluting stent with residual circumflex disease that will be treated medically.   Subjective on day of discharge: Doing well and ambulating. She denies any further chest pain, shortness of breath.   Discharge Diagnoses:  Principal Problem:   Chest pain Active Problems:   HTN (hypertension)   Coronary artery disease involving native coronary  artery of native heart with unstable angina pectoris (HCC)  Unstable angina -Troponin flat 0.04, 0.05, 0.05 -Initial EKG with normal sinus rhythm with rate 101, no significant ST-T changes, repeat EKG during chest pain episode with T wave inversion in lateral leads  -Echo in 01/18/2010 with EF 32-95%, grade 1 diastolic dysfunction  -S/p heart cath and DES 3/15. Referred to cardiac rehab.  -Dual antiplatelet tx, lipitor, betablocker   Essential HTN -Continue norvasc, cozaar, lasix   Discharge Instructions  Discharge Instructions    Amb Referral to Cardiac Rehabilitation    Complete by:  As directed    Diagnosis:  Coronary Stents   Call MD for:  difficulty breathing, headache or visual disturbances    Complete by:  As directed    Call MD for:  extreme fatigue    Complete by:  As directed    Call MD for:  persistant dizziness or light-headedness    Complete by:  As directed    Call MD for:  persistant nausea and vomiting    Complete by:  As directed    Call MD for:  severe uncontrolled pain    Complete by:  As directed    Diet - low sodium heart healthy    Complete by:  As directed    Increase activity slowly    Complete by:  As directed      Allergies as of 06/24/2016   No Known Allergies     Medication List    STOP taking these medications   predniSONE 20 MG tablet Commonly known as:  DELTASONE     TAKE these medications   amLODipine 5 MG tablet Commonly known as:  NORVASC Take  5 mg by mouth daily.   aspirin 81 MG chewable tablet Chew 1 tablet (81 mg total) by mouth daily. Start taking on:  06/25/2016   atorvastatin 80 MG tablet Commonly known as:  LIPITOR Take 1 tablet (80 mg total) by mouth daily at 6 PM.   furosemide 20 MG tablet Commonly known as:  LASIX Take 20 mg by mouth daily.   losartan 100 MG tablet Commonly known as:  COZAAR Take 100 mg by mouth daily.   metoprolol tartrate 25 MG tablet Commonly known as:  LOPRESSOR Take 0.5 tablets (12.5  mg total) by mouth 2 (two) times daily.   ticagrelor 90 MG Tabs tablet Commonly known as:  BRILINTA Take 1 tablet (90 mg total) by mouth 2 (two) times daily.   VITAMIN D PO Take 1 capsule by mouth daily.      Follow-up Information    [name], MD. Schedule an appointment as soon as possible for a visit in 1 week(s).   Specialty:  Internal Medicine Contact information: [address] [place] [address] [phone]        Quay Burow, MD. Schedule an appointment as soon as possible for a visit in 2 week(s).   Specialties:  Cardiology, Radiology Contact information: [address] [place] [address] [phone]          No Known Allergies  Consultations:  Cardiology   Procedures/Studies: Dg Chest 2 View  Result Date: 06/22/2016 CLINICAL DATA:  Chest pain and pressure EXAM: CHEST  2 VIEW COMPARISON:  None. FINDINGS: Normal mediastinum and cardiac silhouette. Normal pulmonary vasculature. No evidence of effusion, infiltrate, or pneumothorax. No acute bony abnormality. IMPRESSION: No acute cardiopulmonary process. Electronically Signed   By: Suzy Bouchard M.D.   On: 06/22/2016 14:59    Heart cath    Conclusion   1. Severe 2 vessel coronary artery disease with critical stenosis of the proximal LAD and severe stenosis of the mid circumflex 2. Successful PCI to proximal LAD using a 4.0 mm Synergy DES postdilated with a 4.5 mm noncompliant balloon 3. Patent RCA with nonobstructive disease of line 4. Normal LV systolic function  Recommendations:  Dual antiplatelet therapy with aspirin and brilinta for a minimum of 1 year  Medical therapy for her residual CAD  The left circumflex is severely diseased, but there is severe angulation proximal to the lesion. It might be best to treat her medically and if refractory angina occurs she could be considered for PCI of the circumflex.       Discharge Exam: Vitals:   06/24/16 0230  06/24/16 0740  BP: (!) 149/67 (!) 147/72  Pulse: 64 79  Resp: 14 20  Temp:  98.2 F (36.8 C)   Vitals:   06/24/16 0030 06/24/16 0230 06/24/16 0643 06/24/16 0740  BP: (!) 152/68 (!) 149/67  (!) 147/72  Pulse: (!) 59 64  79  Resp: 14 14  20   Temp: 98.1 F (36.7 C)   98.2 F (36.8 C)  TempSrc: Oral   Oral  SpO2: 94% 94%  98%  Weight:   95 kg (209 lb 7 oz)   Height:        General: Pt is alert, awake, not in acute distress Cardiovascular: RRR, S1/S2 +, no rubs, no gallops Respiratory: CTA bilaterally, no wheezing, no rhonchi Abdominal: Soft, NT, ND, bowel sounds + Extremities: no edema, no cyanosis    The results of significant diagnostics from this hospitalization (including imaging, microbiology, ancillary and laboratory)  are listed below for reference.     Microbiology: No results found for this or any previous visit (from the past 240 hour(s)).   Labs: BNP (last 3 results) No results for input(s): BNP in the last 8760 hours. Basic Metabolic Panel:  Recent Labs Lab 06/22/16 1429 06/22/16 1814 06/23/16 0556 06/24/16 0219  NA 138  --  142 140  K 4.0  --  3.8 3.9  CL 101  --  106 103  CO2 26  --  28 28  GLUCOSE 210*  --  87 101*  BUN 11  --  12 7  CREATININE 1.03* 0.95 0.91 0.93  CALCIUM 9.1  --  8.7* 8.6*   Liver Function Tests: No results for input(s): AST, ALT, ALKPHOS, BILITOT, PROT, ALBUMIN in the last 168 hours. No results for input(s): LIPASE, AMYLASE in the last 168 hours. No results for input(s): AMMONIA in the last 168 hours. CBC:  Recent Labs Lab 06/22/16 1429 06/22/16 1814 06/23/16 0034 06/23/16 0556 06/24/16 0219  WBC 9.5 9.0 9.7 9.6 6.9  HGB 13.1 12.4 11.7* 11.9* 12.3  HCT 40.8 38.3 36.7 37.3 38.4  MCV 92.9 91.6 92.2 92.6 93.4  PLT 278 276 237 240 215   Cardiac Enzymes:  Recent Labs Lab 06/22/16 1814 06/22/16 2100 06/23/16 0034  TROPONINI 0.04* 0.05* 0.05*   BNP: Invalid input(s): POCBNP CBG: No results for input(s):  GLUCAP in the last 168 hours. D-Dimer No results for input(s): DDIMER in the last 72 hours. Hgb A1c No results for input(s): HGBA1C in the last 72 hours. Lipid Profile  Recent Labs  06/24/16 0219  CHOL 215*  HDL 58  LDLCALC 96  TRIG 306*  CHOLHDL 3.7   Thyroid function studies No results for input(s): TSH, T4TOTAL, T3FREE, THYROIDAB in the last 72 hours.  Invalid input(s): FREET3 Anemia work up No results for input(s): VITAMINB12, FOLATE, FERRITIN, TIBC, IRON, RETICCTPCT in the last 72 hours. Urinalysis    Component Value Date/Time   COLORURINE YELLOW 03/26/2011 0922   APPEARANCEUR CLEAR 03/26/2011 0922   LABSPEC 1.006 03/26/2011 0922   PHURINE 7.5 03/26/2011 0922   GLUCOSEU NEGATIVE 03/26/2011 0922   HGBUR NEGATIVE 03/26/2011 0922   BILIRUBINUR NEGATIVE 03/26/2011 0922   KETONESUR NEGATIVE 03/26/2011 0922   PROTEINUR 30 (A) 03/26/2011 0922   UROBILINOGEN 0.2 03/26/2011 0922   NITRITE NEGATIVE 03/26/2011 0922   LEUKOCYTESUR MODERATE (A) 03/26/2011 0922   Sepsis Labs Invalid input(s): PROCALCITONIN,  WBC,  LACTICIDVEN Microbiology No results found for this or any previous visit (from the past 240 hour(s)).   Time coordinating discharge: 40 minutes  SIGNED:  Dessa Phi, DO [facility] Pager [phone]  If 7PM-7AM, please contact night-coverage [email or web address] Password [facility] 06/24/2016, 10:33 AM

## 2016-06-24 NOTE — Progress Notes (Signed)
Brilinta 90mg  BID is covered. Copay is $50. Prior Josem Kaufmann is not needed. Drug is under Tier 3.

## 2016-06-24 NOTE — Progress Notes (Signed)
CARDIAC REHAB PHASE I   PRE:  Rate/Rhythm: 99 SR  BP:  Sitting: 152/81        SaO2: 98 RA  MODE:  Ambulation: 800 ft   POST:  Rate/Rhythm: 98 SR  BP:  Sitting: 142/78         SaO2: 98 RA  Pt ambulated 800 ft on RA, independent, steady gait, tolerated well with no complaints.  Completed PCI/stent education.  Reviewed risk factors, PCI book, anti-platelet therapy, stent card, activity restrictions, ntg, exercise, heart healthy diet, and phase 2 cardiac rehab. Pt verbalized understanding, very receptive. Pt agrees to phase 2 cardiac rehab referral, will send to [facility] per pt request. Pt to recliner after walk, call bell within reach.    [phone] Lenna Sciara, RN, BSN 06/24/2016 9:16 AM

## 2016-06-27 ENCOUNTER — Telehealth (HOSPITAL_COMMUNITY): Payer: Self-pay | Admitting: Internal Medicine

## 2016-06-27 NOTE — Telephone Encounter (Signed)
Patient has [employer], insurance benefits verified. UHC - $25.00 co-payment, deductible $500/$0.05 has been met, out of pocket $2500/$114.66 has been met, 0% co-insurance, no pre-authorization and no limit on visits. Passport/reference # [id number].

## 2016-06-28 ENCOUNTER — Telehealth: Payer: Self-pay

## 2016-06-28 ENCOUNTER — Telehealth: Payer: Self-pay | Admitting: Cardiology

## 2016-06-28 NOTE — Telephone Encounter (Signed)
Lm2cb pt needs to keep appt with Lurena Joiner 07-12-16 @ 11am to get back to work note.

## 2016-06-28 NOTE — Telephone Encounter (Signed)
°   New message    pt verbalized that she is calling to speak to rn    Dr.Berry took pt out of work a letter needs to be sent to her employer [phone]  Attention : Aaron Edelman

## 2016-06-28 NOTE — Telephone Encounter (Signed)
Received a call from patient she stated Dr.Berry released her to go back to work this week.Stated her work needs a note today so she can return to work 06/29/16.Note to return to work faxed to [employer] at fax # [phone].

## 2016-07-12 ENCOUNTER — Ambulatory Visit (INDEPENDENT_AMBULATORY_CARE_PROVIDER_SITE_OTHER): Payer: 59 | Admitting: Cardiology

## 2016-07-12 ENCOUNTER — Encounter: Payer: Self-pay | Admitting: Cardiology

## 2016-07-12 VITALS — BP 149/89 | HR 75 | Ht 66.0 in | Wt 216.8 lb

## 2016-07-12 DIAGNOSIS — I200 Unstable angina: Secondary | ICD-10-CM

## 2016-07-12 DIAGNOSIS — Z9861 Coronary angioplasty status: Secondary | ICD-10-CM

## 2016-07-12 DIAGNOSIS — I10 Essential (primary) hypertension: Secondary | ICD-10-CM

## 2016-07-12 DIAGNOSIS — I2510 Atherosclerotic heart disease of native coronary artery without angina pectoris: Secondary | ICD-10-CM

## 2016-07-12 DIAGNOSIS — E785 Hyperlipidemia, unspecified: Secondary | ICD-10-CM | POA: Diagnosis not present

## 2016-07-12 MED ORDER — NITROGLYCERIN 0.4 MG SL SUBL: 0.4000 mg | SUBLINGUAL_TABLET | SUBLINGUAL | 2 refills | Status: AC | PRN

## 2016-07-12 NOTE — Progress Notes (Signed)
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 She has a history of HTN but no prior history of CAD or MI. She was admitted 06/22/16 with chest pain worrisome for [place]. Her Troponin was 0.05 and her EKG had NSST changes. She was admitted  on Heparin and had a cath 06/23/16 that revealed 2V CAD with a 99% pLAD and 80% mCFX (tortuous vessel). Her LVF was normal. She underwent PCI with DES placement to her LAD. The plan is medical Rc for her CFX disease. She is in the office today for follow up. She has had no chest pain similar to her pre PCI symptoms.    Current Outpatient Prescriptions  Medication Sig Dispense Refill  . amLODipine (NORVASC) 5 MG tablet Take 5 mg by mouth daily.    [name] aspirin 81 MG chewable tablet Chew 1 tablet (81 mg total) by mouth daily. 30 tablet 0  . atorvastatin (LIPITOR) 80 MG tablet Take 1 tablet (80 mg total) by mouth daily at 6 PM. 30 tablet 0  . Cholecalciferol (VITAMIN D PO) Take 1 capsule by mouth daily.    . furosemide (LASIX) 20 MG tablet Take 20 mg by mouth daily.     [name] losartan (COZAAR) 100 MG tablet Take 100 mg by mouth daily.     . metoprolol tartrate (LOPRESSOR) 25 MG tablet Take 0.5 tablets (12.5 mg total) by mouth 2 (two) times daily. 60 tablet 0  . ticagrelor (BRILINTA) 90 MG TABS tablet Take 1 tablet (90 mg total) by mouth 2 (two) times daily. 60 tablet 10   No current facility-administered medications for this visit.     No Known Allergies  Past Medical History:  Diagnosis Date  . Arthritis    "right arm" (06/22/2016)  . Carpal tunnel syndrome   . Childhood asthma   . Hypertension   . Plantar fasciitis, bilateral    "had shots in them" (06/22/2016)  . Seizures ([place])    "when I was young" (06/22/2016)  . Wears glasses     Social History   Social History  .  Marital status: Divorced    Spouse name: N/A  . Number of children: N/A  . Years of education: N/A   Occupational History  . Not on file.   Social History Main Topics  . Smoking status: Never Smoker  . Smokeless tobacco: Never Used  . Alcohol use 3.6 oz/week    6 Glasses of wine per week  . Drug use: No  . Sexual activity: Not Currently   Other Topics Concern  . Not on file   Social History Narrative  . No narrative on file     Family History  Problem Relation Age of Onset  . Stroke Mother 26  . Heart attack Mother 59  . Lung cancer Father   . Lung cancer Brother      Review of Systems: General: negative for chills, fever, night sweats or weight changes.  Cardiovascular: negative for chest pain, dyspnea on exertion, edema, orthopnea, palpitations, paroxysmal nocturnal dyspnea or shortness of breath Dermatological: negative for rash Respiratory: negative for cough or wheezing Urologic: negative for hematuria Abdominal: negative for nausea, vomiting, diarrhea, bright red blood per rectum, melena, or hematemesis Neurologic: negative for visual changes, syncope, or dizziness All other systems reviewed and are otherwise negative except  as noted above.    Blood pressure (!) 149/89, pulse 75, height 5\' 6"  (1.676 m), weight 216 lb 12.8 oz (98.3 kg).  General appearance: alert, cooperative, no distress and mildly obese Neck: no carotid bruit and no JVD Lungs: clear to auscultation bilaterally Heart: regular rate and rhythm Neurologic: Grossly normal   ASSESSMENT AND PLAN:   Unstable angina ([place]) Admitted 06/22/16- with chest pain-TWI anterior leads  Benign essential HTN Normal LVF at cath march 2018  Dyslipidemia LDL 96 when admitted in March 2018- placed on high dose statin Rx   PLAN  I suggested she continue with Lopressor 25 mg BID (there was some confusion about this dose- she went home on 12.5 mg BID). We discussed symptoms of exertional angina and she'll  contact us if these occur. F/U with Dr Gwenlyn Found 3 months- fasting Lipids and CMET prior to that visit.   Kerin Ransom PA-C 07/12/2016 11:45 AM

## 2016-07-12 NOTE — Assessment & Plan Note (Signed)
Normal LVF at cath march 2018

## 2016-07-12 NOTE — Assessment & Plan Note (Signed)
Admitted 06/22/16- with chest pain-TWI anterior leads

## 2016-07-12 NOTE — Assessment & Plan Note (Signed)
LDL 96 when admitted in March 2018- placed on high dose statin Rx

## 2016-07-12 NOTE — Patient Instructions (Signed)
Medication Instructions:  Continue Metoprolol 25 mg twice a day  Labwork: Fasting Lipids and CMP  Testing/Procedures: None Ordered  Follow-Up: Your physician recommends that you schedule a follow-up appointment in: 3 Months with Dr Gwenlyn Found.  Any Other Special Instructions Will Be Listed Below (If Applicable).   If you need a refill on your cardiac medications before your next appointment, please call your pharmacy.

## 2016-07-15 ENCOUNTER — Telehealth (HOSPITAL_COMMUNITY): Payer: Self-pay | Admitting: Internal Medicine

## 2016-07-15 NOTE — Telephone Encounter (Signed)
I called and left message on voicemail to call office about scheduling for Cardiac Rehab program. I left my contact information on patient voicemail.

## 2016-08-01 ENCOUNTER — Telehealth (HOSPITAL_COMMUNITY): Payer: Self-pay | Admitting: Internal Medicine

## 2016-08-01 NOTE — Telephone Encounter (Signed)
*  Update* UHC - $25.00 co-pay, deductible $500/$500 has been met, out of pocket $2500/$2500 has been met, no co-insurance, no pre-authorization and no limit on visit. Passport/reference [phone].

## 2016-08-02 ENCOUNTER — Encounter (HOSPITAL_COMMUNITY)
Admission: RE | Admit: 2016-08-02 | Discharge: 2016-08-02 | Disposition: A | Discharge status: Home / Self Care | Payer: 59 | Source: Ambulatory Visit | Attending: Cardiovascular Disease | Admitting: Cardiovascular Disease

## 2016-08-02 ENCOUNTER — Encounter (HOSPITAL_COMMUNITY): Payer: Self-pay

## 2016-08-02 VITALS — BP 128/80 | HR 81 | Ht 66.5 in | Wt 215.4 lb

## 2016-08-02 DIAGNOSIS — Z7982 Long term (current) use of aspirin: Secondary | ICD-10-CM | POA: Diagnosis not present

## 2016-08-02 DIAGNOSIS — Z7902 Long term (current) use of antithrombotics/antiplatelets: Secondary | ICD-10-CM | POA: Insufficient documentation

## 2016-08-02 DIAGNOSIS — Z955 Presence of coronary angioplasty implant and graft: Secondary | ICD-10-CM | POA: Insufficient documentation

## 2016-08-02 DIAGNOSIS — M1990 Unspecified osteoarthritis, unspecified site: Secondary | ICD-10-CM | POA: Diagnosis not present

## 2016-08-02 DIAGNOSIS — I10 Essential (primary) hypertension: Secondary | ICD-10-CM | POA: Insufficient documentation

## 2016-08-02 DIAGNOSIS — Z79899 Other long term (current) drug therapy: Secondary | ICD-10-CM | POA: Diagnosis not present

## 2016-08-02 NOTE — Progress Notes (Signed)
Cardiac Rehab Medication Review by a Pharmacist  Does the patient  feel that his/her medications are working for him/her?  yes  Has the patient been experiencing any side effects to the medications prescribed?  no  Does the patient measure his/her own blood pressure or blood glucose at home?  no   Does the patient have any problems obtaining medications due to transportation or finances?   no  Understanding of regimen: fair Understanding of indications: fair Potential of compliance: excellent  Pharmacist comments: Kristina [name] is here for phase II cardiac rehab. She brought a med list with her that she referenced as we were going through. She denies missing any doses or having side effects. Of note, she is on lasix for swelling in the legs. I have informed the nurse here. She states this swelling didn't happen prior to amlodipine, but the lasix helps. She specifically denies any lightheadedness, lethargy, or bleeding issues. I counseled her to check her BP minimally 3x/week.   Kristina [name], Cain Sieve, PharmD Clinical Pharmacy Resident [phone] (Pager) 08/02/2016 8:54 AM

## 2016-08-02 NOTE — Progress Notes (Signed)
Cardiac Individual Treatment Plan  Patient Details  Name: Kristina [name] MRN: [id number] Date of Birth: February 23, 1946 Referring Provider:     CARDIAC REHAB PHASE II ORIENTATION from 08/02/2016 in [place]  Referring Provider  Quay Burow, MD.      Initial Encounter Date:    CARDIAC REHAB PHASE II ORIENTATION from 08/02/2016 in [place]  Date  08/02/16  Referring Provider  Quay Burow, MD.      Visit Diagnosis: 06/23/16 Status post coronary artery stent placement - S/P DES LAD 06/23/2016  Patient's Home Medications on Admission:  Current Outpatient Prescriptions:  .  amLODipine (NORVASC) 5 MG tablet, Take 5 mg by mouth daily., Disp: , Rfl:  .  aspirin 81 MG chewable tablet, Chew 1 tablet (81 mg total) by mouth daily., Disp: 30 tablet, Rfl: 0 .  atorvastatin (LIPITOR) 80 MG tablet, Take 1 tablet (80 mg total) by mouth daily at 6 PM., Disp: 30 tablet, Rfl: 0 .  Cholecalciferol (VITAMIN D PO), Take 1 capsule by mouth daily., Disp: , Rfl:  .  furosemide (LASIX) 20 MG tablet, Take 20 mg by mouth daily. , Disp: , Rfl:  .  losartan (COZAAR) 100 MG tablet, Take 100 mg by mouth daily. , Disp: , Rfl:  .  metoprolol tartrate (LOPRESSOR) 25 MG tablet, Take 0.5 tablets (12.5 mg total) by mouth 2 (two) times daily. (Patient taking differently: Take 25 mg by mouth 2 (two) times daily. ), Disp: 60 tablet, Rfl: 0 .  ticagrelor (BRILINTA) 90 MG TABS tablet, Take 1 tablet (90 mg total) by mouth 2 (two) times daily., Disp: 60 tablet, Rfl: 10 .  nitroGLYCERIN (NITROSTAT) 0.4 MG SL tablet, Place 1 tablet (0.4 mg total) under the tongue every 5 (five) minutes as needed for chest pain. (Patient not taking: Reported on 08/02/2016), Disp: 25 tablet, Rfl: 2  Past Medical History: Past Medical History:  Diagnosis Date  . Arthritis    "right arm" (06/22/2016)  . Carpal tunnel syndrome   . Childhood asthma   . Hypertension   . Plantar  fasciitis, bilateral    "had shots in them" (06/22/2016)  . Seizures ([place])    "when I was young" (06/22/2016)  . Wears glasses     Tobacco Use: History  Smoking Status  . Never Smoker  Smokeless Tobacco  . Never Used    Labs: Recent Review Flowsheet Data    Labs for ITP Cardiac and Pulmonary Rehab Latest Ref Rng & Units 02/19/2007 01/17/2010 01/26/2010 01/26/2010 06/24/2016   Cholestrol 0 - 200 mg/dL - 189 (NOTE) ATP III Classification:      < 200        mg/dL        Desirable     200 - 239     mg/dL        Borderline High     >= 240        mg/dL        High - - 215(H)   LDLCALC 0 - 99 mg/dL - 118 (NOTE)  Total Cholesterol/HDL Ratio:CHD Risk                       Coronary Heart Disease Risk Table  Men       Women         1/2 Average Risk              3.4        3.3             Average Risk              5.0 4.4         2 X Average Risk              9.6        7.1         3 X Average Risk             23.4       11.0 Use the calculated Patient Ratio above and the CHD Risk table  to determine the patient's CHD Risk. ATP III Classification (LDL):      < 100 mg/dL         Optimal     100 - 129     mg/dL         Near or Above Optimal     130 - 159     mg/dL         Borderline High     160 - 189     mg/dL         High      > 190        mg/dL         Very High(H) - - 96   HDL >40 mg/dL - 41 - - 58   Trlycerides <150 mg/dL - 149 - - 306(H)   Hemoglobin A1c <5.7 % - 6.2 (NOTE)                                                                       According to the ADA Clinical Practice Recommendations for 2011, when HbA1c is used as a screening test:   >=6.5%   Diagnostic of Diabetes Mellitus           (if abnormal result is confirmed)  5.7-6.4%   Increased risk of developing Diabetes Mellitus  References:Diagnosis and Classification of Diabetes Mellitus,Diabetes STMH,9622,29(NLGXQ 1):S62-S69 and Standards of Medical Care in         Diabetes - 2011,Diabetes  Care,2011,34 (Suppl 1):S11-S61.(H) - - -   HCO3 - 32.1(H) - - - -   TCO2 0 - 100 mmol/L 34 - 31 29 -      Capillary Blood Glucose: No results found for: GLUCAP   Exercise Target Goals: Date: 08/02/16  Exercise Program Goal: Individual exercise prescription set with THRR, safety & activity barriers. Participant demonstrates ability to understand and report RPE using BORG scale, to self-measure pulse accurately, and to acknowledge the importance of the exercise prescription.  Exercise Prescription Goal: Starting with aerobic activity 30 plus minutes a day, 3 days per week for initial exercise prescription. Provide home exercise prescription and guidelines that participant acknowledges understanding prior to discharge.  Activity Barriers & Risk Stratification:     Activity Barriers & Cardiac Risk Stratification - 08/02/16 0830      Activity Barriers & Cardiac Risk Stratification   Activity Barriers  Back Problems   Cardiac Risk Stratification Moderate      6 Minute Walk:     6 Minute Walk    Row Name 08/02/16 1508         6 Minute Walk   Phase Initial     Distance 1609 feet     Walk Time 6 minutes     # of Rest Breaks 0     MPH 3.05     METS 3.29     RPE 11     VO2 Peak 11.53     Symptoms No     Resting HR 81 bpm     Resting BP 128/80     Max Ex. HR 107 bpm     Max Ex. BP 140/90     2 Minute Post BP 138/86        Oxygen Initial Assessment:   Oxygen Re-Evaluation:   Oxygen Discharge (Final Oxygen Re-Evaluation):   Initial Exercise Prescription:     Initial Exercise Prescription - 08/02/16 1500      Date of Initial Exercise RX and Referring Provider   Date 08/02/16   Referring Provider Quay Burow, MD.     Treadmill   MPH 2.3   Grade 1   Minutes 10   METs 3.08     Bike   Level 1.2   Minutes 10   METs 3.32     NuStep   Level 3   SPM 85   Minutes 10   METs 2.8     Prescription Details   Frequency (times per week) 3   Duration  Progress to 30 minutes of continuous aerobic without signs/symptoms of physical distress     Intensity   THRR 40-80% of Max Heartrate 61-122   Ratings of Perceived Exertion 11-13   Perceived Dyspnea 0-4     Progression   Progression Continue to progress workloads to maintain intensity without signs/symptoms of physical distress.     Resistance Training   Training Prescription Yes   Weight 3lbs.   Reps 10-15      Perform Capillary Blood Glucose checks as needed.  Exercise Prescription Changes:   Exercise Comments:   Exercise Goals and Review:      Exercise Goals    Row Name 08/02/16 0831             Exercise Goals   Increase Physical Activity Yes       Intervention Provide advice, education, support and counseling about physical activity/exercise needs.;Develop an individualized exercise prescription for aerobic and resistive training based on initial evaluation findings, risk stratification, comorbidities and participant's personal goals.       Expected Outcomes Achievement of increased cardiorespiratory fitness and enhanced flexibility, muscular endurance and strength shown through measurements of functional capacity and personal statement of participant.       Increase Strength and Stamina Yes       Intervention Provide advice, education, support and counseling about physical activity/exercise needs.;Develop an individualized exercise prescription for aerobic and resistive training based on initial evaluation findings, risk stratification, comorbidities and participant's personal goals.       Expected Outcomes Achievement of increased cardiorespiratory fitness and enhanced flexibility, muscular endurance and strength shown through measurements of functional capacity and personal statement of participant.          Exercise Goals Re-Evaluation :    Discharge Exercise Prescription (Final Exercise Prescription Changes):   Nutrition:  Target Goals: Understanding of  nutrition guidelines, daily intake of sodium <  1500mg , cholesterol 200mg , calories 30% from fat and 7% or less from saturated fats, daily to have 5 or more servings of fruits and vegetables.  Biometrics:     Pre Biometrics - 08/02/16 1511      Pre Biometrics   Height 5' 6.5" (1.689 m)   Weight 215 lb 6.2 oz (97.7 kg)   Waist Circumference 42.5 inches   Hip Circumference 49.5 inches   Waist to Hip Ratio 0.86 %   BMI (Calculated) 34.3   Triceps Skinfold 28 mm   % Body Fat 44.6 %   Grip Strength 35 kg   Flexibility 8 in   Single Leg Stand 15.93 seconds       Nutrition Therapy Plan and Nutrition Goals:   Nutrition Discharge: Nutrition Scores:   Nutrition Goals Re-Evaluation:   Nutrition Goals Re-Evaluation:   Nutrition Goals Discharge (Final Nutrition Goals Re-Evaluation):   Psychosocial: Target Goals: Acknowledge presence or absence of significant depression and/or stress, maximize coping skills, provide positive support system. Participant is able to verbalize types and ability to use techniques and skills needed for reducing stress and depression.  Initial Review & Psychosocial Screening:     Initial Psych Review & Screening - 08/02/16 [place]? Yes     Barriers   Psychosocial barriers to participate in program There are no identifiable barriers or psychosocial needs.     Screening Interventions   Interventions Encouraged to exercise      Quality of Life Scores:     Quality of Life - 08/02/16 0955      Quality of Life Scores   Health/Function Pre 22.8 %   Socioeconomic Pre 29.14 %   Psych/Spiritual Pre 28.29 %   Family Pre 20.4 %   GLOBAL Pre 24.88 %      PHQ-9: Recent Review Flowsheet Data    There is no flowsheet data to display.     Interpretation of Total Score  Total Score Depression Severity:  1-4 = Minimal depression, 5-9 = Mild depression, 10-14 = Moderate depression, 15-19 = Moderately severe  depression, 20-27 = Severe depression   Psychosocial Evaluation and Intervention:   Psychosocial Re-Evaluation:   Psychosocial Discharge (Final Psychosocial Re-Evaluation):   Vocational Rehabilitation: Provide vocational rehab assistance to qualifying candidates.   Vocational Rehab Evaluation & Intervention:     Vocational Rehab - 08/02/16 1722      Initial Vocational Rehab Evaluation & Intervention   Assessment shows need for Vocational Rehabilitation Yes      Education: Education Goals: Education classes will be provided on a weekly basis, covering required topics. Participant will state understanding/return demonstration of topics presented.  Learning Barriers/Preferences:     Learning Barriers/Preferences - 08/02/16 0830      Learning Barriers/Preferences   Learning Barriers Sight   Learning Preferences Verbal Instruction      Education Topics: Count Your Pulse:  -Group instruction provided by verbal instruction, demonstration, patient participation and written materials to support subject.  Instructors address importance of being able to find your pulse and how to count your pulse when at home without a heart monitor.  Patients get hands on experience counting their pulse with staff help and individually.   Heart Attack, Angina, and Risk Factor Modification:  -Group instruction provided by verbal instruction, video, and written materials to support subject.  Instructors address signs and symptoms of angina and heart attacks.    Also discuss risk factors for heart disease  and how to make changes to improve heart health risk factors.   Functional Fitness:  -Group instruction provided by verbal instruction, demonstration, patient participation, and written materials to support subject.  Instructors address safety measures for doing things around the house.  Discuss how to get up and down off the floor, how to pick things up properly, how to safely get out of a chair  without assistance, and balance training.   Meditation and Mindfulness:  -Group instruction provided by verbal instruction, patient participation, and written materials to support subject.  Instructor addresses importance of mindfulness and meditation practice to help reduce stress and improve awareness.  Instructor also leads participants through a meditation exercise.    Stretching for Flexibility and Mobility:  -Group instruction provided by verbal instruction, patient participation, and written materials to support subject.  Instructors lead participants through series of stretches that are designed to increase flexibility thus improving mobility.  These stretches are additional exercise for major muscle groups that are typically performed during regular warm up and cool down.   Hands Only CPR Anytime:  -Group instruction provided by verbal instruction, video, patient participation and written materials to support subject.  Instructors co-teach with AHA video for hands only CPR.  Participants get hands on experience with mannequins.   Nutrition I class: Heart Healthy Eating:  -Group instruction provided by PowerPoint slides, verbal discussion, and written materials to support subject matter. The instructor gives an explanation and review of the Therapeutic Lifestyle Changes diet recommendations, which includes a discussion on lipid goals, dietary fat, sodium, fiber, plant stanol/sterol esters, sugar, and the components of a well-balanced, healthy diet.   Nutrition II class: Lifestyle Skills:  -Group instruction provided by PowerPoint slides, verbal discussion, and written materials to support subject matter. The instructor gives an explanation and review of label reading, grocery shopping for heart health, heart healthy recipe modifications, and ways to make healthier choices when eating out.   Diabetes Question & Answer:  -Group instruction provided by PowerPoint slides, verbal  discussion, and written materials to support subject matter. The instructor gives an explanation and review of diabetes co-morbidities, pre- and post-prandial blood glucose goals, pre-exercise blood glucose goals, signs, symptoms, and treatment of hypoglycemia and hyperglycemia, and foot care basics.   Diabetes Blitz:  -Group instruction provided by PowerPoint slides, verbal discussion, and written materials to support subject matter. The instructor gives an explanation and review of the physiology behind type 1 and type 2 diabetes, diabetes medications and rational behind using different medications, pre- and post-prandial blood glucose recommendations and Hemoglobin A1c goals, diabetes diet, and exercise including blood glucose guidelines for exercising safely.    Portion Distortion:  -Group instruction provided by PowerPoint slides, verbal discussion, written materials, and food models to support subject matter. The instructor gives an explanation of serving size versus portion size, changes in portions sizes over the last 20 years, and what consists of a serving from each food group.   Stress Management:  -Group instruction provided by verbal instruction, video, and written materials to support subject matter.  Instructors review role of stress in heart disease and how to cope with stress positively.     Exercising on Your Own:  -Group instruction provided by verbal instruction, power point, and written materials to support subject.  Instructors discuss benefits of exercise, components of exercise, frequency and intensity of exercise, and end points for exercise.  Also discuss use of nitroglycerin and activating EMS.  Review options of places to exercise outside of  rehab.  Review guidelines for sex with heart disease.   Cardiac Drugs I:  -Group instruction provided by verbal instruction and written materials to support subject.  Instructor reviews cardiac drug classes: antiplatelets,  anticoagulants, beta blockers, and statins.  Instructor discusses reasons, side effects, and lifestyle considerations for each drug class.   Cardiac Drugs II:  -Group instruction provided by verbal instruction and written materials to support subject.  Instructor reviews cardiac drug classes: angiotensin converting enzyme inhibitors (ACE-I), angiotensin II receptor blockers (ARBs), nitrates, and calcium channel blockers.  Instructor discusses reasons, side effects, and lifestyle considerations for each drug class.   Anatomy and Physiology of the Circulatory System:  -Group instruction provided by verbal instruction, video, and written materials to support subject.  Reviews functional anatomy of heart, how it relates to various diagnoses, and what role the heart plays in the overall system.   Knowledge Questionnaire Score:     Knowledge Questionnaire Score - 08/02/16 0956      Knowledge Questionnaire Score   Pre Score 15/24      Core Components/Risk Factors/Patient Goals at Admission:     Personal Goals and Risk Factors at Admission - 08/02/16 1730      Core Components/Risk Factors/Patient Goals on Admission    Weight Management Yes;Obesity   Intervention Weight Management: Develop a combined nutrition and exercise program designed to reach desired caloric intake, while maintaining appropriate intake of nutrient and fiber, sodium and fats, and appropriate energy expenditure required for the weight goal.;Weight Management: Provide education and appropriate resources to help participant work on and attain dietary goals.;Weight Management/Obesity: Establish reasonable short term and long term weight goals.;Obesity: Provide education and appropriate resources to help participant work on and attain dietary goals.   Admit Weight 215 lb 6.2 oz (97.7 kg)   Goal Weight: Long Term 191 lb (86.6 kg)   Expected Outcomes Short Term: Continue to assess and modify interventions until short term weight  is achieved;Long Term: Adherence to nutrition and physical activity/exercise program aimed toward attainment of established weight goal;Weight Loss: Understanding of general recommendations for a balanced deficit meal plan, which promotes 1-2 lb weight loss per week and includes a negative energy balance of [phone] kcal/d   Hypertension Yes   Intervention Provide education on lifestyle modifcations including regular physical activity/exercise, weight management, moderate sodium restriction and increased consumption of fresh fruit, vegetables, and low fat dairy, alcohol moderation, and smoking cessation.;Monitor prescription use compliance.   Expected Outcomes Short Term: Continued assessment and intervention until BP is < 140/29mm HG in hypertensive participants. < 130/63mm HG in hypertensive participants with diabetes, heart failure or chronic kidney disease.;Long Term: Maintenance of blood pressure at goal levels.   Lipids Yes   Intervention Provide education and support for participant on nutrition & aerobic/resistive exercise along with prescribed medications to achieve LDL 70mg , HDL >40mg .   Expected Outcomes Short Term: Participant states understanding of desired cholesterol values and is compliant with medications prescribed. Participant is following exercise prescription and nutrition guidelines.;Long Term: Cholesterol controlled with medications as prescribed, with individualized exercise RX and with personalized nutrition plan. Value goals: LDL < 70mg , HDL > 40 mg.      Core Components/Risk Factors/Patient Goals Review:    Core Components/Risk Factors/Patient Goals at Discharge (Final Review):    ITP Comments:     ITP Comments    Row Name 08/02/16 0827           ITP Comments Medical Director, Dr. Fransico Him  Comments: Lindsay attended orientation from 0800 to 1000 to review rules and guidelines for program. Completed 6 minute walk test, Intitial ITP, and exercise  prescription.  VSS. Telemetry-Sinus Rhyhtm.  Asymptomatic.Barnet Pall, RN,BSN 08/02/2016 5:32 PM

## 2016-08-08 ENCOUNTER — Encounter (HOSPITAL_COMMUNITY): Payer: Self-pay

## 2016-08-08 ENCOUNTER — Encounter (HOSPITAL_COMMUNITY)
Admission: RE | Admit: 2016-08-08 | Discharge: 2016-08-08 | Disposition: A | Discharge status: Home / Self Care | Payer: 59 | Source: Ambulatory Visit | Attending: Cardiovascular Disease | Admitting: Cardiovascular Disease

## 2016-08-08 DIAGNOSIS — Z955 Presence of coronary angioplasty implant and graft: Secondary | ICD-10-CM | POA: Diagnosis not present

## 2016-08-08 NOTE — Progress Notes (Signed)
Daily Session Note  Patient Details  Name: Kristina [name] MRN: [id number] Date of Birth: 1945-06-30 Referring Provider:     [facility] from 08/02/2016 in [place]  Referring Provider  Quay Burow, MD.      Encounter Date: 08/08/2016  Check In:     Session Check In - 08/08/16 1522      Check-In   Location [facility]   Staff Present Su Hilt, MS, ACSM RCEP, Exercise Physiologist;Ashley Armstrong, MS, Exercise Physiologist;Brodi Kari, RN, BSN   Supervising physician immediately available to respond to emergencies [facility] immediately available   Physician(s) Dr. Posey Pronto   Medication changes reported     No   Fall or balance concerns reported    No   Tobacco Cessation No Change   Warm-up and Cool-down Performed as group-led instruction   Resistance Training Performed Yes   VAD Patient? No     Pain Assessment   Currently in Pain? No/denies   Multiple Pain Sites No      Capillary Blood Glucose: No results found for this or any previous visit (from the past 24 hour(s)).    History  Smoking Status  . Never Smoker  Smokeless Tobacco  . Never Used    Goals Met:  Exercise tolerated well  Goals Unmet:  Not Applicable  Comments: Pt started cardiac rehab today.  Pt tolerated light exercise without difficulty. VSS, telemetry-sinus rhythm,  asymptomatic.  Medication list reconciled. Pt denies barriers to medicaiton compliance.  PSYCHOSOCIAL ASSESSMENT:  PHQ-0. Pt exhibits positive coping skills, hopeful outlook with supportive family. No psychosocial needs identified at this time, no psychosocial interventions necessary.    Pt enjoys dancing.pt goals for cardiac rehab are to lose weight with optimal goal being 30 lb weight loss. Pt encouraged to participate in home exercise and nutrition education in addition to cardiac rehab sessions to optimize ability to reach this goal.    Pt  oriented to exercise equipment and routine.    Understanding verbalized.   Dr. Fransico Him is Medical Director for Cardiac Rehab at [facility].

## 2016-08-10 ENCOUNTER — Encounter (HOSPITAL_COMMUNITY)
Admission: RE | Admit: 2016-08-10 | Discharge: 2016-08-10 | Disposition: A | Discharge status: Home / Self Care | Payer: 59 | Source: Ambulatory Visit | Attending: Cardiovascular Disease | Admitting: Cardiovascular Disease

## 2016-08-10 DIAGNOSIS — Z7902 Long term (current) use of antithrombotics/antiplatelets: Secondary | ICD-10-CM | POA: Diagnosis not present

## 2016-08-10 DIAGNOSIS — Z79899 Other long term (current) drug therapy: Secondary | ICD-10-CM | POA: Insufficient documentation

## 2016-08-10 DIAGNOSIS — M1990 Unspecified osteoarthritis, unspecified site: Secondary | ICD-10-CM | POA: Insufficient documentation

## 2016-08-10 DIAGNOSIS — Z7982 Long term (current) use of aspirin: Secondary | ICD-10-CM | POA: Diagnosis not present

## 2016-08-10 DIAGNOSIS — Z955 Presence of coronary angioplasty implant and graft: Secondary | ICD-10-CM | POA: Insufficient documentation

## 2016-08-10 DIAGNOSIS — I10 Essential (primary) hypertension: Secondary | ICD-10-CM | POA: Insufficient documentation

## 2016-08-12 ENCOUNTER — Encounter (HOSPITAL_COMMUNITY)
Admission: RE | Admit: 2016-08-12 | Discharge: 2016-08-12 | Disposition: A | Discharge status: Home / Self Care | Payer: 59 | Source: Ambulatory Visit | Attending: Cardiovascular Disease | Admitting: Cardiovascular Disease

## 2016-08-12 DIAGNOSIS — Z955 Presence of coronary angioplasty implant and graft: Secondary | ICD-10-CM

## 2016-08-15 ENCOUNTER — Telehealth (HOSPITAL_COMMUNITY): Payer: Self-pay | Admitting: Internal Medicine

## 2016-08-15 ENCOUNTER — Encounter (HOSPITAL_COMMUNITY): Payer: 59

## 2016-08-17 ENCOUNTER — Telehealth (HOSPITAL_COMMUNITY): Payer: Self-pay | Admitting: Internal Medicine

## 2016-08-17 ENCOUNTER — Encounter (HOSPITAL_COMMUNITY): Payer: 59

## 2016-08-19 ENCOUNTER — Telehealth (HOSPITAL_COMMUNITY): Payer: Self-pay | Admitting: *Deleted

## 2016-08-19 ENCOUNTER — Encounter (HOSPITAL_COMMUNITY): Admission: RE | Admit: 2016-08-19 | Payer: 59 | Source: Ambulatory Visit

## 2016-08-22 ENCOUNTER — Encounter (HOSPITAL_COMMUNITY)
Admission: RE | Admit: 2016-08-22 | Discharge: 2016-08-22 | Disposition: A | Discharge status: Home / Self Care | Payer: 59 | Source: Ambulatory Visit | Attending: Cardiovascular Disease | Admitting: Cardiovascular Disease

## 2016-08-22 DIAGNOSIS — Z955 Presence of coronary angioplasty implant and graft: Secondary | ICD-10-CM | POA: Diagnosis not present

## 2016-08-22 NOTE — Progress Notes (Signed)
Kristina [name] 71 y.o. female Nutrition Note Spoke with pt.  Nutrition Survey reviewed with pt. Pt is following Step 1 of the Therapeutic Lifestyle Changes diet. Pt expressed understanding of the information reviewed. Pt aware of nutrition education classes offered and plans on attending nutrition classes.  Wt Readings from Last 3 Encounters:  08/02/16 215 lb 6.2 oz (97.7 kg)  07/12/16 216 lb 12.8 oz (98.3 kg)  06/24/16 209 lb 7 oz (95 kg)   Nutrition Diagnosis ? Food-and nutrition-related knowledge deficit related to lack of exposure to information as related to diagnosis of: ? CVD  ? Obesity related to excessive energy intake as evidenced by a BMI of 34.3 Nutrition Intervention ? Benefits of adopting Therapeutic Lifestyle Changes discussed when Medficts reviewed. ? Pt to attend the Portion Distortion class ? Pt to attend the  ? Nutrition I class                     ? Nutrition II class ? Continue client-centered nutrition education by RD, as part of interdisciplinary care.  Goal(s) ? Pt to identify and limit food sources of saturated fat, trans fat, and sodium ? Pt to identify food quantities necessary to achieve weight loss of 6-24 lb (2.7-10.9 kg) at graduation from cardiac rehab.   Monitor and Evaluate progress toward nutrition goal with team.  Derek Mound, M.Ed, RD, LDN, CDE 08/22/2016 4:06 PM

## 2016-08-24 ENCOUNTER — Encounter (HOSPITAL_COMMUNITY)
Admission: RE | Admit: 2016-08-24 | Discharge: 2016-08-24 | Disposition: A | Discharge status: Home / Self Care | Payer: 59 | Source: Ambulatory Visit | Attending: Cardiovascular Disease | Admitting: Cardiovascular Disease

## 2016-08-24 DIAGNOSIS — Z955 Presence of coronary angioplasty implant and graft: Secondary | ICD-10-CM | POA: Diagnosis not present

## 2016-08-26 ENCOUNTER — Encounter (HOSPITAL_COMMUNITY)
Admission: RE | Admit: 2016-08-26 | Discharge: 2016-08-26 | Disposition: A | Discharge status: Home / Self Care | Payer: 59 | Source: Ambulatory Visit | Attending: Cardiovascular Disease | Admitting: Cardiovascular Disease

## 2016-08-26 DIAGNOSIS — Z955 Presence of coronary angioplasty implant and graft: Secondary | ICD-10-CM

## 2016-08-29 ENCOUNTER — Encounter (HOSPITAL_COMMUNITY): Payer: 59

## 2016-08-31 ENCOUNTER — Encounter (HOSPITAL_COMMUNITY): Payer: 59

## 2016-08-31 ENCOUNTER — Encounter (HOSPITAL_COMMUNITY): Payer: Self-pay | Admitting: *Deleted

## 2016-08-31 DIAGNOSIS — Z955 Presence of coronary angioplasty implant and graft: Secondary | ICD-10-CM

## 2016-08-31 NOTE — Progress Notes (Signed)
Cardiac Individual Treatment Plan  Patient Details  Name: Kristina [name] MRN: [id number] Date of Birth: [birth year] Referring Provider:     CARDIAC REHAB PHASE II ORIENTATION from 08/02/2016 in [place]  Referring Provider  Quay Burow, MD.      Initial Encounter Date:    CARDIAC REHAB PHASE II ORIENTATION from 08/02/2016 in [place]  Date  08/02/16  Referring Provider  Quay Burow, MD.      Visit Diagnosis: 06/23/16 Status post coronary artery stent placement  Patient's Home Medications on Admission:  Current Outpatient Prescriptions:  .  amLODipine (NORVASC) 5 MG tablet, Take 5 mg by mouth daily., Disp: , Rfl:  .  aspirin 81 MG chewable tablet, Chew 1 tablet (81 mg total) by mouth daily., Disp: 30 tablet, Rfl: 0 .  atorvastatin (LIPITOR) 80 MG tablet, Take 1 tablet (80 mg total) by mouth daily at 6 PM., Disp: 30 tablet, Rfl: 0 .  Cholecalciferol (VITAMIN D PO), Take 1 capsule by mouth daily., Disp: , Rfl:  .  furosemide (LASIX) 20 MG tablet, Take 20 mg by mouth daily. , Disp: , Rfl:  .  losartan (COZAAR) 100 MG tablet, Take 100 mg by mouth daily. , Disp: , Rfl:  .  metoprolol tartrate (LOPRESSOR) 25 MG tablet, Take 0.5 tablets (12.5 mg total) by mouth 2 (two) times daily. (Patient taking differently: Take 25 mg by mouth 2 (two) times daily. ), Disp: 60 tablet, Rfl: 0 .  nitroGLYCERIN (NITROSTAT) 0.4 MG SL tablet, Place 1 tablet (0.4 mg total) under the tongue every 5 (five) minutes as needed for chest pain., Disp: 25 tablet, Rfl: 2 .  ticagrelor (BRILINTA) 90 MG TABS tablet, Take 1 tablet (90 mg total) by mouth 2 (two) times daily., Disp: 60 tablet, Rfl: 10  Past Medical History: Past Medical History:  Diagnosis Date  . Arthritis    "right arm" (06/22/2016)  . Carpal tunnel syndrome   . Childhood asthma   . Hypertension   . Plantar fasciitis, bilateral    "had shots in them" (06/22/2016)  . Seizures  ([place])    "when I was young" (06/22/2016)  . Wears glasses     Tobacco Use: History  Smoking Status  . Never Smoker  Smokeless Tobacco  . Never Used    Labs: Recent Review Flowsheet Data    Labs for ITP Cardiac and Pulmonary Rehab Latest Ref Rng & Units 02/19/2007 01/17/2010 01/26/2010 01/26/2010 06/24/2016   Cholestrol 0 - 200 mg/dL - 189 (NOTE) ATP III Classification:      < 200        mg/dL        Desirable     200 - 239     mg/dL        Borderline High     >= 240        mg/dL        High - - 215(H)   LDLCALC 0 - 99 mg/dL - 118 (NOTE)  Total Cholesterol/HDL Ratio:CHD Risk                       Coronary Heart Disease Risk Table                                       Men       Women  1/2 Average Risk              3.4        3.3             Average Risk              5.0 4.4         2 X Average Risk              9.6        7.1         3 X Average Risk             23.4       11.0 Use the calculated Patient Ratio above and the CHD Risk table  to determine the patient's CHD Risk. ATP III Classification (LDL):      < 100 mg/dL         Optimal     100 - 129     mg/dL         Near or Above Optimal     130 - 159     mg/dL         Borderline High     160 - 189     mg/dL         High      > 190        mg/dL         Very High(H) - - 96   HDL >40 mg/dL - 41 - - 58   Trlycerides <150 mg/dL - 149 - - 306(H)   Hemoglobin A1c <5.7 % - 6.2 (NOTE)                                                                       According to the ADA Clinical Practice Recommendations for 2011, when HbA1c is used as a screening test:   >=6.5%   Diagnostic of Diabetes Mellitus           (if abnormal result is confirmed)  5.7-6.4%   Increased risk of developing Diabetes Mellitus  References:Diagnosis and Classification of Diabetes Mellitus,Diabetes RFFM,3846,65(LDJTT 1):S62-S69 and Standards of Medical Care in         Diabetes - 2011,Diabetes Care,2011,34 (Suppl 1):S11-S61.(H) - - -   HCO3 - 32.1(H) - - - -   TCO2 0 -  100 mmol/L 34 - 31 29 -      Capillary Blood Glucose: No results found for: GLUCAP   Exercise Target Goals:    Exercise Program Goal: Individual exercise prescription set with THRR, safety & activity barriers. Participant demonstrates ability to understand and report RPE using BORG scale, to self-measure pulse accurately, and to acknowledge the importance of the exercise prescription.  Exercise Prescription Goal: Starting with aerobic activity 30 plus minutes a day, 3 days per week for initial exercise prescription. Provide home exercise prescription and guidelines that participant acknowledges understanding prior to discharge.  Activity Barriers & Risk Stratification:     Activity Barriers & Cardiac Risk Stratification - 08/02/16 0830      Activity Barriers & Cardiac Risk Stratification   Activity Barriers Back Problems   Cardiac Risk Stratification Moderate      6 Minute Walk:  6 Minute Walk    Row Name 08/02/16 1508         6 Minute Walk   Phase Initial     Distance 1609 feet     Walk Time 6 minutes     # of Rest Breaks 0     MPH 3.05     METS 3.29     RPE 11     VO2 Peak 11.53     Symptoms No     Resting HR 81 bpm     Resting BP 128/80     Max Ex. HR 107 bpm     Max Ex. BP 140/90     2 Minute Post BP 138/86        Oxygen Initial Assessment:   Oxygen Re-Evaluation:   Oxygen Discharge (Final Oxygen Re-Evaluation):   Initial Exercise Prescription:     Initial Exercise Prescription - 08/02/16 1500      Date of Initial Exercise RX and Referring Provider   Date 08/02/16   Referring Provider Quay Burow, MD.     Treadmill   MPH 2.3   Grade 1   Minutes 10   METs 3.08     Bike   Level 1.2   Minutes 10   METs 3.32     NuStep   Level 3   SPM 85   Minutes 10   METs 2.8     Prescription Details   Frequency (times per week) 3   Duration Progress to 30 minutes of continuous aerobic without signs/symptoms of physical distress      Intensity   THRR 40-80% of Max Heartrate 61-122   Ratings of Perceived Exertion 11-13   Perceived Dyspnea 0-4     Progression   Progression Continue to progress workloads to maintain intensity without signs/symptoms of physical distress.     Resistance Training   Training Prescription Yes   Weight 3lbs.   Reps 10-15      Perform Capillary Blood Glucose checks as needed.  Exercise Prescription Changes:     Exercise Prescription Changes    Row Name 08/18/16 1600             Response to Exercise   Blood Pressure (Admit) 120/86       Blood Pressure (Exercise) 122/76       Blood Pressure (Exit) 138/74       Heart Rate (Admit) 81 bpm       Heart Rate (Exercise) 121 bpm       Heart Rate (Exit) 81 bpm       Rating of Perceived Exertion (Exercise) 12       Duration Progress to 45 minutes of aerobic exercise without signs/symptoms of physical distress       Intensity THRR unchanged         Progression   Progression Continue to progress workloads to maintain intensity without signs/symptoms of physical distress.       Average METs 2.7         Resistance Training   Training Prescription Yes       Weight 2lb       Reps 10-15         Treadmill   MPH 2.3       Grade 1       Minutes 10       METs 3.08         Bike   Level 1.2       Minutes 10  METs 3.32         NuStep   Level 3       SPM 85       Minutes 10       METs 1.9          Exercise Comments:     Exercise Comments    Row Name 08/25/16 1556           Exercise Comments Reviewed goals with pt.  Pt is doing well with exercise.           Exercise Goals and Review:     Exercise Goals    Row Name 08/02/16 0831             Exercise Goals   Increase Physical Activity Yes       Intervention Provide advice, education, support and counseling about physical activity/exercise needs.;Develop an individualized exercise prescription for aerobic and resistive training based on initial evaluation  findings, risk stratification, comorbidities and participant's personal goals.       Expected Outcomes Achievement of increased cardiorespiratory fitness and enhanced flexibility, muscular endurance and strength shown through measurements of functional capacity and personal statement of participant.       Increase Strength and Stamina Yes       Intervention Provide advice, education, support and counseling about physical activity/exercise needs.;Develop an individualized exercise prescription for aerobic and resistive training based on initial evaluation findings, risk stratification, comorbidities and participant's personal goals.       Expected Outcomes Achievement of increased cardiorespiratory fitness and enhanced flexibility, muscular endurance and strength shown through measurements of functional capacity and personal statement of participant.          Exercise Goals Re-Evaluation :     Exercise Goals Re-Evaluation    Row Name 08/25/16 1555             Exercise Goal Re-Evaluation   Exercise Goals Review Increase Physical Activity;Increase Strenth and Stamina       Comments Pt states she hasn't lost any weight, and she's not gaining, but she states she notices that her body feels different and she feels great       Expected Outcomes continue with exercise Rx, attend education classes and gradually increase workloads in order to promote weight loss and increase cardiorespiratory fitness level           Discharge Exercise Prescription (Final Exercise Prescription Changes):     Exercise Prescription Changes - 08/18/16 1600      Response to Exercise   Blood Pressure (Admit) 120/86   Blood Pressure (Exercise) 122/76   Blood Pressure (Exit) 138/74   Heart Rate (Admit) 81 bpm   Heart Rate (Exercise) 121 bpm   Heart Rate (Exit) 81 bpm   Rating of Perceived Exertion (Exercise) 12   Duration Progress to 45 minutes of aerobic exercise without signs/symptoms of physical distress    Intensity THRR unchanged     Progression   Progression Continue to progress workloads to maintain intensity without signs/symptoms of physical distress.   Average METs 2.7     Resistance Training   Training Prescription Yes   Weight 2lb   Reps 10-15     Treadmill   MPH 2.3   Grade 1   Minutes 10   METs 3.08     Bike   Level 1.2   Minutes 10   METs 3.32     NuStep   Level 3   SPM 85   Minutes 10  METs 1.9      Nutrition:  Target Goals: Understanding of nutrition guidelines, daily intake of sodium 1500mg , cholesterol 200mg , calories 30% from fat and 7% or less from saturated fats, daily to have 5 or more servings of fruits and vegetables.  Biometrics:     Pre Biometrics - 08/02/16 1511      Pre Biometrics   Height 5' 6.5" (1.689 m)   Weight 215 lb 6.2 oz (97.7 kg)   Waist Circumference 42.5 inches   Hip Circumference 49.5 inches   Waist to Hip Ratio 0.86 %   BMI (Calculated) 34.3   Triceps Skinfold 28 mm   % Body Fat 44.6 %   Grip Strength 35 kg   Flexibility 8 in   Single Leg Stand 15.93 seconds       Nutrition Therapy Plan and Nutrition Goals:     Nutrition Therapy & Goals - 08/23/16 1232      Nutrition Therapy   Diet Therapeutic Lifestyle Changes     Personal Nutrition Goals   Nutrition Goal Pt to identify and limit food sources of saturated fat, trans fat, and sodium.    Personal Goal #2 Pt to lose 1-2 lb/week to a wt loss goal of 6-24 lb at graduation from rehab     [place], educate and counsel regarding individualized specific dietary modifications aiming towards targeted core components such as weight, hypertension, lipid management, diabetes, heart failure and other comorbidities.   Expected Outcomes Short Term Goal: Understand basic principles of dietary content, such as calories, fat, sodium, cholesterol and nutrients.;Long Term Goal: Adherence to prescribed nutrition plan.      Nutrition Discharge:  Nutrition Scores:     Nutrition Assessments - 08/23/16 1230      MEDFICTS Scores   Pre Score 44      Nutrition Goals Re-Evaluation:   Nutrition Goals Re-Evaluation:   Nutrition Goals Discharge (Final Nutrition Goals Re-Evaluation):   Psychosocial: Target Goals: Acknowledge presence or absence of significant depression and/or stress, maximize coping skills, provide positive support system. Participant is able to verbalize types and ability to use techniques and skills needed for reducing stress and depression.  Initial Review & Psychosocial Screening:     Initial Psych Review & Screening - 08/02/16 [place]? Yes     Barriers   Psychosocial barriers to participate in program There are no identifiable barriers or psychosocial needs.     Screening Interventions   Interventions Encouraged to exercise      Quality of Life Scores:     Quality of Life - 08/02/16 0955      Quality of Life Scores   Health/Function Pre 22.8 %   Socioeconomic Pre 29.14 %   Psych/Spiritual Pre 28.29 %   Family Pre 20.4 %   GLOBAL Pre 24.88 %      PHQ-9: Recent Review Flowsheet Data    There is no flowsheet data to display.     Interpretation of Total Score  Total Score Depression Severity:  1-4 = Minimal depression, 5-9 = Mild depression, 10-14 = Moderate depression, 15-19 = Moderately severe depression, 20-27 = Severe depression   Psychosocial Evaluation and Intervention:     Psychosocial Evaluation - 08/08/16 1647      Psychosocial Evaluation & Interventions   Interventions Encouraged to exercise with the program and follow exercise prescription   Comments no psychosocial needs identified, no internvetions necessary.  Expected Outcomes pt will exhibit positive outlook with good coping skills.    Continue Psychosocial Services  No Follow up required      Psychosocial Re-Evaluation:     Psychosocial Re-Evaluation    [place] Name  08/31/16 1742             Psychosocial Re-Evaluation   Current issues with None Identified       Interventions Encouraged to attend Cardiac Rehabilitation for the exercise       Continue Psychosocial Services  No Follow up required          Psychosocial Discharge (Final Psychosocial Re-Evaluation):     Psychosocial Re-Evaluation - 08/31/16 1742      Psychosocial Re-Evaluation   Current issues with None Identified   Interventions Encouraged to attend Cardiac Rehabilitation for the exercise   Continue Psychosocial Services  No Follow up required      Vocational Rehabilitation: Provide vocational rehab assistance to qualifying candidates.   Vocational Rehab Evaluation & Intervention:     Vocational Rehab - 08/02/16 1722      Initial Vocational Rehab Evaluation & Intervention   Assessment shows need for Vocational Rehabilitation Yes      Education: Education Goals: Education classes will be provided on a weekly basis, covering required topics. Participant will state understanding/return demonstration of topics presented.  Learning Barriers/Preferences:     Learning Barriers/Preferences - 08/02/16 0830      Learning Barriers/Preferences   Learning Barriers Sight   Learning Preferences Verbal Instruction      Education Topics: Count Your Pulse:  -Group instruction provided by verbal instruction, demonstration, patient participation and written materials to support subject.  Instructors address importance of being able to find your pulse and how to count your pulse when at home without a heart monitor.  Patients get hands on experience counting their pulse with staff help and individually.   CARDIAC REHAB PHASE II EXERCISE from 08/26/2016 in [place]  Date  08/12/16  Instruction Review Code  2- meets goals/outcomes      Heart Attack, Angina, and Risk Factor Modification:  -Group instruction provided by verbal instruction, video,  and written materials to support subject.  Instructors address signs and symptoms of angina and heart attacks.    Also discuss risk factors for heart disease and how to make changes to improve heart health risk factors.   Functional Fitness:  -Group instruction provided by verbal instruction, demonstration, patient participation, and written materials to support subject.  Instructors address safety measures for doing things around the house.  Discuss how to get up and down off the floor, how to pick things up properly, how to safely get out of a chair without assistance, and balance training.   CARDIAC REHAB PHASE II EXERCISE from 08/26/2016 in [place]  Date  08/26/16  Instruction Review Code  2- meets goals/outcomes      Meditation and Mindfulness:  -Group instruction provided by verbal instruction, patient participation, and written materials to support subject.  Instructor addresses importance of mindfulness and meditation practice to help reduce stress and improve awareness.  Instructor also leads participants through a meditation exercise.    Stretching for Flexibility and Mobility:  -Group instruction provided by verbal instruction, patient participation, and written materials to support subject.  Instructors lead participants through series of stretches that are designed to increase flexibility thus improving mobility.  These stretches are additional exercise for major muscle groups that are typically  performed during regular warm up and cool down.   Hands Only CPR:  -Group verbal, video, and participation provides a basic overview of AHA guidelines for community CPR. Role-play of emergencies allow participants the opportunity to practice calling for help and chest compression technique with discussion of AED use.   Hypertension: -Group verbal and written instruction that provides a basic overview of hypertension including the most recent diagnostic  guidelines, risk factor reduction with self-care instructions and medication management.    Nutrition I class: Heart Healthy Eating:  -Group instruction provided by PowerPoint slides, verbal discussion, and written materials to support subject matter. The instructor gives an explanation and review of the Therapeutic Lifestyle Changes diet recommendations, which includes a discussion on lipid goals, dietary fat, sodium, fiber, plant stanol/sterol esters, sugar, and the components of a well-balanced, healthy diet.   Nutrition II class: Lifestyle Skills:  -Group instruction provided by PowerPoint slides, verbal discussion, and written materials to support subject matter. The instructor gives an explanation and review of label reading, grocery shopping for heart health, heart healthy recipe modifications, and ways to make healthier choices when eating out.   Diabetes Question & Answer:  -Group instruction provided by PowerPoint slides, verbal discussion, and written materials to support subject matter. The instructor gives an explanation and review of diabetes co-morbidities, pre- and post-prandial blood glucose goals, pre-exercise blood glucose goals, signs, symptoms, and treatment of hypoglycemia and hyperglycemia, and foot care basics.   Diabetes Blitz:  -Group instruction provided by PowerPoint slides, verbal discussion, and written materials to support subject matter. The instructor gives an explanation and review of the physiology behind type 1 and type 2 diabetes, diabetes medications and rational behind using different medications, pre- and post-prandial blood glucose recommendations and Hemoglobin A1c goals, diabetes diet, and exercise including blood glucose guidelines for exercising safely.    Portion Distortion:  -Group instruction provided by PowerPoint slides, verbal discussion, written materials, and food models to support subject matter. The instructor gives an explanation of serving  size versus portion size, changes in portions sizes over the last 20 years, and what consists of a serving from each food group.   Stress Management:  -Group instruction provided by verbal instruction, video, and written materials to support subject matter.  Instructors review role of stress in heart disease and how to cope with stress positively.     CARDIAC REHAB PHASE II EXERCISE from 08/26/2016 in [place]  Date  08/10/16  Instruction Review Code  2- meets goals/outcomes      Exercising on Your Own:  -Group instruction provided by verbal instruction, power point, and written materials to support subject.  Instructors discuss benefits of exercise, components of exercise, frequency and intensity of exercise, and end points for exercise.  Also discuss use of nitroglycerin and activating EMS.  Review options of places to exercise outside of rehab.  Review guidelines for sex with heart disease.   CARDIAC REHAB PHASE II EXERCISE from 08/26/2016 in [place]  Date  08/24/16  Instruction Review Code  2- meets goals/outcomes      Cardiac Drugs I:  -Group instruction provided by verbal instruction and written materials to support subject.  Instructor reviews cardiac drug classes: antiplatelets, anticoagulants, beta blockers, and statins.  Instructor discusses reasons, side effects, and lifestyle considerations for each drug class.   Cardiac Drugs II:  -Group instruction provided by verbal instruction and written materials to support subject.  Instructor reviews cardiac drug  classes: angiotensin converting enzyme inhibitors (ACE-I), angiotensin II receptor blockers (ARBs), nitrates, and calcium channel blockers.  Instructor discusses reasons, side effects, and lifestyle considerations for each drug class.   Anatomy and Physiology of the Circulatory System:  Group verbal and written instruction and models provide basic cardiac  anatomy and physiology, with the coronary electrical and arterial systems. Review of: AMI, Angina, Valve disease, Heart Failure, Peripheral Artery Disease, Cardiac Arrhythmia, Pacemakers, and the ICD.   Other Education:  -Group or individual verbal, written, or video instructions that support the educational goals of the cardiac rehab program.   Knowledge Questionnaire Score:     Knowledge Questionnaire Score - 08/02/16 0956      Knowledge Questionnaire Score   Pre Score 15/24      Core Components/Risk Factors/Patient Goals at Admission:     Personal Goals and Risk Factors at Admission - 08/02/16 1730      Core Components/Risk Factors/Patient Goals on Admission    Weight Management Yes;Obesity   Intervention Weight Management: Develop a combined nutrition and exercise program designed to reach desired caloric intake, while maintaining appropriate intake of nutrient and fiber, sodium and fats, and appropriate energy expenditure required for the weight goal.;Weight Management: Provide education and appropriate resources to help participant work on and attain dietary goals.;Weight Management/Obesity: Establish reasonable short term and long term weight goals.;Obesity: Provide education and appropriate resources to help participant work on and attain dietary goals.   Admit Weight 215 lb 6.2 oz (97.7 kg)   Goal Weight: Long Term 191 lb (86.6 kg)   Expected Outcomes Short Term: Continue to assess and modify interventions until short term weight is achieved;Long Term: Adherence to nutrition and physical activity/exercise program aimed toward attainment of established weight goal;Weight Loss: Understanding of general recommendations for a balanced deficit meal plan, which promotes 1-2 lb weight loss per week and includes a negative energy balance of [phone] kcal/d   Hypertension Yes   Intervention Provide education on lifestyle modifcations including regular physical activity/exercise, weight  management, moderate sodium restriction and increased consumption of fresh fruit, vegetables, and low fat dairy, alcohol moderation, and smoking cessation.;Monitor prescription use compliance.   Expected Outcomes Short Term: Continued assessment and intervention until BP is < 140/70mm HG in hypertensive participants. < 130/35mm HG in hypertensive participants with diabetes, heart failure or chronic kidney disease.;Long Term: Maintenance of blood pressure at goal levels.   Lipids Yes   Intervention Provide education and support for participant on nutrition & aerobic/resistive exercise along with prescribed medications to achieve LDL 70mg , HDL >40mg .   Expected Outcomes Short Term: Participant states understanding of desired cholesterol values and is compliant with medications prescribed. Participant is following exercise prescription and nutrition guidelines.;Long Term: Cholesterol controlled with medications as prescribed, with individualized exercise RX and with personalized nutrition plan. Value goals: LDL < 70mg , HDL > 40 mg.      Core Components/Risk Factors/Patient Goals Review:    Core Components/Risk Factors/Patient Goals at Discharge (Final Review):    ITP Comments:     ITP Comments    Row Name 08/02/16 0827           ITP Comments Medical Director, Dr. Fransico Him          Comments: Lanetta Inch is making expected progress toward personal goals after completing 6 sessions. Recommend continued exercise and life style modification edt ucation including  stress management and relaxation techniques to decrease cardiac risk profile. Lauvon's attendance has been fair . Lauvon is absent from cardiac  rehab this week due to a work obligation.Barnet Pall, RN,BSN 08/31/2016 5:47 PM

## 2016-09-02 ENCOUNTER — Encounter (HOSPITAL_COMMUNITY): Payer: 59

## 2016-09-07 ENCOUNTER — Encounter (HOSPITAL_COMMUNITY)
Admission: RE | Admit: 2016-09-07 | Discharge: 2016-09-07 | Disposition: A | Discharge status: Home / Self Care | Payer: 59 | Source: Ambulatory Visit | Attending: Cardiovascular Disease | Admitting: Cardiovascular Disease

## 2016-09-07 DIAGNOSIS — Z955 Presence of coronary angioplasty implant and graft: Secondary | ICD-10-CM | POA: Diagnosis not present

## 2016-09-09 ENCOUNTER — Encounter (HOSPITAL_COMMUNITY)
Admission: RE | Admit: 2016-09-09 | Discharge: 2016-09-09 | Disposition: A | Discharge status: Home / Self Care | Payer: 59 | Source: Ambulatory Visit | Attending: Cardiovascular Disease | Admitting: Cardiovascular Disease

## 2016-09-09 DIAGNOSIS — Z7982 Long term (current) use of aspirin: Secondary | ICD-10-CM | POA: Insufficient documentation

## 2016-09-09 DIAGNOSIS — Z955 Presence of coronary angioplasty implant and graft: Secondary | ICD-10-CM

## 2016-09-09 DIAGNOSIS — Z7902 Long term (current) use of antithrombotics/antiplatelets: Secondary | ICD-10-CM | POA: Insufficient documentation

## 2016-09-09 DIAGNOSIS — Z79899 Other long term (current) drug therapy: Secondary | ICD-10-CM | POA: Diagnosis not present

## 2016-09-09 DIAGNOSIS — I10 Essential (primary) hypertension: Secondary | ICD-10-CM | POA: Diagnosis not present

## 2016-09-09 DIAGNOSIS — M1990 Unspecified osteoarthritis, unspecified site: Secondary | ICD-10-CM | POA: Diagnosis not present

## 2016-09-12 ENCOUNTER — Encounter (HOSPITAL_COMMUNITY)
Admission: RE | Admit: 2016-09-12 | Discharge: 2016-09-12 | Disposition: A | Discharge status: Home / Self Care | Payer: 59 | Source: Ambulatory Visit | Attending: Cardiovascular Disease | Admitting: Cardiovascular Disease

## 2016-09-12 DIAGNOSIS — Z955 Presence of coronary angioplasty implant and graft: Secondary | ICD-10-CM

## 2016-09-14 ENCOUNTER — Encounter (HOSPITAL_COMMUNITY)
Admission: RE | Admit: 2016-09-14 | Discharge: 2016-09-14 | Disposition: A | Discharge status: Home / Self Care | Payer: 59 | Source: Ambulatory Visit | Attending: Cardiovascular Disease | Admitting: Cardiovascular Disease

## 2016-09-14 DIAGNOSIS — Z955 Presence of coronary angioplasty implant and graft: Secondary | ICD-10-CM

## 2016-09-16 ENCOUNTER — Encounter (HOSPITAL_COMMUNITY)
Admission: RE | Admit: 2016-09-16 | Discharge: 2016-09-16 | Disposition: A | Discharge status: Home / Self Care | Payer: 59 | Source: Ambulatory Visit | Attending: Cardiovascular Disease | Admitting: Cardiovascular Disease

## 2016-09-16 DIAGNOSIS — Z955 Presence of coronary angioplasty implant and graft: Secondary | ICD-10-CM

## 2016-09-19 ENCOUNTER — Encounter (HOSPITAL_COMMUNITY)
Admission: RE | Admit: 2016-09-19 | Discharge: 2016-09-19 | Disposition: A | Discharge status: Home / Self Care | Payer: 59 | Source: Ambulatory Visit | Attending: Cardiovascular Disease | Admitting: Cardiovascular Disease

## 2016-09-19 DIAGNOSIS — Z955 Presence of coronary angioplasty implant and graft: Secondary | ICD-10-CM | POA: Diagnosis not present

## 2016-09-21 ENCOUNTER — Encounter (HOSPITAL_COMMUNITY)
Admission: RE | Admit: 2016-09-21 | Discharge: 2016-09-21 | Disposition: A | Discharge status: Home / Self Care | Payer: 59 | Source: Ambulatory Visit | Attending: Cardiovascular Disease | Admitting: Cardiovascular Disease

## 2016-09-21 DIAGNOSIS — Z955 Presence of coronary angioplasty implant and graft: Secondary | ICD-10-CM | POA: Diagnosis not present

## 2016-09-23 ENCOUNTER — Telehealth (HOSPITAL_COMMUNITY): Payer: Self-pay | Admitting: Internal Medicine

## 2016-09-23 ENCOUNTER — Encounter (HOSPITAL_COMMUNITY): Payer: 59

## 2016-09-26 ENCOUNTER — Encounter (HOSPITAL_COMMUNITY)
Admission: RE | Admit: 2016-09-26 | Discharge: 2016-09-26 | Disposition: A | Discharge status: Home / Self Care | Payer: 59 | Source: Ambulatory Visit | Attending: Cardiovascular Disease | Admitting: Cardiovascular Disease

## 2016-09-26 DIAGNOSIS — Z955 Presence of coronary angioplasty implant and graft: Secondary | ICD-10-CM | POA: Diagnosis not present

## 2016-09-28 ENCOUNTER — Encounter (HOSPITAL_COMMUNITY)
Admission: RE | Admit: 2016-09-28 | Discharge: 2016-09-28 | Disposition: A | Discharge status: Home / Self Care | Payer: 59 | Source: Ambulatory Visit | Attending: Cardiovascular Disease | Admitting: Cardiovascular Disease

## 2016-09-28 DIAGNOSIS — Z955 Presence of coronary angioplasty implant and graft: Secondary | ICD-10-CM

## 2016-09-28 NOTE — Progress Notes (Signed)
Cardiac Individual Treatment Plan  Patient Details  Name: Kristina [name] MRN: [id number] Date of Birth: 02/02/1946 Referring Provider:     CARDIAC REHAB PHASE II ORIENTATION from 08/02/2016 in [place]  Referring Provider  Quay Burow, MD.      Initial Encounter Date:    CARDIAC REHAB PHASE II ORIENTATION from 08/02/2016 in [place]  Date  08/02/16  Referring Provider  Quay Burow, MD.      Visit Diagnosis: 06/23/16 Status post coronary artery stent placement  Patient's Home Medications on Admission:  Current Outpatient Prescriptions:  .  amLODipine (NORVASC) 5 MG tablet, Take 5 mg by mouth daily., Disp: , Rfl:  .  aspirin 81 MG chewable tablet, Chew 1 tablet (81 mg total) by mouth daily., Disp: 30 tablet, Rfl: 0 .  atorvastatin (LIPITOR) 80 MG tablet, Take 1 tablet (80 mg total) by mouth daily at 6 PM., Disp: 30 tablet, Rfl: 0 .  Cholecalciferol (VITAMIN D PO), Take 1 capsule by mouth daily., Disp: , Rfl:  .  furosemide (LASIX) 20 MG tablet, Take 20 mg by mouth daily. , Disp: , Rfl:  .  losartan (COZAAR) 100 MG tablet, Take 100 mg by mouth daily. , Disp: , Rfl:  .  metoprolol tartrate (LOPRESSOR) 25 MG tablet, Take 0.5 tablets (12.5 mg total) by mouth 2 (two) times daily. (Patient taking differently: Take 25 mg by mouth 2 (two) times daily. ), Disp: 60 tablet, Rfl: 0 .  nitroGLYCERIN (NITROSTAT) 0.4 MG SL tablet, Place 1 tablet (0.4 mg total) under the tongue every 5 (five) minutes as needed for chest pain., Disp: 25 tablet, Rfl: 2 .  ticagrelor (BRILINTA) 90 MG TABS tablet, Take 1 tablet (90 mg total) by mouth 2 (two) times daily., Disp: 60 tablet, Rfl: 10  Past Medical History: Past Medical History:  Diagnosis Date  . Arthritis    "right arm" (06/22/2016)  . Carpal tunnel syndrome   . Childhood asthma   . Hypertension   . Plantar fasciitis, bilateral    "had shots in them" (06/22/2016)  . Seizures  ([place])    "when I was young" (06/22/2016)  . Wears glasses     Tobacco Use: History  Smoking Status  . Never Smoker  Smokeless Tobacco  . Never Used    Labs: Recent Review Flowsheet Data    Labs for ITP Cardiac and Pulmonary Rehab Latest Ref Rng & Units 02/19/2007 01/17/2010 01/26/2010 01/26/2010 06/24/2016   Cholestrol 0 - 200 mg/dL - 189 (NOTE) ATP III Classification:      < 200        mg/dL        Desirable     200 - 239     mg/dL        Borderline High     >= 240        mg/dL        High - - 215(H)   LDLCALC 0 - 99 mg/dL - 118 (NOTE)  Total Cholesterol/HDL Ratio:CHD Risk                       Coronary Heart Disease Risk Table                                       Men       Women  1/2 Average Risk              3.4        3.3             Average Risk              5.0 4.4         2 X Average Risk              9.6        7.1         3 X Average Risk             23.4       11.0 Use the calculated Patient Ratio above and the CHD Risk table  to determine the patient's CHD Risk. ATP III Classification (LDL):      < 100 mg/dL         Optimal     100 - 129     mg/dL         Near or Above Optimal     130 - 159     mg/dL         Borderline High     160 - 189     mg/dL         High      > 190        mg/dL         Very High(H) - - 96   HDL >40 mg/dL - 41 - - 58   Trlycerides <150 mg/dL - 149 - - 306(H)   Hemoglobin A1c <5.7 % - 6.2 (NOTE)                                                                       According to the ADA Clinical Practice Recommendations for 2011, when HbA1c is used as a screening test:   >=6.5%   Diagnostic of Diabetes Mellitus           (if abnormal result is confirmed)  5.7-6.4%   Increased risk of developing Diabetes Mellitus  References:Diagnosis and Classification of Diabetes Mellitus,Diabetes VOJJ,0093,81(WEXHB 1):S62-S69 and Standards of Medical Care in         Diabetes - 2011,Diabetes Care,2011,34 (Suppl 1):S11-S61.(H) - - -   HCO3 - 32.1(H) - - - -   TCO2 0 -  100 mmol/L 34 - 31 29 -      Capillary Blood Glucose: No results found for: GLUCAP   Exercise Target Goals:    Exercise Program Goal: Individual exercise prescription set with THRR, safety & activity barriers. Participant demonstrates ability to understand and report RPE using BORG scale, to self-measure pulse accurately, and to acknowledge the importance of the exercise prescription.  Exercise Prescription Goal: Starting with aerobic activity 30 plus minutes a day, 3 days per week for initial exercise prescription. Provide home exercise prescription and guidelines that participant acknowledges understanding prior to discharge.  Activity Barriers & Risk Stratification:     Activity Barriers & Cardiac Risk Stratification - 08/02/16 0830      Activity Barriers & Cardiac Risk Stratification   Activity Barriers Back Problems   Cardiac Risk Stratification Moderate      6 Minute Walk:  6 Minute Walk    Row Name 08/02/16 1508         6 Minute Walk   Phase Initial     Distance 1609 feet     Walk Time 6 minutes     # of Rest Breaks 0     MPH 3.05     METS 3.29     RPE 11     VO2 Peak 11.53     Symptoms No     Resting HR 81 bpm     Resting BP 128/80     Max Ex. HR 107 bpm     Max Ex. BP 140/90     2 Minute Post BP 138/86        Oxygen Initial Assessment:   Oxygen Re-Evaluation:   Oxygen Discharge (Final Oxygen Re-Evaluation):   Initial Exercise Prescription:     Initial Exercise Prescription - 08/02/16 1500      Date of Initial Exercise RX and Referring Provider   Date 08/02/16   Referring Provider Quay Burow, MD.     Treadmill   MPH 2.3   Grade 1   Minutes 10   METs 3.08     Bike   Level 1.2   Minutes 10   METs 3.32     NuStep   Level 3   SPM 85   Minutes 10   METs 2.8     Prescription Details   Frequency (times per week) 3   Duration Progress to 30 minutes of continuous aerobic without signs/symptoms of physical distress      Intensity   THRR 40-80% of Max Heartrate 61-122   Ratings of Perceived Exertion 11-13   Perceived Dyspnea 0-4     Progression   Progression Continue to progress workloads to maintain intensity without signs/symptoms of physical distress.     Resistance Training   Training Prescription Yes   Weight 3lbs.   Reps 10-15      Perform Capillary Blood Glucose checks as needed.  Exercise Prescription Changes:     Exercise Prescription Changes    Row Name 08/18/16 1600 09/28/16 1400           Response to Exercise   Blood Pressure (Admit) 120/86 120/80      Blood Pressure (Exercise) 122/76 122/74      Blood Pressure (Exit) 138/74 122/68      Heart Rate (Admit) 81 bpm 89 bpm      Heart Rate (Exercise) 121 bpm 119 bpm      Heart Rate (Exit) 81 bpm 77 bpm      Rating of Perceived Exertion (Exercise) 12 9      Duration Progress to 45 minutes of aerobic exercise without signs/symptoms of physical distress Progress to 45 minutes of aerobic exercise without signs/symptoms of physical distress      Intensity THRR unchanged THRR unchanged        Progression   Progression Continue to progress workloads to maintain intensity without signs/symptoms of physical distress. Continue to progress workloads to maintain intensity without signs/symptoms of physical distress.      Average METs 2.7 2.9        Resistance Training   Training Prescription Yes Yes      Weight 2lb 2lb      Reps 10-15 10-15        Treadmill   MPH 2.3 2.3      Grade 1 1      Minutes 10 10  METs 3.08 3.32        Bike   Level 1.2 1.2      Minutes 10 10      METs 3.32 3.32        NuStep   Level 3 4      SPM 85 85      Minutes 10 10      METs 1.9 2.2        Home Exercise Plan   Plans to continue exercise at  - Home (comment)      Frequency  - Add 3 additional days to program exercise sessions.         Exercise Comments:     Exercise Comments    Row Name 08/25/16 1556 09/28/16 1418          Exercise Comments Reviewed goals with pt.  Pt is doing well with exercise.  Reviewed METs and goals with pt.         Exercise Goals and Review:     Exercise Goals    Row Name 08/02/16 0831             Exercise Goals   Increase Physical Activity Yes       Intervention Provide advice, education, support and counseling about physical activity/exercise needs.;Develop an individualized exercise prescription for aerobic and resistive training based on initial evaluation findings, risk stratification, comorbidities and participant's personal goals.       Expected Outcomes Achievement of increased cardiorespiratory fitness and enhanced flexibility, muscular endurance and strength shown through measurements of functional capacity and personal statement of participant.       Increase Strength and Stamina Yes       Intervention Provide advice, education, support and counseling about physical activity/exercise needs.;Develop an individualized exercise prescription for aerobic and resistive training based on initial evaluation findings, risk stratification, comorbidities and participant's personal goals.       Expected Outcomes Achievement of increased cardiorespiratory fitness and enhanced flexibility, muscular endurance and strength shown through measurements of functional capacity and personal statement of participant.          Exercise Goals Re-Evaluation :     Exercise Goals Re-Evaluation    [place] Name 08/25/16 1555 09/28/16 1417           Exercise Goal Re-Evaluation   Exercise Goals Review Increase Physical Activity;Increase Strenth and Stamina Increase Physical Activity;Increase Strenth and Stamina      Comments Pt states she hasn't lost any weight, and she's not gaining, but she states she notices that her body feels different and she feels great Pt continues to do well with exercise and she has lost almost 5lbs      Expected Outcomes continue with exercise Rx, attend education classes  and gradually increase workloads in order to promote weight loss and increase cardiorespiratory fitness level continue with exercise Rx, attend education classes and gradually increase workloads in order to promote weight loss and increase cardiorespiratory fitness level          Discharge Exercise Prescription (Final Exercise Prescription Changes):     Exercise Prescription Changes - 09/28/16 1400      Response to Exercise   Blood Pressure (Admit) 120/80   Blood Pressure (Exercise) 122/74   Blood Pressure (Exit) 122/68   Heart Rate (Admit) 89 bpm   Heart Rate (Exercise) 119 bpm   Heart Rate (Exit) 77 bpm   Rating of Perceived Exertion (Exercise) 9   Duration Progress to 45 minutes of aerobic exercise  without signs/symptoms of physical distress   Intensity THRR unchanged     Progression   Progression Continue to progress workloads to maintain intensity without signs/symptoms of physical distress.   Average METs 2.9     Resistance Training   Training Prescription Yes   Weight 2lb   Reps 10-15     Treadmill   MPH 2.3   Grade 1   Minutes 10   METs 3.32     Bike   Level 1.2   Minutes 10   METs 3.32     NuStep   Level 4   SPM 85   Minutes 10   METs 2.2     Home Exercise Plan   Plans to continue exercise at Home (comment)   Frequency Add 3 additional days to program exercise sessions.      Nutrition:  Target Goals: Understanding of nutrition guidelines, daily intake of sodium 1500mg , cholesterol 200mg , calories 30% from fat and 7% or less from saturated fats, daily to have 5 or more servings of fruits and vegetables.  Biometrics:     Pre Biometrics - 08/02/16 1511      Pre Biometrics   Height 5' 6.5" (1.689 m)   Weight 215 lb 6.2 oz (97.7 kg)   Waist Circumference 42.5 inches   Hip Circumference 49.5 inches   Waist to Hip Ratio 0.86 %   BMI (Calculated) 34.3   Triceps Skinfold 28 mm   % Body Fat 44.6 %   Grip Strength 35 kg   Flexibility 8 in    Single Leg Stand 15.93 seconds       Nutrition Therapy Plan and Nutrition Goals:     Nutrition Therapy & Goals - 08/23/16 1232      Nutrition Therapy   Diet Therapeutic Lifestyle Changes     Personal Nutrition Goals   Nutrition Goal Pt to identify and limit food sources of saturated fat, trans fat, and sodium.    Personal Goal #2 Pt to lose 1-2 lb/week to a wt loss goal of 6-24 lb at graduation from rehab     [place], educate and counsel regarding individualized specific dietary modifications aiming towards targeted core components such as weight, hypertension, lipid management, diabetes, heart failure and other comorbidities.   Expected Outcomes Short Term Goal: Understand basic principles of dietary content, such as calories, fat, sodium, cholesterol and nutrients.;Long Term Goal: Adherence to prescribed nutrition plan.      Nutrition Discharge: Nutrition Scores:     Nutrition Assessments - 08/23/16 1230      MEDFICTS Scores   Pre Score 44      Nutrition Goals Re-Evaluation:   Nutrition Goals Re-Evaluation:   Nutrition Goals Discharge (Final Nutrition Goals Re-Evaluation):   Psychosocial: Target Goals: Acknowledge presence or absence of significant depression and/or stress, maximize coping skills, provide positive support system. Participant is able to verbalize types and ability to use techniques and skills needed for reducing stress and depression.  Initial Review & Psychosocial Screening:     Initial Psych Review & Screening - 08/02/16 [place]? Yes     Barriers   Psychosocial barriers to participate in program There are no identifiable barriers or psychosocial needs.     Screening Interventions   Interventions Encouraged to exercise      Quality of Life Scores:     Quality of Life - 08/02/16 6294  Quality of Life Scores   Health/Function Pre 22.8 %   Socioeconomic  Pre 29.14 %   Psych/Spiritual Pre 28.29 %   Family Pre 20.4 %   GLOBAL Pre 24.88 %      PHQ-9: Recent Review Flowsheet Data    There is no flowsheet data to display.     Interpretation of Total Score  Total Score Depression Severity:  1-4 = Minimal depression, 5-9 = Mild depression, 10-14 = Moderate depression, 15-19 = Moderately severe depression, 20-27 = Severe depression   Psychosocial Evaluation and Intervention:     Psychosocial Evaluation - 08/08/16 1647      Psychosocial Evaluation & Interventions   Interventions Encouraged to exercise with the program and follow exercise prescription   Comments no psychosocial needs identified, no internvetions necessary.    Expected Outcomes pt will exhibit positive outlook with good coping skills.    Continue Psychosocial Services  No Follow up required      Psychosocial Re-Evaluation:     Psychosocial Re-Evaluation    [place] Name 08/31/16 1742 09/28/16 1721           Psychosocial Re-Evaluation   Current issues with None Identified None Identified      Interventions Encouraged to attend Cardiac Rehabilitation for the exercise Encouraged to attend Cardiac Rehabilitation for the exercise      Continue Psychosocial Services  No Follow up required No Follow up required         Psychosocial Discharge (Final Psychosocial Re-Evaluation):     Psychosocial Re-Evaluation - 09/28/16 1721      Psychosocial Re-Evaluation   Current issues with None Identified   Interventions Encouraged to attend Cardiac Rehabilitation for the exercise   Continue Psychosocial Services  No Follow up required      Vocational Rehabilitation: Provide vocational rehab assistance to qualifying candidates.   Vocational Rehab Evaluation & Intervention:     Vocational Rehab - 08/02/16 1722      Initial Vocational Rehab Evaluation & Intervention   Assessment shows need for Vocational Rehabilitation Yes      Education: Education Goals: Education  classes will be provided on a weekly basis, covering required topics. Participant will state understanding/return demonstration of topics presented.  Learning Barriers/Preferences:     Learning Barriers/Preferences - 08/02/16 0830      Learning Barriers/Preferences   Learning Barriers Sight   Learning Preferences Verbal Instruction      Education Topics: Count Your Pulse:  -Group instruction provided by verbal instruction, demonstration, patient participation and written materials to support subject.  Instructors address importance of being able to find your pulse and how to count your pulse when at home without a heart monitor.  Patients get hands on experience counting their pulse with staff help and individually.   CARDIAC REHAB PHASE II EXERCISE from 09/28/2016 in [place]  Date  08/12/16  Instruction Review Code  2- meets goals/outcomes      Heart Attack, Angina, and Risk Factor Modification:  -Group instruction provided by verbal instruction, video, and written materials to support subject.  Instructors address signs and symptoms of angina and heart attacks.    Also discuss risk factors for heart disease and how to make changes to improve heart health risk factors.   Functional Fitness:  -Group instruction provided by verbal instruction, demonstration, patient participation, and written materials to support subject.  Instructors address safety measures for doing things around the house.  Discuss how to get up and down off  the floor, how to pick things up properly, how to safely get out of a chair without assistance, and balance training.   CARDIAC REHAB PHASE II EXERCISE from 09/28/2016 in [place]  Date  08/26/16  Instruction Review Code  2- meets goals/outcomes      Meditation and Mindfulness:  -Group instruction provided by verbal instruction, patient participation, and written materials to support subject.   Instructor addresses importance of mindfulness and meditation practice to help reduce stress and improve awareness.  Instructor also leads participants through a meditation exercise.    CARDIAC REHAB PHASE II EXERCISE from 09/28/2016 in [place]  Date  09/14/16  Instruction Review Code  2- meets goals/outcomes      Stretching for Flexibility and Mobility:  -Group instruction provided by verbal instruction, patient participation, and written materials to support subject.  Instructors lead participants through series of stretches that are designed to increase flexibility thus improving mobility.  These stretches are additional exercise for major muscle groups that are typically performed during regular warm up and cool down.   Hands Only CPR:  -Group verbal, video, and participation provides a basic overview of AHA guidelines for community CPR. Role-play of emergencies allow participants the opportunity to practice calling for help and chest compression technique with discussion of AED use.   Hypertension: -Group verbal and written instruction that provides a basic overview of hypertension including the most recent diagnostic guidelines, risk factor reduction with self-care instructions and medication management.    Nutrition I class: Heart Healthy Eating:  -Group instruction provided by PowerPoint slides, verbal discussion, and written materials to support subject matter. The instructor gives an explanation and review of the Therapeutic Lifestyle Changes diet recommendations, which includes a discussion on lipid goals, dietary fat, sodium, fiber, plant stanol/sterol esters, sugar, and the components of a well-balanced, healthy diet.   Nutrition II class: Lifestyle Skills:  -Group instruction provided by PowerPoint slides, verbal discussion, and written materials to support subject matter. The instructor gives an explanation and review of label reading, grocery  shopping for heart health, heart healthy recipe modifications, and ways to make healthier choices when eating out.   Diabetes Question & Answer:  -Group instruction provided by PowerPoint slides, verbal discussion, and written materials to support subject matter. The instructor gives an explanation and review of diabetes co-morbidities, pre- and post-prandial blood glucose goals, pre-exercise blood glucose goals, signs, symptoms, and treatment of hypoglycemia and hyperglycemia, and foot care basics.   Diabetes Blitz:  -Group instruction provided by PowerPoint slides, verbal discussion, and written materials to support subject matter. The instructor gives an explanation and review of the physiology behind type 1 and type 2 diabetes, diabetes medications and rational behind using different medications, pre- and post-prandial blood glucose recommendations and Hemoglobin A1c goals, diabetes diet, and exercise including blood glucose guidelines for exercising safely.    Portion Distortion:  -Group instruction provided by PowerPoint slides, verbal discussion, written materials, and food models to support subject matter. The instructor gives an explanation of serving size versus portion size, changes in portions sizes over the last 20 years, and what consists of a serving from each food group.   CARDIAC REHAB PHASE II EXERCISE from 09/28/2016 in [place]  Date  09/28/16  Educator  RD  Instruction Review Code  2- meets goals/outcomes      Stress Management:  -Group instruction provided by verbal instruction, video, and written materials to  support subject matter.  Instructors review role of stress in heart disease and how to cope with stress positively.     CARDIAC REHAB PHASE II EXERCISE from 09/28/2016 in [place]  Date  08/10/16  Instruction Review Code  2- meets goals/outcomes      Exercising on Your Own:  -Group instruction  provided by verbal instruction, power point, and written materials to support subject.  Instructors discuss benefits of exercise, components of exercise, frequency and intensity of exercise, and end points for exercise.  Also discuss use of nitroglycerin and activating EMS.  Review options of places to exercise outside of rehab.  Review guidelines for sex with heart disease.   CARDIAC REHAB PHASE II EXERCISE from 09/28/2016 in [place]  Date  08/24/16  Instruction Review Code  2- meets goals/outcomes      Cardiac Drugs I:  -Group instruction provided by verbal instruction and written materials to support subject.  Instructor reviews cardiac drug classes: antiplatelets, anticoagulants, beta blockers, and statins.  Instructor discusses reasons, side effects, and lifestyle considerations for each drug class.   Cardiac Drugs II:  -Group instruction provided by verbal instruction and written materials to support subject.  Instructor reviews cardiac drug classes: angiotensin converting enzyme inhibitors (ACE-I), angiotensin II receptor blockers (ARBs), nitrates, and calcium channel blockers.  Instructor discusses reasons, side effects, and lifestyle considerations for each drug class.   CARDIAC REHAB PHASE II EXERCISE from 09/28/2016 in [place]  Date  09/21/16  Instruction Review Code  2- meets goals/outcomes      Anatomy and Physiology of the Circulatory System:  Group verbal and written instruction and models provide basic cardiac anatomy and physiology, with the coronary electrical and arterial systems. Review of: AMI, Angina, Valve disease, Heart Failure, Peripheral Artery Disease, Cardiac Arrhythmia, Pacemakers, and the ICD.   CARDIAC REHAB PHASE II EXERCISE from 09/28/2016 in [place]  Date  09/07/16  Instruction Review Code  2- meets goals/outcomes      Other Education:  -Group or  individual verbal, written, or video instructions that support the educational goals of the cardiac rehab program.   Knowledge Questionnaire Score:     Knowledge Questionnaire Score - 08/02/16 0956      Knowledge Questionnaire Score   Pre Score 15/24      Core Components/Risk Factors/Patient Goals at Admission:     Personal Goals and Risk Factors at Admission - 08/02/16 1730      Core Components/Risk Factors/Patient Goals on Admission    Weight Management Yes;Obesity   Intervention Weight Management: Develop a combined nutrition and exercise program designed to reach desired caloric intake, while maintaining appropriate intake of nutrient and fiber, sodium and fats, and appropriate energy expenditure required for the weight goal.;Weight Management: Provide education and appropriate resources to help participant work on and attain dietary goals.;Weight Management/Obesity: Establish reasonable short term and long term weight goals.;Obesity: Provide education and appropriate resources to help participant work on and attain dietary goals.   Admit Weight 215 lb 6.2 oz (97.7 kg)   Goal Weight: Long Term 191 lb (86.6 kg)   Expected Outcomes Short Term: Continue to assess and modify interventions until short term weight is achieved;Long Term: Adherence to nutrition and physical activity/exercise program aimed toward attainment of established weight goal;Weight Loss: Understanding of general recommendations for a balanced deficit meal plan, which promotes 1-2 lb weight loss per week and includes  a negative energy balance of [phone] kcal/d   Hypertension Yes   Intervention Provide education on lifestyle modifcations including regular physical activity/exercise, weight management, moderate sodium restriction and increased consumption of fresh fruit, vegetables, and low fat dairy, alcohol moderation, and smoking cessation.;Monitor prescription use compliance.   Expected Outcomes Short Term: Continued  assessment and intervention until BP is < 140/33mm HG in hypertensive participants. < 130/33mm HG in hypertensive participants with diabetes, heart failure or chronic kidney disease.;Long Term: Maintenance of blood pressure at goal levels.   Lipids Yes   Intervention Provide education and support for participant on nutrition & aerobic/resistive exercise along with prescribed medications to achieve LDL 70mg , HDL >40mg .   Expected Outcomes Short Term: Participant states understanding of desired cholesterol values and is compliant with medications prescribed. Participant is following exercise prescription and nutrition guidelines.;Long Term: Cholesterol controlled with medications as prescribed, with individualized exercise RX and with personalized nutrition plan. Value goals: LDL < 70mg , HDL > 40 mg.      Core Components/Risk Factors/Patient Goals Review:    Core Components/Risk Factors/Patient Goals at Discharge (Final Review):    ITP Comments:     ITP Comments    Row Name 08/02/16 0827           ITP Comments Medical Director, Dr. Fransico Him          Comments: Emmani is making expected progress toward personal goals after completing 16 sessions. Recommend continued exercise and life style modification education including  stress management and relaxation techniques to decrease cardiac risk profile. Tasheena continues to do well with exercise.Barnet Pall, RN,BSN 09/28/2016 5:23 PM

## 2016-09-30 ENCOUNTER — Encounter (HOSPITAL_COMMUNITY)
Admission: RE | Admit: 2016-09-30 | Discharge: 2016-09-30 | Disposition: A | Discharge status: Home / Self Care | Payer: 59 | Source: Ambulatory Visit | Attending: Cardiovascular Disease | Admitting: Cardiovascular Disease

## 2016-09-30 DIAGNOSIS — Z955 Presence of coronary angioplasty implant and graft: Secondary | ICD-10-CM | POA: Diagnosis not present

## 2016-10-03 ENCOUNTER — Encounter (HOSPITAL_COMMUNITY)
Admission: RE | Admit: 2016-10-03 | Discharge: 2016-10-03 | Disposition: A | Discharge status: Home / Self Care | Payer: 59 | Source: Ambulatory Visit | Attending: Cardiovascular Disease | Admitting: Cardiovascular Disease

## 2016-10-03 DIAGNOSIS — Z955 Presence of coronary angioplasty implant and graft: Secondary | ICD-10-CM

## 2016-10-05 ENCOUNTER — Encounter (HOSPITAL_COMMUNITY)
Admission: RE | Admit: 2016-10-05 | Discharge: 2016-10-05 | Disposition: A | Discharge status: Home / Self Care | Payer: 59 | Source: Ambulatory Visit | Attending: Cardiovascular Disease | Admitting: Cardiovascular Disease

## 2016-10-05 DIAGNOSIS — Z955 Presence of coronary angioplasty implant and graft: Secondary | ICD-10-CM

## 2016-10-07 ENCOUNTER — Encounter (HOSPITAL_COMMUNITY): Payer: 59

## 2016-10-10 ENCOUNTER — Encounter (HOSPITAL_COMMUNITY)
Admission: RE | Admit: 2016-10-10 | Discharge: 2016-10-10 | Disposition: A | Discharge status: Home / Self Care | Payer: 59 | Source: Ambulatory Visit | Attending: Cardiovascular Disease | Admitting: Cardiovascular Disease

## 2016-10-10 DIAGNOSIS — Z955 Presence of coronary angioplasty implant and graft: Secondary | ICD-10-CM | POA: Diagnosis not present

## 2016-10-10 DIAGNOSIS — M1990 Unspecified osteoarthritis, unspecified site: Secondary | ICD-10-CM | POA: Diagnosis not present

## 2016-10-10 DIAGNOSIS — I10 Essential (primary) hypertension: Secondary | ICD-10-CM | POA: Diagnosis not present

## 2016-10-10 DIAGNOSIS — Z7902 Long term (current) use of antithrombotics/antiplatelets: Secondary | ICD-10-CM | POA: Insufficient documentation

## 2016-10-10 DIAGNOSIS — Z7982 Long term (current) use of aspirin: Secondary | ICD-10-CM | POA: Insufficient documentation

## 2016-10-10 DIAGNOSIS — Z79899 Other long term (current) drug therapy: Secondary | ICD-10-CM | POA: Insufficient documentation

## 2016-10-14 ENCOUNTER — Encounter (HOSPITAL_COMMUNITY)
Admission: RE | Admit: 2016-10-14 | Discharge: 2016-10-14 | Disposition: A | Discharge status: Home / Self Care | Payer: 59 | Source: Ambulatory Visit | Attending: Cardiovascular Disease | Admitting: Cardiovascular Disease

## 2016-10-14 DIAGNOSIS — Z955 Presence of coronary angioplasty implant and graft: Secondary | ICD-10-CM

## 2016-10-17 ENCOUNTER — Encounter (HOSPITAL_COMMUNITY)
Admission: RE | Admit: 2016-10-17 | Discharge: 2016-10-17 | Disposition: A | Discharge status: Home / Self Care | Payer: 59 | Source: Ambulatory Visit | Attending: Cardiovascular Disease | Admitting: Cardiovascular Disease

## 2016-10-17 DIAGNOSIS — Z955 Presence of coronary angioplasty implant and graft: Secondary | ICD-10-CM | POA: Diagnosis not present

## 2016-10-19 ENCOUNTER — Encounter (HOSPITAL_COMMUNITY)
Admission: RE | Admit: 2016-10-19 | Discharge: 2016-10-19 | Disposition: A | Discharge status: Home / Self Care | Payer: 59 | Source: Ambulatory Visit | Attending: Cardiovascular Disease | Admitting: Cardiovascular Disease

## 2016-10-19 DIAGNOSIS — Z955 Presence of coronary angioplasty implant and graft: Secondary | ICD-10-CM | POA: Diagnosis not present

## 2016-10-21 ENCOUNTER — Encounter (HOSPITAL_COMMUNITY)
Admission: RE | Admit: 2016-10-21 | Discharge: 2016-10-21 | Disposition: A | Discharge status: Home / Self Care | Payer: 59 | Source: Ambulatory Visit | Attending: Cardiovascular Disease | Admitting: Cardiovascular Disease

## 2016-10-21 DIAGNOSIS — Z955 Presence of coronary angioplasty implant and graft: Secondary | ICD-10-CM

## 2016-10-24 ENCOUNTER — Encounter (HOSPITAL_COMMUNITY)
Admission: RE | Admit: 2016-10-24 | Discharge: 2016-10-24 | Disposition: A | Discharge status: Home / Self Care | Payer: 59 | Source: Ambulatory Visit | Attending: Cardiovascular Disease | Admitting: Cardiovascular Disease

## 2016-10-24 DIAGNOSIS — Z955 Presence of coronary angioplasty implant and graft: Secondary | ICD-10-CM

## 2016-10-25 NOTE — Progress Notes (Signed)
Cardiac Individual Treatment Plan  Patient Details  Name: Kristina [name] MRN: [id number] Date of Birth: 03/05/1946 Referring Provider:     CARDIAC REHAB PHASE II ORIENTATION from 08/02/2016 in [place]  Referring Provider  Quay Burow, MD.      Initial Encounter Date:    CARDIAC REHAB PHASE II ORIENTATION from 08/02/2016 in [facility]  Date  08/02/16  Referring Provider  Quay Burow, MD.      Visit Diagnosis: 06/23/16 Status post coronary artery stent placement  Patient's Home Medications on Admission:  Current Outpatient Prescriptions:  .  amLODipine (NORVASC) 5 MG tablet, Take 5 mg by mouth daily., Disp: , Rfl:  .  aspirin 81 MG chewable tablet, Chew 1 tablet (81 mg total) by mouth daily., Disp: 30 tablet, Rfl: 0 .  atorvastatin (LIPITOR) 80 MG tablet, Take 1 tablet (80 mg total) by mouth daily at 6 PM., Disp: 30 tablet, Rfl: 0 .  Cholecalciferol (VITAMIN D PO), Take 1 capsule by mouth daily., Disp: , Rfl:  .  furosemide (LASIX) 20 MG tablet, Take 20 mg by mouth daily. , Disp: , Rfl:  .  losartan (COZAAR) 100 MG tablet, Take 100 mg by mouth daily. , Disp: , Rfl:  .  metoprolol tartrate (LOPRESSOR) 25 MG tablet, Take 0.5 tablets (12.5 mg total) by mouth 2 (two) times daily. (Patient taking differently: Take 25 mg by mouth 2 (two) times daily. ), Disp: 60 tablet, Rfl: 0 .  nitroGLYCERIN (NITROSTAT) 0.4 MG SL tablet, Place 1 tablet (0.4 mg total) under the tongue every 5 (five) minutes as needed for chest pain., Disp: 25 tablet, Rfl: 2 .  ticagrelor (BRILINTA) 90 MG TABS tablet, Take 1 tablet (90 mg total) by mouth 2 (two) times daily., Disp: 60 tablet, Rfl: 10  Past Medical History: Past Medical History:  Diagnosis Date  . Arthritis    "right arm" (06/22/2016)  . Carpal tunnel syndrome   . Childhood asthma   . Hypertension   . Plantar fasciitis, bilateral    "had shots in them" (06/22/2016)  . Seizures  ([place])    "when I was young" (06/22/2016)  . Wears glasses     Tobacco Use: History  Smoking Status  . Never Smoker  Smokeless Tobacco  . Never Used    Labs: Recent Review Flowsheet Data    Labs for ITP Cardiac and Pulmonary Rehab Latest Ref Rng & Units 02/19/2007 01/17/2010 01/26/2010 01/26/2010 06/24/2016   Cholestrol 0 - 200 mg/dL - 189 (NOTE) ATP III Classification:      < 200        mg/dL        Desirable     200 - 239     mg/dL        Borderline High     >= 240        mg/dL        High - - 215(H)   LDLCALC 0 - 99 mg/dL - 118 (NOTE)  Total Cholesterol/HDL Ratio:CHD Risk                       Coronary Heart Disease Risk Table                                       Men       Women  1/2 Average Risk              3.4        3.3             Average Risk              5.0 4.4         2 X Average Risk              9.6        7.1         3 X Average Risk             23.4       11.0 Use the calculated Patient Ratio above and the CHD Risk table  to determine the patient's CHD Risk. ATP III Classification (LDL):      < 100 mg/dL         Optimal     100 - 129     mg/dL         Near or Above Optimal     130 - 159     mg/dL         Borderline High     160 - 189     mg/dL         High      > 190        mg/dL         Very High(H) - - 96   HDL >40 mg/dL - 41 - - 58   Trlycerides <150 mg/dL - 149 - - 306(H)   Hemoglobin A1c <5.7 % - 6.2 (NOTE)                                                                       According to the ADA Clinical Practice Recommendations for 2011, when HbA1c is used as a screening test:   >=6.5%   Diagnostic of Diabetes Mellitus           (if abnormal result is confirmed)  5.7-6.4%   Increased risk of developing Diabetes Mellitus  References:Diagnosis and Classification of Diabetes Mellitus,Diabetes SWFU,9323,55(DDUKG 1):S62-S69 and Standards of Medical Care in         Diabetes - 2011,Diabetes Care,2011,34 (Suppl 1):S11-S61.(H) - - -   HCO3 - 32.1(H) - - - -   TCO2 0 -  100 mmol/L 34 - 31 29 -      Capillary Blood Glucose: No results found for: GLUCAP   Exercise Target Goals:    Exercise Program Goal: Individual exercise prescription set with THRR, safety & activity barriers. Participant demonstrates ability to understand and report RPE using BORG scale, to self-measure pulse accurately, and to acknowledge the importance of the exercise prescription.  Exercise Prescription Goal: Starting with aerobic activity 30 plus minutes a day, 3 days per week for initial exercise prescription. Provide home exercise prescription and guidelines that participant acknowledges understanding prior to discharge.  Activity Barriers & Risk Stratification:     Activity Barriers & Cardiac Risk Stratification - 08/02/16 0830      Activity Barriers & Cardiac Risk Stratification   Activity Barriers Back Problems   Cardiac Risk Stratification Moderate      6 Minute Walk:  6 Minute Walk    Row Name 08/02/16 1508         6 Minute Walk   Phase Initial     Distance 1609 feet     Walk Time 6 minutes     # of Rest Breaks 0     MPH 3.05     METS 3.29     RPE 11     VO2 Peak 11.53     Symptoms No     Resting HR 81 bpm     Resting BP 128/80     Max Ex. HR 107 bpm     Max Ex. BP 140/90     2 Minute Post BP 138/86        Oxygen Initial Assessment:   Oxygen Re-Evaluation:   Oxygen Discharge (Final Oxygen Re-Evaluation):   Initial Exercise Prescription:     Initial Exercise Prescription - 08/02/16 1500      Date of Initial Exercise RX and Referring Provider   Date 08/02/16   Referring Provider Quay Burow, MD.     Treadmill   MPH 2.3   Grade 1   Minutes 10   METs 3.08     Bike   Level 1.2   Minutes 10   METs 3.32     NuStep   Level 3   SPM 85   Minutes 10   METs 2.8     Prescription Details   Frequency (times per week) 3   Duration Progress to 30 minutes of continuous aerobic without signs/symptoms of physical distress      Intensity   THRR 40-80% of Max Heartrate 61-122   Ratings of Perceived Exertion 11-13   Perceived Dyspnea 0-4     Progression   Progression Continue to progress workloads to maintain intensity without signs/symptoms of physical distress.     Resistance Training   Training Prescription Yes   Weight 3lbs.   Reps 10-15      Perform Capillary Blood Glucose checks as needed.  Exercise Prescription Changes:     Exercise Prescription Changes    Row Name 08/18/16 1600 09/28/16 1400 10/24/16 1100         Response to Exercise   Blood Pressure (Admit) 120/86 120/80 120/80     Blood Pressure (Exercise) 122/76 122/74 140/80     Blood Pressure (Exit) 138/74 122/68 138/84     Heart Rate (Admit) 81 bpm 89 bpm 76 bpm     Heart Rate (Exercise) 121 bpm 119 bpm 112 bpm     Heart Rate (Exit) 81 bpm 77 bpm 80 bpm     Rating of Perceived Exertion (Exercise) 12 9 12      Duration Progress to 45 minutes of aerobic exercise without signs/symptoms of physical distress Progress to 45 minutes of aerobic exercise without signs/symptoms of physical distress Progress to 45 minutes of aerobic exercise without signs/symptoms of physical distress     Intensity THRR unchanged THRR unchanged THRR unchanged       Progression   Progression Continue to progress workloads to maintain intensity without signs/symptoms of physical distress. Continue to progress workloads to maintain intensity without signs/symptoms of physical distress. Continue to progress workloads to maintain intensity without signs/symptoms of physical distress.     Average METs 2.7 2.9 3.1       Resistance Training   Training Prescription Yes Yes Yes     Weight 2lb 2lb 3lb     Reps 10-15 10-15 10-15  Treadmill   MPH 2.3 2.3 2.3     Grade 1 1 1      Minutes 10 10 10      METs 3.08 3.32 3.32       Bike   Level 1.2 1.2 1.2     Minutes 10 10 10      METs 3.32 3.32 3.32       NuStep   Level 3 4 5      SPM 85 85 85     Minutes 10 10 10       METs 1.9 2.2 3.1       Home Exercise Plan   Plans to continue exercise at  - Home (comment) Home (comment)     Frequency  - Add 3 additional days to program exercise sessions. Add 3 additional days to program exercise sessions.        Exercise Comments:     Exercise Comments    Row Name 08/25/16 1556 09/28/16 1418 10/24/16 1152       Exercise Comments Reviewed goals with pt.  Pt is doing well with exercise.  Reviewed METs and goals with pt. reviewed goals with pt.         Exercise Goals and Review:     Exercise Goals    Row Name 08/02/16 0831             Exercise Goals   Increase Physical Activity Yes       Intervention Provide advice, education, support and counseling about physical activity/exercise needs.;Develop an individualized exercise prescription for aerobic and resistive training based on initial evaluation findings, risk stratification, comorbidities and participant's personal goals.       Expected Outcomes Achievement of increased cardiorespiratory fitness and enhanced flexibility, muscular endurance and strength shown through measurements of functional capacity and personal statement of participant.       Increase Strength and Stamina Yes       Intervention Provide advice, education, support and counseling about physical activity/exercise needs.;Develop an individualized exercise prescription for aerobic and resistive training based on initial evaluation findings, risk stratification, comorbidities and participant's personal goals.       Expected Outcomes Achievement of increased cardiorespiratory fitness and enhanced flexibility, muscular endurance and strength shown through measurements of functional capacity and personal statement of participant.          Exercise Goals Re-Evaluation :     Exercise Goals Re-Evaluation    [place] Name 08/25/16 1555 09/28/16 1417 10/24/16 1151         Exercise Goal Re-Evaluation   Exercise Goals Review Increase Physical  Activity;Increase Strenth and Stamina Increase Physical Activity;Increase Strenth and Stamina Increase Physical Activity;Increase Strenth and Stamina     Comments Pt states she hasn't lost any weight, and she's not gaining, but she states she notices that her body feels different and she feels great Pt continues to do well with exercise and she has lost almost 5lbs Pt continues to do well with exercise and she's tolerating workload increases well     Expected Outcomes continue with exercise Rx, attend education classes and gradually increase workloads in order to promote weight loss and increase cardiorespiratory fitness level continue with exercise Rx, attend education classes and gradually increase workloads in order to promote weight loss and increase cardiorespiratory fitness level continue with exercise Rx, attend education classes and gradually increase workloads in order to promote weight loss and increase cardiorespiratory fitness level         Discharge Exercise Prescription (Final Exercise  Prescription Changes):     Exercise Prescription Changes - 10/24/16 1100      Response to Exercise   Blood Pressure (Admit) 120/80   Blood Pressure (Exercise) 140/80   Blood Pressure (Exit) 138/84   Heart Rate (Admit) 76 bpm   Heart Rate (Exercise) 112 bpm   Heart Rate (Exit) 80 bpm   Rating of Perceived Exertion (Exercise) 12   Duration Progress to 45 minutes of aerobic exercise without signs/symptoms of physical distress   Intensity THRR unchanged     Progression   Progression Continue to progress workloads to maintain intensity without signs/symptoms of physical distress.   Average METs 3.1     Resistance Training   Training Prescription Yes   Weight 3lb   Reps 10-15     Treadmill   MPH 2.3   Grade 1   Minutes 10   METs 3.32     Bike   Level 1.2   Minutes 10   METs 3.32     NuStep   Level 5   SPM 85   Minutes 10   METs 3.1     Home Exercise Plan   Plans to continue  exercise at Home (comment)   Frequency Add 3 additional days to program exercise sessions.      Nutrition:  Target Goals: Understanding of nutrition guidelines, daily intake of sodium 1500mg , cholesterol 200mg , calories 30% from fat and 7% or less from saturated fats, daily to have 5 or more servings of fruits and vegetables.  Biometrics:     Pre Biometrics - 08/02/16 1511      Pre Biometrics   Height 5' 6.5" (1.689 m)   Weight 215 lb 6.2 oz (97.7 kg)   Waist Circumference 42.5 inches   Hip Circumference 49.5 inches   Waist to Hip Ratio 0.86 %   BMI (Calculated) 34.3   Triceps Skinfold 28 mm   % Body Fat 44.6 %   Grip Strength 35 kg   Flexibility 8 in   Single Leg Stand 15.93 seconds       Nutrition Therapy Plan and Nutrition Goals:     Nutrition Therapy & Goals - 08/23/16 1232      Nutrition Therapy   Diet Therapeutic Lifestyle Changes     Personal Nutrition Goals   Nutrition Goal Pt to identify and limit food sources of saturated fat, trans fat, and sodium.    Personal Goal #2 Pt to lose 1-2 lb/week to a wt loss goal of 6-24 lb at graduation from rehab     [place], educate and counsel regarding individualized specific dietary modifications aiming towards targeted core components such as weight, hypertension, lipid management, diabetes, heart failure and other comorbidities.   Expected Outcomes Short Term Goal: Understand basic principles of dietary content, such as calories, fat, sodium, cholesterol and nutrients.;Long Term Goal: Adherence to prescribed nutrition plan.      Nutrition Discharge: Nutrition Scores:     Nutrition Assessments - 08/23/16 1230      MEDFICTS Scores   Pre Score 44      Nutrition Goals Re-Evaluation:   Nutrition Goals Re-Evaluation:   Nutrition Goals Discharge (Final Nutrition Goals Re-Evaluation):   Psychosocial: Target Goals: Acknowledge presence or absence of significant depression  and/or stress, maximize coping skills, provide positive support system. Participant is able to verbalize types and ability to use techniques and skills needed for reducing stress and depression.  Initial Review & Psychosocial Screening:  Initial Psych Review & Screening - 08/02/16 [place]? Yes     Barriers   Psychosocial barriers to participate in program There are no identifiable barriers or psychosocial needs.     Screening Interventions   Interventions Encouraged to exercise      Quality of Life Scores:     Quality of Life - 08/02/16 0955      Quality of Life Scores   Health/Function Pre 22.8 %   Socioeconomic Pre 29.14 %   Psych/Spiritual Pre 28.29 %   Family Pre 20.4 %   GLOBAL Pre 24.88 %      PHQ-9: Recent Review Flowsheet Data    There is no flowsheet data to display.     Interpretation of Total Score  Total Score Depression Severity:  1-4 = Minimal depression, 5-9 = Mild depression, 10-14 = Moderate depression, 15-19 = Moderately severe depression, 20-27 = Severe depression   Psychosocial Evaluation and Intervention:     Psychosocial Evaluation - 08/08/16 1647      Psychosocial Evaluation & Interventions   Interventions Encouraged to exercise with the program and follow exercise prescription   Comments no psychosocial needs identified, no internvetions necessary.    Expected Outcomes pt will exhibit positive outlook with good coping skills.    Continue Psychosocial Services  No Follow up required      Psychosocial Re-Evaluation:     Psychosocial Re-Evaluation    [place] Name 08/31/16 1742 09/28/16 1721 10/25/16 1650         Psychosocial Re-Evaluation   Current issues with None Identified None Identified None Identified     Interventions Encouraged to attend Cardiac Rehabilitation for the exercise Encouraged to attend Cardiac Rehabilitation for the exercise Encouraged to attend Cardiac Rehabilitation for  the exercise     Continue Psychosocial Services  No Follow up required No Follow up required No Follow up required        Psychosocial Discharge (Final Psychosocial Re-Evaluation):     Psychosocial Re-Evaluation - 10/25/16 1650      Psychosocial Re-Evaluation   Current issues with None Identified   Interventions Encouraged to attend Cardiac Rehabilitation for the exercise   Continue Psychosocial Services  No Follow up required      Vocational Rehabilitation: Provide vocational rehab assistance to qualifying candidates.   Vocational Rehab Evaluation & Intervention:     Vocational Rehab - 08/02/16 1722      Initial Vocational Rehab Evaluation & Intervention   Assessment shows need for Vocational Rehabilitation Yes      Education: Education Goals: Education classes will be provided on a weekly basis, covering required topics. Participant will state understanding/return demonstration of topics presented.  Learning Barriers/Preferences:     Learning Barriers/Preferences - 08/02/16 0830      Learning Barriers/Preferences   Learning Barriers Sight   Learning Preferences Verbal Instruction      Education Topics: Count Your Pulse:  -Group instruction provided by verbal instruction, demonstration, patient participation and written materials to support subject.  Instructors address importance of being able to find your pulse and how to count your pulse when at home without a heart monitor.  Patients get hands on experience counting their pulse with staff help and individually.   CARDIAC REHAB PHASE II EXERCISE from 10/05/2016 in [place]  Date  08/12/16  Instruction Review Code  2- meets goals/outcomes      Heart Attack, Angina, and Risk  Factor Modification:  -Group instruction provided by verbal instruction, video, and written materials to support subject.  Instructors address signs and symptoms of angina and heart attacks.    Also discuss  risk factors for heart disease and how to make changes to improve heart health risk factors.   Functional Fitness:  -Group instruction provided by verbal instruction, demonstration, patient participation, and written materials to support subject.  Instructors address safety measures for doing things around the house.  Discuss how to get up and down off the floor, how to pick things up properly, how to safely get out of a chair without assistance, and balance training.   CARDIAC REHAB PHASE II EXERCISE from 10/05/2016 in [place]  Date  08/26/16  Instruction Review Code  2- meets goals/outcomes      Meditation and Mindfulness:  -Group instruction provided by verbal instruction, patient participation, and written materials to support subject.  Instructor addresses importance of mindfulness and meditation practice to help reduce stress and improve awareness.  Instructor also leads participants through a meditation exercise.    CARDIAC REHAB PHASE II EXERCISE from 10/05/2016 in [facility]  Date  09/14/16  Instruction Review Code  2- meets goals/outcomes      Stretching for Flexibility and Mobility:  -Group instruction provided by verbal instruction, patient participation, and written materials to support subject.  Instructors lead participants through series of stretches that are designed to increase flexibility thus improving mobility.  These stretches are additional exercise for major muscle groups that are typically performed during regular warm up and cool down.   CARDIAC REHAB PHASE II EXERCISE from 10/05/2016 in [place]  Date  09/30/16  Instruction Review Code  2- meets goals/outcomes      Hands Only CPR:  -Group verbal, video, and participation provides a basic overview of AHA guidelines for community CPR. Role-play of emergencies allow participants the opportunity to practice calling for  help and chest compression technique with discussion of AED use.   Hypertension: -Group verbal and written instruction that provides a basic overview of hypertension including the most recent diagnostic guidelines, risk factor reduction with self-care instructions and medication management.    Nutrition I class: Heart Healthy Eating:  -Group instruction provided by PowerPoint slides, verbal discussion, and written materials to support subject matter. The instructor gives an explanation and review of the Therapeutic Lifestyle Changes diet recommendations, which includes a discussion on lipid goals, dietary fat, sodium, fiber, plant stanol/sterol esters, sugar, and the components of a well-balanced, healthy diet.   Nutrition II class: Lifestyle Skills:  -Group instruction provided by PowerPoint slides, verbal discussion, and written materials to support subject matter. The instructor gives an explanation and review of label reading, grocery shopping for heart health, heart healthy recipe modifications, and ways to make healthier choices when eating out.   Diabetes Question & Answer:  -Group instruction provided by PowerPoint slides, verbal discussion, and written materials to support subject matter. The instructor gives an explanation and review of diabetes co-morbidities, pre- and post-prandial blood glucose goals, pre-exercise blood glucose goals, signs, symptoms, and treatment of hypoglycemia and hyperglycemia, and foot care basics.   Diabetes Blitz:  -Group instruction provided by PowerPoint slides, verbal discussion, and written materials to support subject matter. The instructor gives an explanation and review of the physiology behind type 1 and type 2 diabetes, diabetes medications and rational behind using different medications, pre- and post-prandial blood glucose recommendations  and Hemoglobin A1c goals, diabetes diet, and exercise including blood glucose guidelines for exercising  safely.    Portion Distortion:  -Group instruction provided by PowerPoint slides, verbal discussion, written materials, and food models to support subject matter. The instructor gives an explanation of serving size versus portion size, changes in portions sizes over the last 20 years, and what consists of a serving from each food group.   CARDIAC REHAB PHASE II EXERCISE from 10/05/2016 in [place]  Date  09/28/16  Educator  RD  Instruction Review Code  2- meets goals/outcomes      Stress Management:  -Group instruction provided by verbal instruction, video, and written materials to support subject matter.  Instructors review role of stress in heart disease and how to cope with stress positively.     CARDIAC REHAB PHASE II EXERCISE from 10/05/2016 in [place]  Date  10/05/16  Instruction Review Code  2- meets goals/outcomes      Exercising on Your Own:  -Group instruction provided by verbal instruction, power point, and written materials to support subject.  Instructors discuss benefits of exercise, components of exercise, frequency and intensity of exercise, and end points for exercise.  Also discuss use of nitroglycerin and activating EMS.  Review options of places to exercise outside of rehab.  Review guidelines for sex with heart disease.   CARDIAC REHAB PHASE II EXERCISE from 10/05/2016 in [place]  Date  08/24/16  Instruction Review Code  2- meets goals/outcomes      Cardiac Drugs I:  -Group instruction provided by verbal instruction and written materials to support subject.  Instructor reviews cardiac drug classes: antiplatelets, anticoagulants, beta blockers, and statins.  Instructor discusses reasons, side effects, and lifestyle considerations for each drug class.   Cardiac Drugs II:  -Group instruction provided by verbal instruction and written materials to support subject.   Instructor reviews cardiac drug classes: angiotensin converting enzyme inhibitors (ACE-I), angiotensin II receptor blockers (ARBs), nitrates, and calcium channel blockers.  Instructor discusses reasons, side effects, and lifestyle considerations for each drug class.   CARDIAC REHAB PHASE II EXERCISE from 10/05/2016 in [place]  Date  09/21/16  Instruction Review Code  2- meets goals/outcomes      Anatomy and Physiology of the Circulatory System:  Group verbal and written instruction and models provide basic cardiac anatomy and physiology, with the coronary electrical and arterial systems. Review of: AMI, Angina, Valve disease, Heart Failure, Peripheral Artery Disease, Cardiac Arrhythmia, Pacemakers, and the ICD.   CARDIAC REHAB PHASE II EXERCISE from 10/05/2016 in [place]  Date  09/07/16  Instruction Review Code  2- meets goals/outcomes      Other Education:  -Group or individual verbal, written, or video instructions that support the educational goals of the cardiac rehab program.   Knowledge Questionnaire Score:     Knowledge Questionnaire Score - 08/02/16 0956      Knowledge Questionnaire Score   Pre Score 15/24      Core Components/Risk Factors/Patient Goals at Admission:     Personal Goals and Risk Factors at Admission - 08/02/16 1730      Core Components/Risk Factors/Patient Goals on Admission    Weight Management Yes;Obesity   Intervention Weight Management: Develop a combined nutrition and exercise program designed to reach desired caloric intake, while maintaining appropriate intake of nutrient and fiber, sodium and fats, and appropriate energy  expenditure required for the weight goal.;Weight Management: Provide education and appropriate resources to help participant work on and attain dietary goals.;Weight Management/Obesity: Establish reasonable short term and long term weight goals.;Obesity: Provide  education and appropriate resources to help participant work on and attain dietary goals.   Admit Weight 215 lb 6.2 oz (97.7 kg)   Goal Weight: Long Term 191 lb (86.6 kg)   Expected Outcomes Short Term: Continue to assess and modify interventions until short term weight is achieved;Long Term: Adherence to nutrition and physical activity/exercise program aimed toward attainment of established weight goal;Weight Loss: Understanding of general recommendations for a balanced deficit meal plan, which promotes 1-2 lb weight loss per week and includes a negative energy balance of [phone] kcal/d   Hypertension Yes   Intervention Provide education on lifestyle modifcations including regular physical activity/exercise, weight management, moderate sodium restriction and increased consumption of fresh fruit, vegetables, and low fat dairy, alcohol moderation, and smoking cessation.;Monitor prescription use compliance.   Expected Outcomes Short Term: Continued assessment and intervention until BP is < 140/7mm HG in hypertensive participants. < 130/31mm HG in hypertensive participants with diabetes, heart failure or chronic kidney disease.;Long Term: Maintenance of blood pressure at goal levels.   Lipids Yes   Intervention Provide education and support for participant on nutrition & aerobic/resistive exercise along with prescribed medications to achieve LDL 70mg , HDL >40mg .   Expected Outcomes Short Term: Participant states understanding of desired cholesterol values and is compliant with medications prescribed. Participant is following exercise prescription and nutrition guidelines.;Long Term: Cholesterol controlled with medications as prescribed, with individualized exercise RX and with personalized nutrition plan. Value goals: LDL < 70mg , HDL > 40 mg.      Core Components/Risk Factors/Patient Goals Review:    Core Components/Risk Factors/Patient Goals at Discharge (Final Review):    ITP Comments:      ITP Comments    Row Name 08/02/16 0827           ITP Comments Medical Director, Dr. Fransico Him          Comments: Zarah is making expected progress toward personal goals after completing 24 sessions. Recommend continued exercise and life style modification education including  stress management and relaxation techniques to decrease cardiac risk profile. Eknoor continues to do well with exercise.Barnet Pall, RN,BSN 10/25/2016 4:58 PM

## 2016-10-26 ENCOUNTER — Encounter (HOSPITAL_COMMUNITY)
Admission: RE | Admit: 2016-10-26 | Discharge: 2016-10-26 | Disposition: A | Discharge status: Home / Self Care | Payer: 59 | Source: Ambulatory Visit | Attending: Cardiovascular Disease | Admitting: Cardiovascular Disease

## 2016-10-26 DIAGNOSIS — Z955 Presence of coronary angioplasty implant and graft: Secondary | ICD-10-CM

## 2016-10-28 ENCOUNTER — Encounter (HOSPITAL_COMMUNITY): Payer: 59

## 2016-10-31 ENCOUNTER — Encounter (HOSPITAL_COMMUNITY): Payer: 59

## 2016-11-02 ENCOUNTER — Encounter (HOSPITAL_COMMUNITY)
Admission: RE | Admit: 2016-11-02 | Discharge: 2016-11-02 | Disposition: A | Discharge status: Home / Self Care | Payer: 59 | Source: Ambulatory Visit | Attending: Cardiovascular Disease | Admitting: Cardiovascular Disease

## 2016-11-02 DIAGNOSIS — Z955 Presence of coronary angioplasty implant and graft: Secondary | ICD-10-CM

## 2016-11-04 ENCOUNTER — Encounter (HOSPITAL_COMMUNITY)
Admission: RE | Admit: 2016-11-04 | Discharge: 2016-11-04 | Disposition: A | Discharge status: Home / Self Care | Payer: 59 | Source: Ambulatory Visit | Attending: Cardiovascular Disease | Admitting: Cardiovascular Disease

## 2016-11-04 DIAGNOSIS — Z955 Presence of coronary angioplasty implant and graft: Secondary | ICD-10-CM

## 2016-11-07 ENCOUNTER — Encounter (HOSPITAL_COMMUNITY)
Admission: RE | Admit: 2016-11-07 | Discharge: 2016-11-07 | Disposition: A | Discharge status: Home / Self Care | Payer: 59 | Source: Ambulatory Visit | Attending: Cardiovascular Disease | Admitting: Cardiovascular Disease

## 2016-11-07 DIAGNOSIS — Z955 Presence of coronary angioplasty implant and graft: Secondary | ICD-10-CM

## 2016-11-09 ENCOUNTER — Encounter (HOSPITAL_COMMUNITY)
Admission: RE | Admit: 2016-11-09 | Discharge: 2016-11-09 | Disposition: A | Discharge status: Home / Self Care | Payer: 59 | Source: Ambulatory Visit | Attending: Cardiovascular Disease | Admitting: Cardiovascular Disease

## 2016-11-09 DIAGNOSIS — Z7902 Long term (current) use of antithrombotics/antiplatelets: Secondary | ICD-10-CM | POA: Insufficient documentation

## 2016-11-09 DIAGNOSIS — I10 Essential (primary) hypertension: Secondary | ICD-10-CM | POA: Insufficient documentation

## 2016-11-09 DIAGNOSIS — Z79899 Other long term (current) drug therapy: Secondary | ICD-10-CM | POA: Diagnosis not present

## 2016-11-09 DIAGNOSIS — Z955 Presence of coronary angioplasty implant and graft: Secondary | ICD-10-CM | POA: Diagnosis present

## 2016-11-09 DIAGNOSIS — M1990 Unspecified osteoarthritis, unspecified site: Secondary | ICD-10-CM | POA: Diagnosis not present

## 2016-11-09 DIAGNOSIS — Z7982 Long term (current) use of aspirin: Secondary | ICD-10-CM | POA: Insufficient documentation

## 2016-11-11 ENCOUNTER — Encounter (HOSPITAL_COMMUNITY)
Admission: RE | Admit: 2016-11-11 | Discharge: 2016-11-11 | Disposition: A | Discharge status: Home / Self Care | Payer: 59 | Source: Ambulatory Visit | Attending: Cardiovascular Disease | Admitting: Cardiovascular Disease

## 2016-11-11 DIAGNOSIS — Z955 Presence of coronary angioplasty implant and graft: Secondary | ICD-10-CM | POA: Diagnosis not present

## 2016-11-14 ENCOUNTER — Encounter (HOSPITAL_COMMUNITY)
Admission: RE | Admit: 2016-11-14 | Discharge: 2016-11-14 | Disposition: A | Discharge status: Home / Self Care | Payer: 59 | Source: Ambulatory Visit | Attending: Cardiovascular Disease | Admitting: Cardiovascular Disease

## 2016-11-14 DIAGNOSIS — Z955 Presence of coronary angioplasty implant and graft: Secondary | ICD-10-CM

## 2016-11-16 ENCOUNTER — Encounter (HOSPITAL_COMMUNITY)
Admission: RE | Admit: 2016-11-16 | Discharge: 2016-11-16 | Disposition: A | Discharge status: Home / Self Care | Payer: 59 | Source: Ambulatory Visit

## 2016-11-16 DIAGNOSIS — Z955 Presence of coronary angioplasty implant and graft: Secondary | ICD-10-CM | POA: Diagnosis not present

## 2016-11-18 ENCOUNTER — Encounter (HOSPITAL_COMMUNITY)
Admission: RE | Admit: 2016-11-18 | Discharge: 2016-11-18 | Disposition: A | Discharge status: Home / Self Care | Payer: 59 | Source: Ambulatory Visit | Attending: Cardiovascular Disease | Admitting: Cardiovascular Disease

## 2016-11-18 DIAGNOSIS — Z955 Presence of coronary angioplasty implant and graft: Secondary | ICD-10-CM

## 2016-11-21 ENCOUNTER — Encounter (HOSPITAL_COMMUNITY)
Admission: RE | Admit: 2016-11-21 | Discharge: 2016-11-21 | Disposition: A | Discharge status: Home / Self Care | Payer: 59 | Source: Ambulatory Visit | Attending: Cardiovascular Disease | Admitting: Cardiovascular Disease

## 2016-11-21 DIAGNOSIS — Z955 Presence of coronary angioplasty implant and graft: Secondary | ICD-10-CM | POA: Diagnosis not present

## 2016-11-23 ENCOUNTER — Encounter (HOSPITAL_COMMUNITY)
Admission: RE | Admit: 2016-11-23 | Discharge: 2016-11-23 | Disposition: A | Discharge status: Home / Self Care | Payer: 59 | Source: Ambulatory Visit | Attending: Cardiovascular Disease | Admitting: Cardiovascular Disease

## 2016-11-23 VITALS — Ht 66.5 in | Wt 212.7 lb

## 2016-11-23 DIAGNOSIS — Z955 Presence of coronary angioplasty implant and graft: Secondary | ICD-10-CM | POA: Diagnosis not present

## 2016-11-23 NOTE — Progress Notes (Signed)
Discharge Summary  Patient Details  Name: Kristina [name] MRN: [id number] Date of Birth: 19-Dec-1945 Referring Provider:     [place] from 08/02/2016 in [place]  Referring Provider  Quay Burow, MD.       Number of Visits: 36  Reason for Discharge:  Patient independent in their exercise.  Smoking History:  History  Smoking Status  . Never Smoker  Smokeless Tobacco  . Never Used    Diagnosis:  06/23/16 Status post coronary artery stent placement  ADL [facility]SD:   Initial Exercise Prescription:     Initial Exercise Prescription - 08/02/16 1500      Date of Initial Exercise RX and Referring Provider   Date 08/02/16   Referring Provider Quay Burow, MD.     Treadmill   MPH 2.3   Grade 1   Minutes 10   METs 3.08     Bike   Level 1.2   Minutes 10   METs 3.32     NuStep   Level 3   SPM 85   Minutes 10   METs 2.8     Prescription Details   Frequency (times per week) 3   Duration Progress to 30 minutes of continuous aerobic without signs/symptoms of physical distress     Intensity   THRR 40-80% of Max Heartrate 61-122   Ratings of Perceived Exertion 11-13   Perceived Dyspnea 0-4     Progression   Progression Continue to progress workloads to maintain intensity without signs/symptoms of physical distress.     Resistance Training   Training Prescription Yes   Weight 3lbs.   Reps 10-15      Discharge Exercise Prescription (Final Exercise Prescription Changes):     Exercise Prescription Changes - 11/21/16 1600      Response to Exercise   Blood Pressure (Admit) 120/72   Blood Pressure (Exercise) 160/80   Blood Pressure (Exit) 118/60   Heart Rate (Admit) 83 bpm   Heart Rate (Exercise) 116 bpm   Heart Rate (Exit) 85 bpm   Rating of Perceived Exertion (Exercise) 13   Duration Progress to 45 minutes of aerobic exercise without signs/symptoms of physical distress   Intensity THRR  unchanged     Progression   Progression Continue to progress workloads to maintain intensity without signs/symptoms of physical distress.   Average METs 3.8     Resistance Training   Training Prescription Yes   Weight 3lb   Reps 10-15     Treadmill   MPH 3   Grade 2   Minutes 10   METs 4.12     Bike   Level 1.2   Minutes 10   METs 3.32     NuStep   Level 6   SPM 85   Minutes 10   METs 4     Home Exercise Plan   Plans to continue exercise at Home (comment)   Frequency Add 3 additional days to program exercise sessions.      Functional Capacity:     6 Minute Walk    Row Name 08/02/16 1508 11/22/16 1145       6 Minute Walk   Phase Initial Discharge  11/09/16 1500    Distance 1609 feet 1822 feet    Distance % Change  - 13.2 %    Walk Time 6 minutes 6 minutes    # of Rest Breaks 0 0    MPH 3.05 3.5  METS 3.29 3.6    RPE 11 9    VO2 Peak 11.53 12.5    Symptoms No No    Resting HR 81 bpm 72 bpm    Resting BP 128/80 112/76    Max Ex. HR 107 bpm 101 bpm    Max Ex. BP 140/90 132/80    2 Minute Post BP 138/86  -       Psychological, QOL, Others - Outcomes: PHQ 2/9: Depression screen PHQ 2/9 11/23/2016  Decreased Interest 0  Down, Depressed, Hopeless 0  PHQ - 2 Score 0    Quality of Life:     Quality of Life - 11/23/16 1500      Quality of Life Scores   Health/Function Pre 22.8 %   Health/Function Post 24.03 %   Health/Function % Change 5.39 %   Socioeconomic Pre 29.14 %   Socioeconomic Post 25.81 %   Socioeconomic % Change  -11.43 %   Psych/Spiritual Pre 28.29 %   Psych/Spiritual Post 30 %   Psych/Spiritual % Change 6.04 %   Family Pre 20.4 %   Family Post 22.3 %   Family % Change 9.31 %   GLOBAL Pre 24.88 %   GLOBAL Post 25.39 %   GLOBAL % Change 2.05 %      Personal Goals: Goals established at orientation with interventions provided to work toward goal.     Personal Goals and Risk Factors at Admission - 08/02/16 1730      Core  Components/Risk Factors/Patient Goals on Admission    Weight Management Yes;Obesity   Intervention Weight Management: Develop a combined nutrition and exercise program designed to reach desired caloric intake, while maintaining appropriate intake of nutrient and fiber, sodium and fats, and appropriate energy expenditure required for the weight goal.;Weight Management: Provide education and appropriate resources to help participant work on and attain dietary goals.;Weight Management/Obesity: Establish reasonable short term and long term weight goals.;Obesity: Provide education and appropriate resources to help participant work on and attain dietary goals.   Admit Weight 215 lb 6.2 oz (97.7 kg)   Goal Weight: Long Term 191 lb (86.6 kg)   Expected Outcomes Short Term: Continue to assess and modify interventions until short term weight is achieved;Long Term: Adherence to nutrition and physical activity/exercise program aimed toward attainment of established weight goal;Weight Loss: Understanding of general recommendations for a balanced deficit meal plan, which promotes 1-2 lb weight loss per week and includes a negative energy balance of [phone] kcal/d   Hypertension Yes   Intervention Provide education on lifestyle modifcations including regular physical activity/exercise, weight management, moderate sodium restriction and increased consumption of fresh fruit, vegetables, and low fat dairy, alcohol moderation, and smoking cessation.;Monitor prescription use compliance.   Expected Outcomes Short Term: Continued assessment and intervention until BP is < 140/63m HG in hypertensive participants. < 130/882mHG in hypertensive participants with diabetes, heart failure or chronic kidney disease.;Long Term: Maintenance of blood pressure at goal levels.   Lipids Yes   Intervention Provide education and support for participant on nutrition & aerobic/resistive exercise along with prescribed medications to achieve LDL  <707mHDL >20m20m Expected Outcomes Short Term: Participant states understanding of desired cholesterol values and is compliant with medications prescribed. Participant is following exercise prescription and nutrition guidelines.;Long Term: Cholesterol controlled with medications as prescribed, with individualized exercise RX and with personalized nutrition plan. Value goals: LDL < 70mg69mL > 40 mg.       Personal Goals Discharge:  Goals and Risk Factor Review    Row Name 12/14/16 0825             Core Components/Risk Factors/Patient Goals Review   Personal Goals Review Weight Management/Obesity       Review Pt has lost 2.7 lb. Wt loss goal minimum wt loss of 6 lb not met.        Expected Outcomes Continue to encourage diet and lifestyle choices to promote slow wt loss of 1-2 lb/week.           Nutrition & Weight - Outcomes:     Pre Biometrics - 08/02/16 1511      Pre Biometrics   Height 5' 6.5" (1.689 m)   Weight 215 lb 6.2 oz (97.7 kg)   Waist Circumference 42.5 inches   Hip Circumference 49.5 inches   Waist to Hip Ratio 0.86 %   BMI (Calculated) 34.3   Triceps Skinfold 28 mm   % Body Fat 44.6 %   Grip Strength 35 kg   Flexibility 8 in   Single Leg Stand 15.93 seconds         Post Biometrics - 11/23/16 1500       Post  Biometrics   Height 5' 6.5" (1.689 m)   Weight 212 lb 11.9 oz (96.5 kg)   Waist Circumference 43.5 inches   Hip Circumference 50.75 inches   Waist to Hip Ratio 0.86 %   BMI (Calculated) 33.83   Triceps Skinfold 30 mm   % Body Fat 45.2 %   Grip Strength 33 kg   Flexibility 8.5 in   Single Leg Stand 20.02 seconds      Nutrition:     Nutrition Therapy & Goals - 08/23/16 1232      Nutrition Therapy   Diet Therapeutic Lifestyle Changes     Personal Nutrition Goals   Nutrition Goal Pt to identify and limit food sources of saturated fat, trans fat, and sodium.    Personal Goal #2 Pt to lose 1-2 lb/week to a wt loss goal of 6-24 lb  at graduation from rehab     [place], educate and counsel regarding individualized specific dietary modifications aiming towards targeted core components such as weight, hypertension, lipid management, diabetes, heart failure and other comorbidities.   Expected Outcomes Short Term Goal: Understand basic principles of dietary content, such as calories, fat, sodium, cholesterol and nutrients.;Long Term Goal: Adherence to prescribed nutrition plan.      Nutrition Discharge:     Nutrition Assessments - 12/14/16 0823      MEDFICTS Scores   Pre Score 44   Post Score 9   Score Difference -35      Education Questionnaire Score:     Knowledge Questionnaire Score - 11/23/16 1500      Knowledge Questionnaire Score   Pre Score 15/24   Post Score 19/24      Goals reviewed with patient; copy given to patient.Serenitie graduated from cardiac rehab program today with completion of 36 exercise sessions in Phase II. Pt maintained good attendance and progressed nicely during her participation in rehab as evidenced by increased MET level.   Medication list reconciled. Repeat  PHQ score-0.  Lorna has made significant lifestyle changes and should be commended for his success. Joye feels she has achieved her goals during cardiac rehab.  Sheran plans to continue exercise on her own. Eryn has a bike at home and has access to a gym at work.Barnet Pall, RN,BSN  12/20/2016 10:25 AM

## 2017-04-11 DIAGNOSIS — T148XXA Other injury of unspecified body region, initial encounter: Secondary | ICD-10-CM

## 2017-04-11 HISTORY — DX: Other injury of unspecified body region, initial encounter: T14.8XXA

## 2017-10-13 ENCOUNTER — Encounter: Payer: Self-pay | Admitting: Cardiovascular Disease

## 2017-10-13 ENCOUNTER — Ambulatory Visit: Payer: 59 | Admitting: Cardiovascular Disease

## 2017-10-13 VITALS — BP 124/81 | HR 65 | Ht 66.0 in | Wt 212.6 lb

## 2017-10-13 DIAGNOSIS — I10 Essential (primary) hypertension: Secondary | ICD-10-CM | POA: Diagnosis not present

## 2017-10-13 DIAGNOSIS — I2510 Atherosclerotic heart disease of native coronary artery without angina pectoris: Secondary | ICD-10-CM | POA: Diagnosis not present

## 2017-10-13 DIAGNOSIS — E785 Hyperlipidemia, unspecified: Secondary | ICD-10-CM

## 2017-10-13 DIAGNOSIS — Z5181 Encounter for therapeutic drug level monitoring: Secondary | ICD-10-CM | POA: Diagnosis not present

## 2017-10-13 DIAGNOSIS — I200 Unstable angina: Secondary | ICD-10-CM

## 2017-10-13 DIAGNOSIS — Z7902 Long term (current) use of antithrombotics/antiplatelets: Secondary | ICD-10-CM

## 2017-10-13 DIAGNOSIS — Z9861 Coronary angioplasty status: Secondary | ICD-10-CM

## 2017-10-13 MED ORDER — CLOPIDOGREL BISULFATE 75 MG PO TABS: 75.0000 mg | ORAL_TABLET | Freq: Every day | ORAL | 3 refills | Status: DC

## 2017-10-13 NOTE — Assessment & Plan Note (Signed)
History of CAD status post non-STEMI with 4 mm Synergy drug-eluting stent postdilated to 4.5 mm.  She did have moderately severe disease of a circumflex obtuse marginal branch and a very tortuous vessel and it was elected to treat this medically with noncritical disease in her RCA normal LV function.  She is had no symptoms since I last saw her in the hospital.  She is on dual antiplatelet therapy including aspirin and Brilinta which I will transition to Plavix.

## 2017-10-13 NOTE — Assessment & Plan Note (Signed)
History of essential hypertension with blood amlodipine, metoprolol and losartan.  Continue current meds at current dosing.

## 2017-10-13 NOTE — Patient Instructions (Signed)
Medication Instructions: Your physician recommends that you continue on your current medications as directed. Please refer to the Current Medication list given to you today.  STOP Brilinta--ok to finish what you have left   START Plavix (Clopidogrel) 75 mg   Labwork: Your physician recommends that you return for lab work in: 2 weeks after starting Plavix at [facility] lab. You will need to stop at registration.   Follow-Up: Your physician wants you to follow-up in: 1 year with Dr. Gwenlyn Found. You will receive a reminder letter in the mail two months in advance. If you don't receive a letter, please call our office to schedule the follow-up appointment.  If you need a refill on your cardiac medications before your next appointment, please call your pharmacy.

## 2017-10-13 NOTE — Progress Notes (Signed)
10/13/2017 Kristina [name]   17-Jul-1945  [id number]  Primary Physician Kristina Ebbs, MD Primary Cardiologist: [name] MD Kristina [name], [place]  HPI:  Kristina [name] is a 72 y.o. moderately overweight single African-American female mother of 2, grandmother of 6 grandchildren and great grandmother of 4 great grandsons who I am seeing back for 1 year follow-up.  Primary care provider is Kristina [name].  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  She has a history of treated hypertension and hyperlipidemia.  She does not smoke and drinks socially.  She had a non-STEMI 06/22/2016 and underwent cardiac catheterization by Dr. Burt [name] at that time the following day revealing a high-grade proximal LAD stenosis treated with a 4 mm Synergy drug-eluting stent postdilated to 4.5 mm.  She did have moderately high-grade circumflex obtuse marginal branch over this was a tortuous vessel and was treated medically.  Her RCA was free of disease and she had normal LV function.  Since she was seen back on 07/12/2016 by Kristina Ransom, PA-C she has been completely asymptomatic.   Current Meds  Medication Sig  . amLODipine (NORVASC) 5 MG tablet Take 5 mg by mouth daily.  [name] aspirin 81 MG chewable tablet Chew 1 tablet (81 mg total) by mouth daily.  [name] atorvastatin (LIPITOR) 80 MG tablet Take 1 tablet (80 mg total) by mouth daily at 6 PM.  . Cholecalciferol (VITAMIN D PO) Take 1 capsule by mouth daily.  . furosemide (LASIX) 20 MG tablet Take 20 mg by mouth daily.   [name] losartan (COZAAR) 100 MG tablet Take 100 mg by mouth daily.   . metoprolol tartrate (LOPRESSOR) 25 MG tablet Take 0.5 tablets (12.5 mg total) by mouth 2 (two) times daily. (Patient taking differently: Take 25 mg by mouth 2 (two) times daily. )  . [DISCONTINUED] ticagrelor (BRILINTA) 90 MG TABS tablet Take 1 tablet (90 mg total) by mouth 2 (two) times daily.     No Known Allergies  Social  History   Socioeconomic History  . Marital status: Divorced    Spouse name: Not on file  . Number of children: Not on file  . Years of education: Not on file  . Highest education level: Not on file  Occupational History  . Not on file  Social Needs  . Financial resource strain: Not on file  . Food insecurity:    Worry: Not on file    Inability: Not on file  . Transportation needs:    Medical: Not on file    Non-medical: Not on file  Tobacco Use  . Smoking status: Never Smoker  . Smokeless tobacco: Never Used  Substance and Sexual Activity  . Alcohol use: Yes    Alcohol/week: 3.6 oz    Types: 6 Glasses of wine per week  . Drug use: No  . Sexual activity: Not Currently  Lifestyle  . Physical activity:    Days per week: Not on file    Minutes per session: Not on file  . Stress: Not on file  Relationships  . Social connections:    Talks on phone: Not on file    Gets together: Not on file    Attends religious service: Not on file    Active member of club or organization: Not on file    Attends meetings of clubs or organizations: Not on file    Relationship status: Not on file  . Intimate  partner violence:    Fear of current or ex partner: Not on file    Emotionally abused: Not on file    Physically abused: Not on file    Forced sexual activity: Not on file  Other Topics Concern  . Not on file  Social History Narrative  . Not on file     Review of Systems: General: negative for chills, fever, night sweats or weight changes.  Cardiovascular: negative for chest pain, dyspnea on exertion, edema, orthopnea, palpitations, paroxysmal nocturnal dyspnea or shortness of breath Dermatological: negative for rash Respiratory: negative for cough or wheezing Urologic: negative for hematuria Abdominal: negative for nausea, vomiting, diarrhea, bright red blood per rectum, melena, or hematemesis Neurologic: negative for visual changes, syncope, or dizziness All other systems  reviewed and are otherwise negative except as noted above.    Blood pressure 124/81, pulse 65, height 5\' 6"  (1.676 m), weight 212 lb 9.6 oz (96.4 kg).  General appearance: alert and no distress Neck: no adenopathy, no carotid bruit, no JVD, supple, symmetrical, trachea midline and thyroid not enlarged, symmetric, no tenderness/mass/nodules Lungs: clear to auscultation bilaterally Heart: regular rate and rhythm, S1, S2 normal, no murmur, click, rub or gallop Extremities: extremities normal, atraumatic, no cyanosis or edema Pulses: 2+ and symmetric Skin: Skin color, texture, turgor normal. No rashes or lesions Neurologic: Alert and oriented X 3, normal strength and tone. Normal symmetric reflexes. Normal coordination and gait  EKG sinus rhythm at 65 without ST or T wave changes.  I personally reviewed this EKG.  ASSESSMENT AND PLAN:   Benign essential HTN History of essential hypertension with blood amlodipine, metoprolol and losartan.  Continue current meds at current dosing.  CAD S/P percutaneous coronary angioplasty History of CAD status post non-STEMI with 4 mm Synergy drug-eluting stent postdilated to 4.5 mm.  She did have moderately severe disease of a circumflex obtuse marginal branch and a very tortuous vessel and it was elected to treat this medically with noncritical disease in her RCA normal LV function.  She is had no symptoms since I last saw her in the hospital.  She is on dual antiplatelet therapy including aspirin and Brilinta which I will transition to Plavix.  Dyslipidemia History of dyslipidemia on statin therapy.      [name] MD FACP,FACC,FAHA, [facility] 10/13/2017 8:40 AM

## 2017-10-13 NOTE — Assessment & Plan Note (Signed)
History of dyslipidemia on statin therapy 

## 2017-11-13 ENCOUNTER — Encounter: Payer: Self-pay | Admitting: *Deleted

## 2017-11-13 ENCOUNTER — Other Ambulatory Visit: Payer: Self-pay | Admitting: *Deleted

## 2017-11-13 DIAGNOSIS — I2510 Atherosclerotic heart disease of native coronary artery without angina pectoris: Secondary | ICD-10-CM

## 2017-11-13 DIAGNOSIS — Z9861 Coronary angioplasty status: Principal | ICD-10-CM

## 2017-11-27 ENCOUNTER — Other Ambulatory Visit (HOSPITAL_COMMUNITY)
Admission: RE | Admit: 2017-11-27 | Discharge: 2017-11-27 | Disposition: A | Discharge status: Home / Self Care | Payer: 59 | Source: Ambulatory Visit | Attending: Cardiovascular Disease | Admitting: Cardiovascular Disease

## 2017-11-27 DIAGNOSIS — Z9861 Coronary angioplasty status: Secondary | ICD-10-CM | POA: Diagnosis not present

## 2017-11-27 DIAGNOSIS — I2510 Atherosclerotic heart disease of native coronary artery without angina pectoris: Secondary | ICD-10-CM | POA: Diagnosis present

## 2017-11-27 LAB — PLATELET INHIBITION P2Y12: Platelet Function  P2Y12: 192 [PRU] — ABNORMAL LOW (ref 194–418)

## 2018-01-04 ENCOUNTER — Inpatient Hospital Stay (HOSPITAL_COMMUNITY)
Admission: EM | Admit: 2018-01-04 | Discharge: 2018-01-09 | DRG: 605 | Disposition: A | Discharge status: Home Health | Payer: No Typology Code available for payment source | Attending: Surgery | Admitting: Surgery

## 2018-01-04 ENCOUNTER — Emergency Department (HOSPITAL_COMMUNITY): Payer: No Typology Code available for payment source

## 2018-01-04 ENCOUNTER — Encounter (HOSPITAL_COMMUNITY): Payer: Self-pay

## 2018-01-04 DIAGNOSIS — E876 Hypokalemia: Secondary | ICD-10-CM | POA: Diagnosis not present

## 2018-01-04 DIAGNOSIS — G5602 Carpal tunnel syndrome, left upper limb: Secondary | ICD-10-CM | POA: Diagnosis present

## 2018-01-04 DIAGNOSIS — D62 Acute posthemorrhagic anemia: Secondary | ICD-10-CM

## 2018-01-04 DIAGNOSIS — Z90710 Acquired absence of both cervix and uterus: Secondary | ICD-10-CM | POA: Diagnosis not present

## 2018-01-04 DIAGNOSIS — Z7902 Long term (current) use of antithrombotics/antiplatelets: Secondary | ICD-10-CM

## 2018-01-04 DIAGNOSIS — Z801 Family history of malignant neoplasm of trachea, bronchus and lung: Secondary | ICD-10-CM

## 2018-01-04 DIAGNOSIS — Z7901 Long term (current) use of anticoagulants: Secondary | ICD-10-CM

## 2018-01-04 DIAGNOSIS — I110 Hypertensive heart disease with heart failure: Secondary | ICD-10-CM | POA: Diagnosis present

## 2018-01-04 DIAGNOSIS — S7001XA Contusion of right hip, initial encounter: Principal | ICD-10-CM | POA: Diagnosis present

## 2018-01-04 DIAGNOSIS — I2510 Atherosclerotic heart disease of native coronary artery without angina pectoris: Secondary | ICD-10-CM | POA: Diagnosis present

## 2018-01-04 DIAGNOSIS — I959 Hypotension, unspecified: Secondary | ICD-10-CM | POA: Diagnosis present

## 2018-01-04 DIAGNOSIS — I509 Heart failure, unspecified: Secondary | ICD-10-CM | POA: Diagnosis present

## 2018-01-04 DIAGNOSIS — R402362 Coma scale, best motor response, obeys commands, at arrival to emergency department: Secondary | ICD-10-CM | POA: Diagnosis present

## 2018-01-04 DIAGNOSIS — R402252 Coma scale, best verbal response, oriented, at arrival to emergency department: Secondary | ICD-10-CM | POA: Diagnosis present

## 2018-01-04 DIAGNOSIS — R402142 Coma scale, eyes open, spontaneous, at arrival to emergency department: Secondary | ICD-10-CM | POA: Diagnosis present

## 2018-01-04 DIAGNOSIS — S0081XA Abrasion of other part of head, initial encounter: Secondary | ICD-10-CM | POA: Diagnosis present

## 2018-01-04 DIAGNOSIS — M25532 Pain in left wrist: Secondary | ICD-10-CM | POA: Diagnosis not present

## 2018-01-04 DIAGNOSIS — R569 Unspecified convulsions: Secondary | ICD-10-CM | POA: Diagnosis present

## 2018-01-04 DIAGNOSIS — Z7982 Long term (current) use of aspirin: Secondary | ICD-10-CM | POA: Diagnosis not present

## 2018-01-04 DIAGNOSIS — Z9861 Coronary angioplasty status: Secondary | ICD-10-CM

## 2018-01-04 DIAGNOSIS — Z823 Family history of stroke: Secondary | ICD-10-CM

## 2018-01-04 DIAGNOSIS — E785 Hyperlipidemia, unspecified: Secondary | ICD-10-CM | POA: Diagnosis present

## 2018-01-04 DIAGNOSIS — Z8249 Family history of ischemic heart disease and other diseases of the circulatory system: Secondary | ICD-10-CM

## 2018-01-04 DIAGNOSIS — I10 Essential (primary) hypertension: Secondary | ICD-10-CM | POA: Diagnosis present

## 2018-01-04 DIAGNOSIS — Z955 Presence of coronary angioplasty implant and graft: Secondary | ICD-10-CM

## 2018-01-04 DIAGNOSIS — S0083XA Contusion of other part of head, initial encounter: Secondary | ICD-10-CM

## 2018-01-04 LAB — CBC
HEMATOCRIT: 34.5 % — AB (ref 36.0–46.0)
HEMOGLOBIN: 10.7 g/dL — AB (ref 12.0–15.0)
MCH: 29.6 pg (ref 26.0–34.0)
MCHC: 31 g/dL (ref 30.0–36.0)
MCV: 95.3 fL (ref 78.0–100.0)
Platelets: 251 10*3/uL (ref 150–400)
RBC: 3.62 MIL/uL — AB (ref 3.87–5.11)
RDW: 14.4 % (ref 11.5–15.5)
WBC: 13 10*3/uL — AB (ref 4.0–10.5)

## 2018-01-04 LAB — COMPREHENSIVE METABOLIC PANEL
ALBUMIN: 3.5 g/dL (ref 3.5–5.0)
ALT: 15 U/L (ref 0–44)
ANION GAP: 12 (ref 5–15)
AST: 25 U/L (ref 15–41)
Alkaline Phosphatase: 70 U/L (ref 38–126)
BUN: 9 mg/dL (ref 8–23)
CALCIUM: 8.8 mg/dL — AB (ref 8.9–10.3)
CHLORIDE: 105 mmol/L (ref 98–111)
CO2: 22 mmol/L (ref 22–32)
Creatinine, Ser: 0.95 mg/dL (ref 0.44–1.00)
GFR calc non Af Amer: 59 mL/min — ABNORMAL LOW (ref >60)
GLUCOSE: 188 mg/dL — AB (ref 70–99)
POTASSIUM: 3.7 mmol/L (ref 3.5–5.1)
SODIUM: 139 mmol/L (ref 135–145)
Total Bilirubin: 1.5 mg/dL — ABNORMAL HIGH (ref 0.3–1.2)
Total Protein: 6.1 g/dL — ABNORMAL LOW (ref 6.5–8.1)

## 2018-01-04 LAB — ABO/RH: ABO/RH(D): O POS

## 2018-01-04 LAB — BPAM RBC
Blood Product Expiration Date: 201910232359
Blood Product Expiration Date: 201910232359
ISSUE DATE / TIME: 201909261800
ISSUE DATE / TIME: 201909261800
UNIT TYPE AND RH: 9500
Unit Type and Rh: 9500

## 2018-01-04 LAB — TYPE AND SCREEN
ABO/RH(D): O POS
ANTIBODY SCREEN: NEGATIVE
UNIT DIVISION: 0
Unit division: 0

## 2018-01-04 LAB — I-STAT CG4 LACTIC ACID, ED: LACTIC ACID, VENOUS: 2.56 mmol/L — AB (ref 0.5–1.9)

## 2018-01-04 LAB — PREPARE FRESH FROZEN PLASMA
UNIT DIVISION: 0
Unit division: 0

## 2018-01-04 LAB — BPAM FFP
Blood Product Expiration Date: 201910102359
Blood Product Expiration Date: 201910132359
ISSUE DATE / TIME: 201909261802
ISSUE DATE / TIME: 201909261802
UNIT TYPE AND RH: 6200
Unit Type and Rh: 6200

## 2018-01-04 LAB — PROTIME-INR
INR: 1.05
Prothrombin Time: 13.6 seconds (ref 11.4–15.2)

## 2018-01-04 LAB — CBG MONITORING, ED: GLUCOSE-CAPILLARY: 173 mg/dL — AB (ref 70–99)

## 2018-01-04 LAB — I-STAT TROPONIN, ED: Troponin i, poc: 0 ng/mL (ref 0.00–0.08)

## 2018-01-04 MED ORDER — ONDANSETRON HCL 4 MG/2ML IJ SOLN
4.0000 mg | Freq: Once | INTRAMUSCULAR | Status: AC
  Administered 2018-01-04: 4 mg via INTRAVENOUS
  Filled 2018-01-04: qty 2

## 2018-01-04 MED ORDER — SODIUM CHLORIDE 0.9 % IV BOLUS
1000.0000 mL | Freq: Once | INTRAVENOUS | Status: AC
  Administered 2018-01-04: 1000 mL via INTRAVENOUS

## 2018-01-04 MED ORDER — IOHEXOL 300 MG/ML  SOLN
100.0000 mL | Freq: Once | INTRAMUSCULAR | Status: AC | PRN
  Administered 2018-01-04: 100 mL via INTRAVENOUS

## 2018-01-04 MED ORDER — LACTATED RINGERS IV BOLUS
500.0000 mL | Freq: Once | INTRAVENOUS | Status: AC
  Administered 2018-01-04: 500 mL via INTRAVENOUS

## 2018-01-04 MED ORDER — HYDROCODONE-ACETAMINOPHEN 5-325 MG PO TABS
1.0000 | ORAL_TABLET | Freq: Once | ORAL | Status: AC
  Administered 2018-01-04: 1 via ORAL
  Filled 2018-01-04: qty 1
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
## 2018-01-04 NOTE — ED Notes (Addendum)
Pt back from x-ray.

## 2018-01-04 NOTE — ED Notes (Signed)
Pt given another soda. Requesting to be put in a more comfortable bed due to discomfort on current stretcher. This EMT informed her that we were working on getting her a bed and that we would inform her as soon as one has been assigned.

## 2018-01-04 NOTE — ED Notes (Signed)
Meal given
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
## 2018-01-04 NOTE — ED Notes (Signed)
Pt states she is dizzy when sitting up. Pt given time to take medicine. C/o Hip pain.
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
## 2018-01-05 LAB — COMPREHENSIVE METABOLIC PANEL
ALT: 14 U/L (ref 0–44)
AST: 18 U/L (ref 15–41)
Albumin: 3.1 g/dL — ABNORMAL LOW (ref 3.5–5.0)
Alkaline Phosphatase: 63 U/L (ref 38–126)
Anion gap: 9 (ref 5–15)
BUN: 9 mg/dL (ref 8–23)
CO2: 25 mmol/L (ref 22–32)
Calcium: 8.4 mg/dL — ABNORMAL LOW (ref 8.9–10.3)
Chloride: 106 mmol/L (ref 98–111)
Creatinine, Ser: 1.05 mg/dL — ABNORMAL HIGH (ref 0.44–1.00)
GFR calc Af Amer: >60 mL/min (ref >60)
GFR calc non Af Amer: 53 mL/min — ABNORMAL LOW (ref >60)
Glucose, Bld: 162 mg/dL — ABNORMAL HIGH (ref 70–99)
Potassium: 3.5 mmol/L (ref 3.5–5.1)
Sodium: 140 mmol/L (ref 135–145)
Total Bilirubin: 0.5 mg/dL (ref 0.3–1.2)
Total Protein: 5.4 g/dL — ABNORMAL LOW (ref 6.5–8.1)

## 2018-01-05 LAB — CBC
HCT: 27.8 % — ABNORMAL LOW (ref 36.0–46.0)
Hemoglobin: 8.7 g/dL — ABNORMAL LOW (ref 12.0–15.0)
MCH: 29.4 pg (ref 26.0–34.0)
MCHC: 31.3 g/dL (ref 30.0–36.0)
MCV: 93.9 fL (ref 78.0–100.0)
Platelets: 201 10*3/uL (ref 150–400)
RBC: 2.96 MIL/uL — ABNORMAL LOW (ref 3.87–5.11)
RDW: 14.6 % (ref 11.5–15.5)
WBC: 8.8 10*3/uL (ref 4.0–10.5)

## 2018-01-05 LAB — URINALYSIS, ROUTINE W REFLEX MICROSCOPIC
BILIRUBIN URINE: NEGATIVE
GLUCOSE, UA: NEGATIVE mg/dL
HGB URINE DIPSTICK: NEGATIVE
KETONES UR: NEGATIVE mg/dL
NITRITE: NEGATIVE
PROTEIN: NEGATIVE mg/dL
Specific Gravity, Urine: >1.046 — ABNORMAL HIGH (ref 1.005–1.030)
pH: 5 (ref 5.0–8.0)

## 2018-01-05 LAB — BLOOD PRODUCT ORDER (VERBAL) VERIFICATION

## 2018-01-05 LAB — HIV ANTIBODY (ROUTINE TESTING W REFLEX): HIV Screen 4th Generation wRfx: NONREACTIVE

## 2018-01-05 LAB — CDS SEROLOGY

## 2018-01-05 LAB — MRSA PCR SCREENING: MRSA by PCR: NEGATIVE

## 2018-01-05 MED ORDER — DOCUSATE SODIUM 100 MG PO CAPS
200.0000 mg | ORAL_CAPSULE | Freq: Two times a day (BID) | ORAL | Status: DC
  Filled 2018-01-05: qty 2

## 2018-01-05 MED ORDER — LOSARTAN POTASSIUM 50 MG PO TABS: 100.0000 mg | ORAL_TABLET | Freq: Every day | ORAL | Status: DC

## 2018-01-05 MED ORDER — TRAMADOL HCL 50 MG PO TABS: 50.0000 mg | ORAL_TABLET | Freq: Four times a day (QID) | ORAL | Status: DC | PRN

## 2018-01-05 MED ORDER — ATORVASTATIN CALCIUM 80 MG PO TABS
80.0000 mg | ORAL_TABLET | Freq: Every day | ORAL | Status: DC
  Administered 2018-01-05 – 2018-01-08 (×4): 80 mg via ORAL
  Filled 2018-01-05 (×4): qty 1

## 2018-01-05 MED ORDER — ACETAMINOPHEN 500 MG PO TABS
1000.0000 mg | ORAL_TABLET | Freq: Four times a day (QID) | ORAL | Status: DC | PRN
  Administered 2018-01-08: 1000 mg via ORAL
  Filled 2018-01-05: qty 2

## 2018-01-05 MED ORDER — AMLODIPINE BESYLATE 5 MG PO TABS: 5.0000 mg | ORAL_TABLET | Freq: Every day | ORAL | Status: DC

## 2018-01-05 MED ORDER — DOCUSATE SODIUM 100 MG PO CAPS
100.0000 mg | ORAL_CAPSULE | Freq: Two times a day (BID) | ORAL | Status: DC
  Administered 2018-01-05 – 2018-01-09 (×9): 100 mg via ORAL
  Filled 2018-01-05 (×9): qty 1

## 2018-01-05 MED ORDER — LACTATED RINGERS IV SOLN
INTRAVENOUS | Status: DC
  Administered 2018-01-05 (×2): via INTRAVENOUS

## 2018-01-05 MED ORDER — ONDANSETRON HCL 4 MG/2ML IJ SOLN: 4.0000 mg | Freq: Four times a day (QID) | INTRAMUSCULAR | Status: DC | PRN

## 2018-01-05 MED ORDER — ONDANSETRON 4 MG PO TBDP: 4.0000 mg | ORAL_TABLET | Freq: Four times a day (QID) | ORAL | Status: DC | PRN

## 2018-01-05 MED ORDER — FUROSEMIDE 20 MG PO TABS
20.0000 mg | ORAL_TABLET | Freq: Every day | ORAL | Status: DC
  Administered 2018-01-05 – 2018-01-09 (×5): 20 mg via ORAL
  Filled 2018-01-05 (×5): qty 1

## 2018-01-05 MED ORDER — BACITRACIN ZINC 500 UNIT/GM EX OINT
TOPICAL_OINTMENT | Freq: Two times a day (BID) | CUTANEOUS | Status: DC
  Administered 2018-01-05: 02:00:00 via TOPICAL
  Administered 2018-01-05 (×2): 1 via TOPICAL
  Administered 2018-01-06 – 2018-01-09 (×7): via TOPICAL
  Filled 2018-01-05: qty 28.4
  Filled 2018-01-05: qty 28.35

## 2018-01-05 MED ORDER — METOPROLOL TARTRATE 12.5 MG HALF TABLET
12.5000 mg | ORAL_TABLET | Freq: Two times a day (BID) | ORAL | Status: DC
  Administered 2018-01-05 – 2018-01-09 (×9): 12.5 mg via ORAL
  Filled 2018-01-05 (×10): qty 1

## 2018-01-05 MED ORDER — HYDRALAZINE HCL 20 MG/ML IJ SOLN: 10.0000 mg | INTRAMUSCULAR | Status: DC | PRN

## 2018-01-05 MED ORDER — NITROGLYCERIN 0.4 MG SL SUBL: 0.4000 mg | SUBLINGUAL_TABLET | SUBLINGUAL | Status: DC | PRN
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
## 2018-01-05 NOTE — ED Notes (Signed)
Placed patient on a hospital bed.

## 2018-01-05 NOTE — Progress Notes (Signed)
Orthopedic Tech Progress Note Patient Details:  Kristina [name] 13-May-1945 [id number]  Ortho Devices Type of Ortho Device: Velcro wrist splint Ortho Device/Splint Location: lue Ortho Device/Splint Interventions: Ordered, Application, Adjustment   Post Interventions Patient Tolerated: Well Instructions Provided: Care of device, Adjustment of device   Karolee Stamps 01/05/2018, 5:34 AM
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
## 2018-01-06 LAB — BASIC METABOLIC PANEL
ANION GAP: 8 (ref 5–15)
BUN: 9 mg/dL (ref 8–23)
CALCIUM: 8.7 mg/dL — AB (ref 8.9–10.3)
CO2: 27 mmol/L (ref 22–32)
Chloride: 105 mmol/L (ref 98–111)
Creatinine, Ser: 0.91 mg/dL (ref 0.44–1.00)
GFR calc Af Amer: >60 mL/min (ref >60)
GLUCOSE: 124 mg/dL — AB (ref 70–99)
Potassium: 3.4 mmol/L — ABNORMAL LOW (ref 3.5–5.1)
Sodium: 140 mmol/L (ref 135–145)

## 2018-01-06 LAB — CBC
HCT: 26.1 % — ABNORMAL LOW (ref 36.0–46.0)
Hemoglobin: 8.2 g/dL — ABNORMAL LOW (ref 12.0–15.0)
MCH: 29.6 pg (ref 26.0–34.0)
MCHC: 31.4 g/dL (ref 30.0–36.0)
MCV: 94.2 fL (ref 78.0–100.0)
PLATELETS: 188 10*3/uL (ref 150–400)
RBC: 2.77 MIL/uL — ABNORMAL LOW (ref 3.87–5.11)
RDW: 14.6 % (ref 11.5–15.5)
WBC: 5.8 10*3/uL (ref 4.0–10.5)

## 2018-01-06 NOTE — Progress Notes (Signed)
Received from [facility]N . Alert and oriented x 4 , no complaints.  Oriented to room set up.
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
## 2018-01-07 DIAGNOSIS — Z7901 Long term (current) use of anticoagulants: Secondary | ICD-10-CM

## 2018-01-07 DIAGNOSIS — D62 Acute posthemorrhagic anemia: Secondary | ICD-10-CM

## 2018-01-07 DIAGNOSIS — E876 Hypokalemia: Secondary | ICD-10-CM

## 2018-01-07 LAB — MAGNESIUM: Magnesium: 2.1 mg/dL (ref 1.7–2.4)

## 2018-01-07 LAB — HEMOGLOBIN: HEMOGLOBIN: 8.4 g/dL — AB (ref 12.0–15.0)

## 2018-01-07 LAB — CREATININE, SERUM
Creatinine, Ser: 0.89 mg/dL (ref 0.44–1.00)
GFR calc Af Amer: >60 mL/min (ref >60)
GFR calc non Af Amer: >60 mL/min (ref >60)

## 2018-01-07 LAB — POTASSIUM: POTASSIUM: 3.2 mmol/L — AB (ref 3.5–5.1)

## 2018-01-07 MED ORDER — PSYLLIUM 95 % PO PACK
1.0000 | PACK | Freq: Every day | ORAL | Status: DC
  Filled 2018-01-07 (×3): qty 1

## 2018-01-07 MED ORDER — MENTHOL 3 MG MT LOZG: 1.0000 | LOZENGE | OROMUCOSAL | Status: DC | PRN

## 2018-01-07 MED ORDER — LIP MEDEX EX OINT
1.0000 "application " | TOPICAL_OINTMENT | Freq: Two times a day (BID) | CUTANEOUS | Status: DC
  Administered 2018-01-07 – 2018-01-09 (×3): 1 via TOPICAL
  Filled 2018-01-07: qty 7

## 2018-01-07 MED ORDER — METHOCARBAMOL 1000 MG/10ML IJ SOLN: 1000.0000 mg | Freq: Four times a day (QID) | INTRAVENOUS | Status: DC | PRN

## 2018-01-07 MED ORDER — HYDROCORTISONE 2.5 % RE CREA
1.0000 "application " | TOPICAL_CREAM | Freq: Four times a day (QID) | RECTAL | Status: DC | PRN
  Filled 2018-01-07: qty 28.35

## 2018-01-07 MED ORDER — HYDROCORTISONE 1 % EX CREA
1.0000 "application " | TOPICAL_CREAM | Freq: Three times a day (TID) | CUTANEOUS | Status: DC | PRN
  Filled 2018-01-07: qty 28

## 2018-01-07 MED ORDER — TRAMADOL HCL 50 MG PO TABS: 50.0000 mg | ORAL_TABLET | Freq: Four times a day (QID) | ORAL | Status: DC | PRN

## 2018-01-07 MED ORDER — PHENOL 1.4 % MT LIQD: 1.0000 | OROMUCOSAL | Status: DC | PRN

## 2018-01-07 MED ORDER — MAGIC MOUTHWASH: 15.0000 mL | Freq: Four times a day (QID) | ORAL | Status: DC | PRN

## 2018-01-07 MED ORDER — BISACODYL 10 MG RE SUPP: 10.0000 mg | Freq: Two times a day (BID) | RECTAL | Status: DC | PRN

## 2018-01-07 MED ORDER — ALUM & MAG HYDROXIDE-SIMETH 200-200-20 MG/5ML PO SUSP: 30.0000 mL | Freq: Four times a day (QID) | ORAL | Status: DC | PRN

## 2018-01-07 MED ORDER — POTASSIUM CHLORIDE CRYS ER 20 MEQ PO TBCR
40.0000 meq | EXTENDED_RELEASE_TABLET | Freq: Two times a day (BID) | ORAL | Status: DC
  Administered 2018-01-07 – 2018-01-09 (×5): 40 meq via ORAL
  Filled 2018-01-07 (×5): qty 2

## 2018-01-07 MED ORDER — METHOCARBAMOL 500 MG PO TABS
1000.0000 mg | ORAL_TABLET | Freq: Four times a day (QID) | ORAL | Status: DC | PRN
  Administered 2018-01-07 – 2018-01-08 (×3): 1000 mg via ORAL
  Filled 2018-01-07 (×3): qty 2

## 2018-01-07 MED ORDER — METHOCARBAMOL 500 MG PO TABS: 1000.0000 mg | ORAL_TABLET | Freq: Four times a day (QID) | ORAL | Status: DC | PRN

## 2018-01-07 MED ORDER — GUAIFENESIN-DM 100-10 MG/5ML PO SYRP: 10.0000 mL | ORAL_SOLUTION | ORAL | Status: DC | PRN

## 2018-01-07 MED ORDER — METHOCARBAMOL 1000 MG/10ML IJ SOLN
1000.0000 mg | Freq: Four times a day (QID) | INTRAVENOUS | Status: DC | PRN
  Filled 2018-01-07: qty 10
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
## 2018-01-07 NOTE — Plan of Care (Signed)
?  Problem: Education: ?Goal: Knowledge of General Education information will improve ?Description: Including pain rating scale, medication(s)/side effects and non-pharmacologic comfort measures ?Outcome: Progressing ?  ?Problem: Health Behavior/Discharge Planning: ?Goal: Ability to manage health-related needs will improve ?Outcome: Progressing ?  ?Problem: Coping: ?Goal: Level of anxiety will decrease ?Outcome: Progressing ?  ?

## 2018-01-08 ENCOUNTER — Other Ambulatory Visit: Payer: Self-pay

## 2018-01-08 ENCOUNTER — Encounter (HOSPITAL_COMMUNITY): Payer: Self-pay | Admitting: General Practice

## 2018-01-08 LAB — HEMOGLOBIN: Hemoglobin: 8 g/dL — ABNORMAL LOW (ref 12.0–15.0)

## 2018-01-08 LAB — CBC
HEMATOCRIT: 26.1 % — AB (ref 36.0–46.0)
HEMOGLOBIN: 8.4 g/dL — AB (ref 12.0–15.0)
MCH: 29.9 pg (ref 26.0–34.0)
MCHC: 32.2 g/dL (ref 30.0–36.0)
MCV: 92.9 fL (ref 78.0–100.0)
Platelets: 210 10*3/uL (ref 150–400)
RBC: 2.81 MIL/uL — ABNORMAL LOW (ref 3.87–5.11)
RDW: 14.4 % (ref 11.5–15.5)
WBC: 6 10*3/uL (ref 4.0–10.5)
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
## 2018-01-08 NOTE — Plan of Care (Signed)
  Problem: Activity: Goal: Risk for activity intolerance will decrease Outcome: Progressing   Problem: Nutrition: Goal: Adequate nutrition will be maintained Outcome: Progressing   Problem: Coping: Goal: Level of anxiety will decrease Outcome: Progressing   
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
## 2018-01-08 NOTE — Plan of Care (Signed)
  Problem: Clinical Measurements: Goal: Diagnostic test results will improve Outcome: Progressing   

## 2018-01-08 NOTE — Progress Notes (Signed)
Received phone call from Meredeth Ide, [facility] case manager with [employer]:  She has been assigned to pt and is inquiring about discharge needs.  Notified her that pt will require HHPT/ RW with tentative dc date of tomorrow, 10/1.  Faxed WC case manager clinical information and orders, per her request.    Meredeth Ide, [employer] Phone:  [phone] Fax:  [phone]  Reinaldo Raddle, RN, BSN  Trauma/Neuro ICU Case Manager [phone]
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
## 2018-01-08 NOTE — Discharge Instructions (Signed)
Contusion A contusion is a deep bruise. Contusions happen when an injury causes bleeding under the skin. Symptoms of bruising include pain, swelling, and discolored skin. The skin may turn blue, purple, or yellow. Follow these instructions at home:  Rest the injured area.  If told, put ice on the injured area. ? Put ice in a plastic bag. ? Place a towel between your skin and the bag. ? Leave the ice on for 20 minutes, 2-3 times per day.  If told, put light pressure (compression) on the injured area using an elastic bandage. Make sure the bandage is not too tight. Remove it and put it back on as told by your doctor.  If possible, raise (elevate) the injured area above the level of your heart while you are sitting or lying down.  Take over-the-counter and prescription medicines only as told by your doctor. Contact a doctor if:  Your symptoms do not get better after several days of treatment.  Your symptoms get worse.  You have trouble moving the injured area. Get help right away if:  You have very bad pain.  You have a loss of feeling (numbness) in a hand or foot.  Your hand or foot turns pale or cold. This information is not intended to replace advice given to you by your health care provider. Make sure you discuss any questions you have with your health care provider. Document Released: 09/14/2007 Document Revised: 09/03/2015 Document Reviewed: 08/13/2014 Elsevier Interactive Patient Education  2018 Elsevier Inc.  

## 2018-01-09 LAB — BASIC METABOLIC PANEL
Anion gap: 6 (ref 5–15)
BUN: 9 mg/dL (ref 8–23)
CALCIUM: 8.9 mg/dL (ref 8.9–10.3)
CHLORIDE: 105 mmol/L (ref 98–111)
CO2: 26 mmol/L (ref 22–32)
CREATININE: 0.96 mg/dL (ref 0.44–1.00)
GFR calc Af Amer: >60 mL/min (ref >60)
GFR calc non Af Amer: 59 mL/min — ABNORMAL LOW (ref >60)
Glucose, Bld: 106 mg/dL — ABNORMAL HIGH (ref 70–99)
Potassium: 4 mmol/L (ref 3.5–5.1)
SODIUM: 137 mmol/L (ref 135–145)

## 2018-01-09 LAB — CBC
HCT: 26.6 % — ABNORMAL LOW (ref 36.0–46.0)
Hemoglobin: 8.4 g/dL — ABNORMAL LOW (ref 12.0–15.0)
MCH: 29.5 pg (ref 26.0–34.0)
MCHC: 31.6 g/dL (ref 30.0–36.0)
MCV: 93.3 fL (ref 78.0–100.0)
PLATELETS: 235 10*3/uL (ref 150–400)
RBC: 2.85 MIL/uL — ABNORMAL LOW (ref 3.87–5.11)
RDW: 14.5 % (ref 11.5–15.5)
WBC: 5.9 10*3/uL (ref 4.0–10.5)

## 2018-01-09 MED ORDER — ACETAMINOPHEN 325 MG PO TABS: 650.0000 mg | ORAL_TABLET | Freq: Four times a day (QID) | ORAL | Status: DC | PRN

## 2018-01-09 MED ORDER — BACITRACIN ZINC 500 UNIT/GM EX OINT: TOPICAL_OINTMENT | Freq: Two times a day (BID) | CUTANEOUS | 0 refills | Status: DC

## 2018-01-09 MED ORDER — TRAMADOL HCL 50 MG PO TABS: 50.0000 mg | ORAL_TABLET | Freq: Four times a day (QID) | ORAL | 0 refills | Status: DC | PRN

## 2018-01-09 MED ORDER — METHOCARBAMOL 500 MG PO TABS: 1000.0000 mg | ORAL_TABLET | Freq: Four times a day (QID) | ORAL | 0 refills | Status: DC | PRN
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
## 2018-03-09 IMAGING — DX DG CHEST 2V
2 series · 2 of 2 positions shown · non-contrast
Comparison: None.

CLINICAL DATA: Chest pain and pressure

EXAM:
CHEST  2 VIEW

[w chest pa]
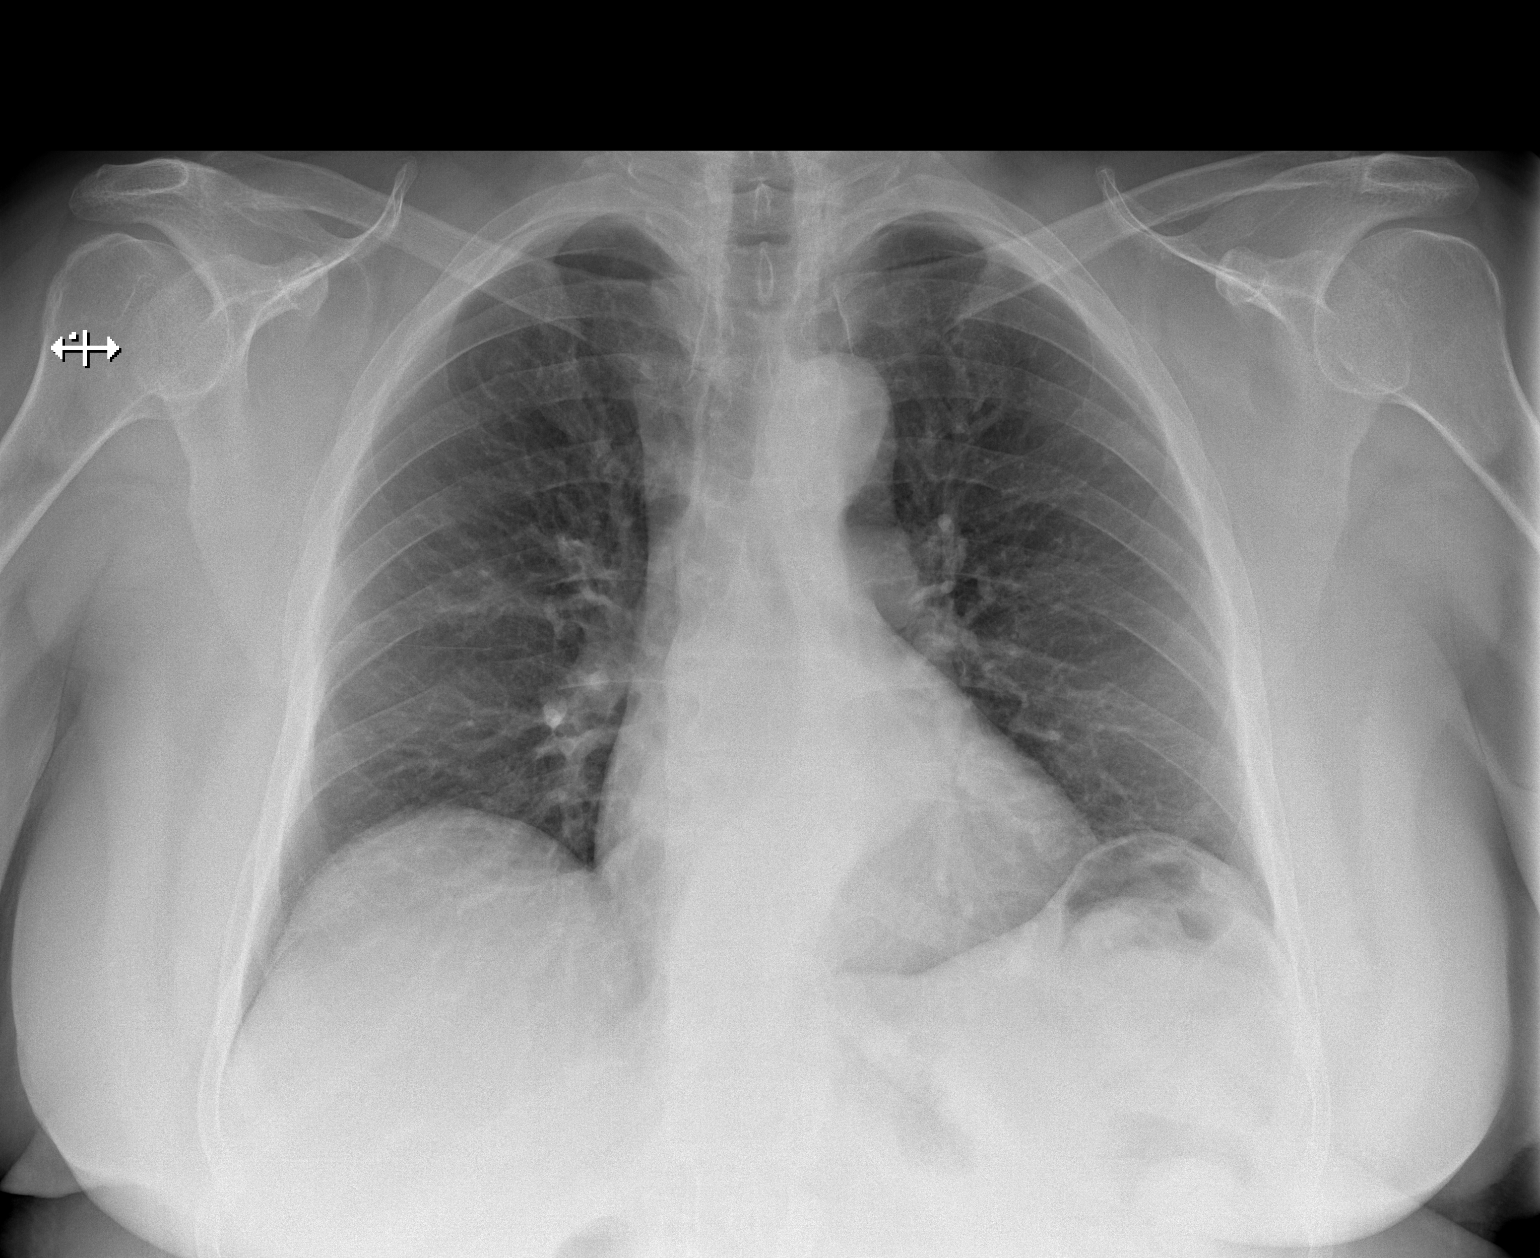

[w chest lat]
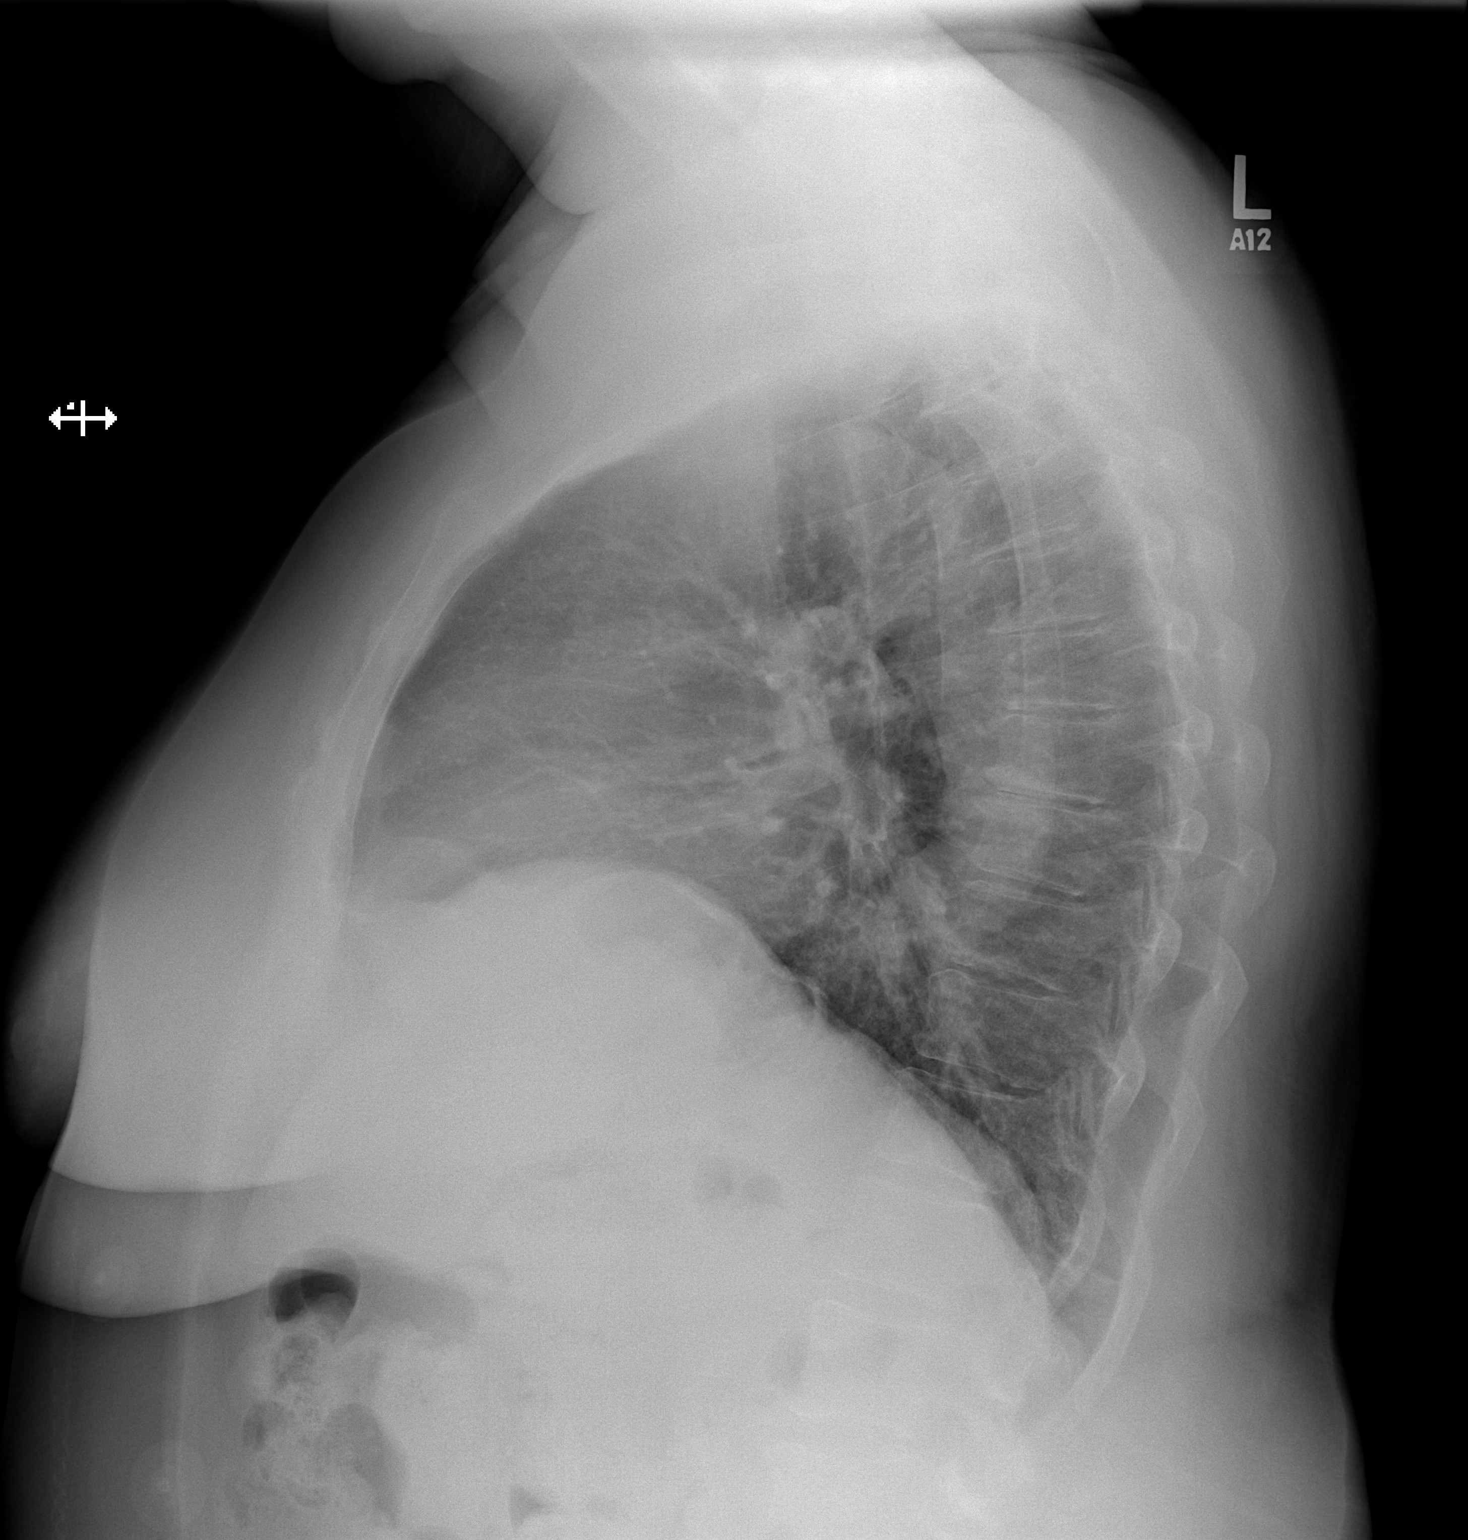

[2 of 2 positions shown; findings below may reference images not displayed]

FINDINGS: Normal mediastinum and cardiac silhouette. Normal pulmonary
vasculature. No evidence of effusion, infiltrate, or pneumothorax.
No acute bony abnormality.
IMPRESSION: No acute cardiopulmonary process.

## 2018-04-24 ENCOUNTER — Other Ambulatory Visit: Payer: Self-pay | Admitting: Internal Medicine

## 2018-04-24 DIAGNOSIS — Z1231 Encounter for screening mammogram for malignant neoplasm of breast: Secondary | ICD-10-CM

## 2018-04-25 ENCOUNTER — Ambulatory Visit
Admission: RE | Admit: 2018-04-25 | Discharge: 2018-04-25 | Disposition: A | Discharge status: Home / Self Care | Payer: 59 | Source: Ambulatory Visit | Attending: Internal Medicine | Admitting: Internal Medicine

## 2018-04-25 DIAGNOSIS — Z1231 Encounter for screening mammogram for malignant neoplasm of breast: Secondary | ICD-10-CM

## 2018-09-21 ENCOUNTER — Other Ambulatory Visit: Payer: Self-pay | Admitting: Cardiovascular Disease

## 2018-12-19 ENCOUNTER — Encounter: Payer: Self-pay | Admitting: Cardiovascular Disease

## 2018-12-19 ENCOUNTER — Encounter

## 2018-12-19 ENCOUNTER — Other Ambulatory Visit: Payer: Self-pay

## 2018-12-19 ENCOUNTER — Ambulatory Visit: Payer: 59 | Admitting: Cardiovascular Disease

## 2018-12-19 DIAGNOSIS — E785 Hyperlipidemia, unspecified: Secondary | ICD-10-CM | POA: Diagnosis not present

## 2018-12-19 DIAGNOSIS — Z9861 Coronary angioplasty status: Secondary | ICD-10-CM

## 2018-12-19 DIAGNOSIS — I10 Essential (primary) hypertension: Secondary | ICD-10-CM | POA: Diagnosis not present

## 2018-12-19 DIAGNOSIS — I2510 Atherosclerotic heart disease of native coronary artery without angina pectoris: Secondary | ICD-10-CM

## 2018-12-19 NOTE — Patient Instructions (Signed)
Medication Instructions:  Your physician recommends that you continue on your current medications as directed. Please refer to the Current Medication list given to you today.  If you need a refill on your cardiac medications before your next appointment, please call your pharmacy.   Lab work: Your physician recommends that you return for lab work within 1 week: lipid and liver panels  If you have labs (blood work) drawn today and your tests are completely normal, you will receive your results only by: [name] MyChart Message (if you have MyChart) OR . A paper copy in the mail If you have any lab test that is abnormal or we need to change your treatment, we will call you to review the results.  Testing/Procedures: none  Follow-Up: At [facility], you and your health needs are our priority.  As part of our continuing mission to provide you with exceptional heart care, we have created designated Provider Care Teams.  These Care Teams include your primary Cardiologist (physician) and Advanced Practice Providers (APPs -  Physician Assistants and Nurse Practitioners) who all work together to provide you with the care you need, when you need it. You will need a follow up appointment in 12 months with Dr. Quay Burow.  Please call our office 2 months in advance to schedule this/each appointment.

## 2018-12-19 NOTE — Assessment & Plan Note (Signed)
History of essential hypertension her blood pressure measured today at 136/88.  She is on amlodipine, losartan and metoprolol.
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
## 2018-12-19 NOTE — Assessment & Plan Note (Signed)
History of CAD status post non-STEMI 06/22/2016 with cardiac catheterization performed Dr. Burt Knack revealing a high-grade proximal LAD stenosis treated with a 4 mm Synergy drug-eluting stent postdilated to 4.5 mm.  She did have a moderately high-grade circumflex obtuse marginal branch which was tortuous and treated medically and essentially normal RCA with normal LV function.  She remains on aspirin and clopidogrel.  She gets occasional rare atypical chest pain.

## 2018-12-19 NOTE — Assessment & Plan Note (Signed)
History of dyslipidemia on high-dose statin therapy.  We will recheck a lipid liver profile. 

## 2018-12-26 LAB — HEPATIC FUNCTION PANEL
ALT: 12 IU/L (ref 0–32)
AST: 15 IU/L (ref 0–40)
Albumin: 4.4 g/dL (ref 3.7–4.7)
Alkaline Phosphatase: 98 IU/L (ref 39–117)
Bilirubin Total: 0.3 mg/dL (ref 0.0–1.2)
Bilirubin, Direct: 0.1 mg/dL (ref 0.00–0.40)
Total Protein: 6.9 g/dL (ref 6.0–8.5)

## 2018-12-26 LAB — LIPID PANEL
Chol/HDL Ratio: 2.8 ratio (ref 0.0–4.4)
Cholesterol, Total: 141 mg/dL (ref 100–199)
HDL: 51 mg/dL (ref >39)
LDL Chol Calc (NIH): 66 mg/dL (ref 0–99)
Triglycerides: 135 mg/dL (ref 0–149)
VLDL Cholesterol Cal: 24 mg/dL (ref 5–40)

## 2019-01-29 ENCOUNTER — Telehealth: Payer: Self-pay

## 2019-01-29 NOTE — Telephone Encounter (Signed)
   [facility] Pre-operative Risk Assessment    Request for surgical clearance:  1. What type of surgery is being performed? Tooth extractions  2. When is this surgery scheduled? N/A   3. What type of clearance is required (medical clearance vs. Pharmacy clearance to hold med vs. Both)? BOTH  4. Are there any medications that need to be held prior to surgery and how long? PLAVIX   5. Practice name and name of physician performing surgery? Wyline Beady, DDS, PA  6. What is your office phone number [phone]    7.   What is your office fax number [phone]  8.   Anesthesia type (None, local, MAC, general) ? N/A   Kristina [name] 01/29/2019, 4:57 PM  _________________________________________________________________   (provider comments below)

## 2019-01-30 NOTE — Telephone Encounter (Signed)
   Primary Cardiologist: Quay Burow, MD  Chart reviewed as part of pre-operative protocol coverage. Given past medical history and time since last visit, based on ACC/AHA guidelines, Devyn B Darrah would be at acceptable risk for the planned procedure without further cardiovascular testing.   Per Dr. Gwenlyn Found, it would be acceptable to hold the patient's Plavix prior to procedure and restart when safe from a surgical standpoint.  By Dr. Gwenlyn Found 12/19/2018 and was doing well from a cardiac perspective.  I will route this recommendation to the requesting party via Epic fax function and remove from pre-op pool.  Please call with questions.  Kathyrn Drown, NP 01/30/2019, 1:29 PM

## 2019-01-30 NOTE — Telephone Encounter (Signed)
Okay to hold Plavix for dental extraction

## 2019-01-30 NOTE — Telephone Encounter (Signed)
Dr. Gwenlyn Found   Can you please comment on this patients Plavix therapy? She is for teeth extractions and procedure team is asking for Plavix holding recommendations. I am sending a message to clarify hoe many teeth will be extracted.   She has a hx of non-STEMI 06/22/2016 and underwent cardiac catheterization revealing a high-grade proximal LAD stenosis treated PCI/DES. She was noted to have moderately high-grade circumflex obtuse marginal branch thats was a tortuous vessel and was treated medically. Her RCA was free of disease and she had normal LV function.   You last saw her in follow up recently on 12/19/2018.   Please send your recommendations to the pre-op pool.    Thank you  Sharee Pimple

## 2019-01-31 NOTE — Telephone Encounter (Signed)
Generally the recommendation is to hold Plavix 5-7 days prior to procedures like this.   Please lt the requesting team know.   Thank you  Sharee Pimple

## 2019-01-31 NOTE — Telephone Encounter (Signed)
Routed to Dr Durenda Age office

## 2019-01-31 NOTE — Telephone Encounter (Signed)
Received fax from Dr Lurena Joiner Johnson's office wanting to know how many days prior to extraction patient can hold Plavix   Patient having total of 8 teeth extracted   Will forward to preop pool, may have to routed to Dr Gwenlyn Found for final review

## 2019-03-12 DIAGNOSIS — U071 COVID-19: Secondary | ICD-10-CM

## 2019-03-12 HISTORY — DX: COVID-19: U07.1

## 2019-04-10 ENCOUNTER — Encounter (HOSPITAL_COMMUNITY): Payer: Self-pay | Admitting: Emergency Medicine

## 2019-04-10 ENCOUNTER — Inpatient Hospital Stay (HOSPITAL_COMMUNITY)
Admission: EM | Admit: 2019-04-10 | Discharge: 2019-04-16 | DRG: 177 | Disposition: A | Discharge status: Home / Self Care | Payer: 59 | Attending: Internal Medicine | Admitting: Internal Medicine

## 2019-04-10 ENCOUNTER — Other Ambulatory Visit: Payer: Self-pay

## 2019-04-10 ENCOUNTER — Emergency Department (HOSPITAL_COMMUNITY): Payer: 59

## 2019-04-10 DIAGNOSIS — Z823 Family history of stroke: Secondary | ICD-10-CM | POA: Diagnosis not present

## 2019-04-10 DIAGNOSIS — U071 COVID-19: Principal | ICD-10-CM | POA: Diagnosis present

## 2019-04-10 DIAGNOSIS — Z8249 Family history of ischemic heart disease and other diseases of the circulatory system: Secondary | ICD-10-CM | POA: Diagnosis not present

## 2019-04-10 DIAGNOSIS — I5032 Chronic diastolic (congestive) heart failure: Secondary | ICD-10-CM | POA: Diagnosis present

## 2019-04-10 DIAGNOSIS — Z9861 Coronary angioplasty status: Secondary | ICD-10-CM

## 2019-04-10 DIAGNOSIS — I2699 Other pulmonary embolism without acute cor pulmonale: Secondary | ICD-10-CM | POA: Diagnosis present

## 2019-04-10 DIAGNOSIS — J1282 Pneumonia due to coronavirus disease 2019: Secondary | ICD-10-CM | POA: Diagnosis present

## 2019-04-10 DIAGNOSIS — I10 Essential (primary) hypertension: Secondary | ICD-10-CM | POA: Diagnosis present

## 2019-04-10 DIAGNOSIS — I2510 Atherosclerotic heart disease of native coronary artery without angina pectoris: Secondary | ICD-10-CM | POA: Diagnosis present

## 2019-04-10 DIAGNOSIS — Z7982 Long term (current) use of aspirin: Secondary | ICD-10-CM | POA: Diagnosis not present

## 2019-04-10 DIAGNOSIS — Z7902 Long term (current) use of antithrombotics/antiplatelets: Secondary | ICD-10-CM | POA: Diagnosis not present

## 2019-04-10 DIAGNOSIS — E785 Hyperlipidemia, unspecified: Secondary | ICD-10-CM | POA: Diagnosis present

## 2019-04-10 DIAGNOSIS — R55 Syncope and collapse: Secondary | ICD-10-CM | POA: Diagnosis present

## 2019-04-10 DIAGNOSIS — I110 Hypertensive heart disease with heart failure: Secondary | ICD-10-CM | POA: Diagnosis present

## 2019-04-10 DIAGNOSIS — J189 Pneumonia, unspecified organism: Secondary | ICD-10-CM

## 2019-04-10 DIAGNOSIS — Z801 Family history of malignant neoplasm of trachea, bronchus and lung: Secondary | ICD-10-CM

## 2019-04-10 DIAGNOSIS — E876 Hypokalemia: Secondary | ICD-10-CM | POA: Diagnosis present

## 2019-04-10 DIAGNOSIS — R001 Bradycardia, unspecified: Secondary | ICD-10-CM | POA: Diagnosis not present

## 2019-04-10 HISTORY — DX: Pneumonia, unspecified organism: J18.9

## 2019-04-10 LAB — URINALYSIS, ROUTINE W REFLEX MICROSCOPIC
Bacteria, UA: NONE SEEN
Bilirubin Urine: NEGATIVE
Glucose, UA: NEGATIVE mg/dL
Hgb urine dipstick: NEGATIVE
Ketones, ur: NEGATIVE mg/dL
Nitrite: NEGATIVE
Protein, ur: NEGATIVE mg/dL
Specific Gravity, Urine: 1.014 (ref 1.005–1.030)
pH: 6 (ref 5.0–8.0)

## 2019-04-10 LAB — BASIC METABOLIC PANEL
Anion gap: 14 (ref 5–15)
BUN: 7 mg/dL — ABNORMAL LOW (ref 8–23)
CO2: 26 mmol/L (ref 22–32)
Calcium: 8.9 mg/dL (ref 8.9–10.3)
Chloride: 98 mmol/L (ref 98–111)
Creatinine, Ser: 0.99 mg/dL (ref 0.44–1.00)
GFR calc Af Amer: >60 mL/min (ref >60)
GFR calc non Af Amer: 57 mL/min — ABNORMAL LOW (ref >60)
Glucose, Bld: 112 mg/dL — ABNORMAL HIGH (ref 70–99)
Potassium: 3.2 mmol/L — ABNORMAL LOW (ref 3.5–5.1)
Sodium: 138 mmol/L (ref 135–145)

## 2019-04-10 LAB — CBC
HCT: 39.9 % (ref 36.0–46.0)
Hemoglobin: 12.6 g/dL (ref 12.0–15.0)
MCH: 29.9 pg (ref 26.0–34.0)
MCHC: 31.6 g/dL (ref 30.0–36.0)
MCV: 94.8 fL (ref 80.0–100.0)
Platelets: 220 10*3/uL (ref 150–400)
RBC: 4.21 MIL/uL (ref 3.87–5.11)
RDW: 14.1 % (ref 11.5–15.5)
WBC: 4.4 10*3/uL (ref 4.0–10.5)
nRBC: 0 % (ref 0.0–0.2)

## 2019-04-10 LAB — D-DIMER, QUANTITATIVE: D-Dimer, Quant: 2.07 ug/mL-FEU — ABNORMAL HIGH (ref 0.00–0.50)

## 2019-04-10 LAB — FIBRINOGEN: Fibrinogen: 486 mg/dL — ABNORMAL HIGH (ref 210–475)

## 2019-04-10 LAB — PROCALCITONIN: Procalcitonin: <0.1 ng/mL

## 2019-04-10 LAB — POC SARS CORONAVIRUS 2 AG -  ED: SARS Coronavirus 2 Ag: POSITIVE — AB

## 2019-04-10 LAB — LACTATE DEHYDROGENASE: LDH: 233 U/L — ABNORMAL HIGH (ref 98–192)

## 2019-04-10 LAB — BRAIN NATRIURETIC PEPTIDE: B Natriuretic Peptide: 9.7 pg/mL (ref 0.0–100.0)

## 2019-04-10 MED ORDER — IOHEXOL 350 MG/ML SOLN
100.0000 mL | Freq: Once | INTRAVENOUS | Status: AC | PRN
  Administered 2019-04-10: 100 mL via INTRAVENOUS

## 2019-04-10 MED ORDER — CLOPIDOGREL BISULFATE 75 MG PO TABS
75.0000 mg | ORAL_TABLET | Freq: Every day | ORAL | Status: DC
  Administered 2019-04-11 – 2019-04-16 (×6): 75 mg via ORAL
  Filled 2019-04-10 (×6): qty 1

## 2019-04-10 MED ORDER — NITROGLYCERIN 0.4 MG SL SUBL: 0.4000 mg | SUBLINGUAL_TABLET | SUBLINGUAL | Status: DC | PRN

## 2019-04-10 MED ORDER — ACETAMINOPHEN 325 MG PO TABS
650.0000 mg | ORAL_TABLET | Freq: Once | ORAL | Status: AC | PRN
  Administered 2019-04-10: 11:00:00 650 mg via ORAL
  Filled 2019-04-10: qty 2

## 2019-04-10 MED ORDER — METOPROLOL TARTRATE 25 MG PO TABS
25.0000 mg | ORAL_TABLET | Freq: Two times a day (BID) | ORAL | Status: DC
  Administered 2019-04-10 – 2019-04-13 (×7): 25 mg via ORAL
  Filled 2019-04-10 (×9): qty 1

## 2019-04-10 MED ORDER — HYDROCOD POLST-CPM POLST ER 10-8 MG/5ML PO SUER: 5.0000 mL | Freq: Two times a day (BID) | ORAL | Status: DC | PRN

## 2019-04-10 MED ORDER — SODIUM CHLORIDE 0.9 % IV BOLUS
1000.0000 mL | Freq: Once | INTRAVENOUS | Status: AC
  Administered 2019-04-10: 16:00:00 1000 mL via INTRAVENOUS

## 2019-04-10 MED ORDER — ONDANSETRON HCL 4 MG PO TABS: 4.0000 mg | ORAL_TABLET | Freq: Four times a day (QID) | ORAL | Status: DC | PRN

## 2019-04-10 MED ORDER — HEPARIN (PORCINE) 25000 UT/250ML-% IV SOLN
1050.0000 [IU]/h | INTRAVENOUS | Status: DC
  Administered 2019-04-11: 02:00:00 1450 [IU]/h via INTRAVENOUS
  Administered 2019-04-11: 21:00:00 1050 [IU]/h via INTRAVENOUS
  Administered 2019-04-11: 08:00:00 1450 [IU]/h via INTRAVENOUS
  Filled 2019-04-10 (×3): qty 250

## 2019-04-10 MED ORDER — VITAMIN D 25 MCG (1000 UNIT) PO TABS
5000.0000 [IU] | ORAL_TABLET | Freq: Every day | ORAL | Status: DC
  Administered 2019-04-11 – 2019-04-16 (×6): 5000 [IU] via ORAL
  Filled 2019-04-10 (×6): qty 5

## 2019-04-10 MED ORDER — FUROSEMIDE 20 MG PO TABS
20.0000 mg | ORAL_TABLET | Freq: Every day | ORAL | Status: DC
  Administered 2019-04-11 – 2019-04-16 (×6): 20 mg via ORAL
  Filled 2019-04-10 (×7): qty 1

## 2019-04-10 MED ORDER — LOSARTAN POTASSIUM 50 MG PO TABS
50.0000 mg | ORAL_TABLET | Freq: Every day | ORAL | Status: DC
  Administered 2019-04-11 – 2019-04-16 (×6): 50 mg via ORAL
  Filled 2019-04-10 (×6): qty 1

## 2019-04-10 MED ORDER — ATORVASTATIN CALCIUM 80 MG PO TABS
80.0000 mg | ORAL_TABLET | Freq: Every day | ORAL | Status: DC
  Administered 2019-04-11 – 2019-04-15 (×5): 80 mg via ORAL
  Filled 2019-04-10 (×6): qty 1

## 2019-04-10 MED ORDER — SODIUM CHLORIDE 0.9 % IV SOLN: INTRAVENOUS | Status: DC

## 2019-04-10 MED ORDER — ACETAMINOPHEN 325 MG PO TABS: 650.0000 mg | ORAL_TABLET | Freq: Four times a day (QID) | ORAL | Status: DC | PRN

## 2019-04-10 MED ORDER — ENOXAPARIN SODIUM 100 MG/ML ~~LOC~~ SOLN
95.0000 mg | Freq: Two times a day (BID) | SUBCUTANEOUS | Status: DC
  Administered 2019-04-10: 20:00:00 95 mg via SUBCUTANEOUS
  Filled 2019-04-10 (×2): qty 0.95

## 2019-04-10 MED ORDER — ASPIRIN 81 MG PO CHEW
81.0000 mg | CHEWABLE_TABLET | Freq: Every day | ORAL | Status: DC
  Administered 2019-04-11 – 2019-04-16 (×6): 81 mg via ORAL
  Filled 2019-04-10 (×5): qty 1

## 2019-04-10 MED ORDER — ONDANSETRON HCL 4 MG/2ML IJ SOLN: 4.0000 mg | Freq: Four times a day (QID) | INTRAMUSCULAR | Status: DC | PRN

## 2019-04-10 MED ORDER — DEXAMETHASONE 6 MG PO TABS
6.0000 mg | ORAL_TABLET | ORAL | Status: DC
  Administered 2019-04-11 – 2019-04-16 (×6): 6 mg via ORAL
  Filled 2019-04-10 (×6): qty 1

## 2019-04-10 MED ORDER — AMLODIPINE BESYLATE 5 MG PO TABS
5.0000 mg | ORAL_TABLET | Freq: Every day | ORAL | Status: DC
  Administered 2019-04-11 – 2019-04-13 (×3): 5 mg via ORAL
  Filled 2019-04-10 (×4): qty 1

## 2019-04-10 MED ORDER — GUAIFENESIN-DM 100-10 MG/5ML PO SYRP: 10.0000 mL | ORAL_SOLUTION | ORAL | Status: DC | PRN

## 2019-04-10 NOTE — H&P (Signed)
History and Physical   [name] [id number] DOB: [birth year] DOA: 04/10/2019  Referring MD/NP/PA: Dr. Roslynn Amble  PCP: Nolene Ebbs, MD   Outpatient Specialists: Dr. Donnella Bi cardiology  Patient coming from: Home  Chief Complaint: Shortness of breath and syncope  HPI: Kristina [name] is a 73 y.o. female with medical history significant of seizure disorder, hypertension, carpal tunnel syndrome, childhood asthma, presenting with 3 days of cough and malaise.  Patient had syncopal episode today.  She was weak in general.  Felt like she was dehydrated.  In the ER work-up showed COVID-19 positives screen.  She was also found to be afebrile with oxygen sats 88% on room air.  CT angiogram of the chest showed pulmonary embolism.  Patient is being admitted due to the combination of her PE in the setting of COVID-19 and mild dyspnea.  She is on room air now but oxygen drops when she moves around..  ED Course: Temperature 102.1 blood pressure 152/65 pulse 83 respiratory rate of 37 oxygen sat 92% on room air.  Urinalysis is negative CT angiogram of the chest showed right lower lobe segmental pulmonary embolism.  Small amount of airspace consolidation atelectasis in the left upper lobe concerning for pneumonia borderline enlarged hilar lymph node and other findings.  Patient does not appear to be hypoxic.  She has been admitted with diagnosis of COVID-19 pneumonia and pulmonary embolism.  She has LDH of 233 procalcitonin less than 0.1.  D-dimer is 2.07 and fibrinogen 486.  Review of Systems: As per HPI otherwise 10 point review of systems negative.    Past Medical History:  Diagnosis Date  . Arthritis    "right arm" (06/22/2016)  . Carpal tunnel syndrome   . Childhood asthma   . Hypertension   . Plantar fasciitis, bilateral    "had shots in them" (06/22/2016)  . Seizures ([place])    "when I was young" (06/22/2016)  . Wears glasses     Past Surgical History:  Procedure Laterality Date    . CARDIAC CATHETERIZATION    . CARPAL TUNNEL RELEASE Left 03/10/2016   Procedure: CARPAL TUNNEL RELEASE, left;  Surgeon: Daryll Brod, MD;  Location: [facility];  Service: Orthopedics;  Laterality: Left;  . COLONOSCOPY    . CORONARY STENT INTERVENTION N/A 06/23/2016   Procedure: Coronary Stent Intervention;  Surgeon: Sherren Mocha, MD;  Location: [facility];  Service: Cardiovascular;  Laterality: N/A;  . DILATION AND CURETTAGE OF UTERUS    . FRACTURE SURGERY    . LEFT HEART CATH AND CORONARY ANGIOGRAPHY N/A 06/23/2016   Procedure: Left Heart Cath and Coronary Angiography;  Surgeon: Sherren Mocha, MD;  Location: [facility];  Service: Cardiovascular;  Laterality: N/A;  . ORIF WRIST FRACTURE  10/11   left  . TRIGGER FINGER RELEASE Right 02/27/2013   Procedure: RELEASE TRIGGER RIGHT MIDDLE FINGER ;  Surgeon: Wynonia Sours, MD;  Location: [facility];  Service: Orthopedics;  Laterality: Right;  . ULNAR NERVE TRANSPOSITION Left 03/10/2016   Procedure: ULNAR NERVE DECOMPRESSION possible TRANSPOSITION;  Surgeon: Daryll Brod, MD;  Location: [facility];  Service: Orthopedics;  Laterality: Left;  [name] VAGINAL HYSTERECTOMY     "took qthing except 1 ovary"     reports that she has never smoked. She has never used smokeless tobacco. She reports current alcohol use of about 6.0 standard drinks of alcohol per week. She reports that she does not use drugs.  No Known Allergies  Family History  Problem Relation Age of Onset  . Stroke Mother 67  . Heart attack Mother 26  . Lung cancer Father   . Lung cancer Brother      Prior to Admission medications   Medication Sig Start Date End Date Taking? Authorizing Provider  amLODipine (NORVASC) 5 MG tablet Take 5 mg by mouth daily.   Yes [provider]  aspirin 81 MG chewable tablet Chew 1 tablet (81 mg total) by mouth daily. 06/25/16  Yes Dessa Phi, DO  atorvastatin (LIPITOR) 80 MG tablet  Take 1 tablet (80 mg total) by mouth daily at 6 PM. 06/24/16  Yes Dessa Phi, DO  Cholecalciferol (VITAMIN D-3 PO) Take 1 capsule by mouth daily.   Yes [provider]  clopidogrel (PLAVIX) 75 MG tablet TAKE 1 TABLET BY MOUTH  DAILY Patient taking differently: Take 75 mg by mouth daily.  09/24/18  Yes [name], MD  furosemide (LASIX) 20 MG tablet Take 20 mg by mouth daily.  10/09/14  Yes [provider]  losartan (COZAAR) 50 MG tablet Take 50 mg by mouth daily.   Yes [provider]  metoprolol tartrate (LOPRESSOR) 25 MG tablet Take 0.5 tablets (12.5 mg total) by mouth 2 (two) times daily. Patient taking differently: Take 25 mg by mouth 2 (two) times daily.  06/24/16  Yes Dessa Phi, DO  nitroGLYCERIN (NITROSTAT) 0.4 MG SL tablet Place 1 tablet (0.4 mg total) under the tongue every 5 (five) minutes as needed for chest pain. 07/12/16 04/10/19 Yes Kilroy, Doreene Burke, PA-C  acetaminophen (TYLENOL) 325 MG tablet Take 2 tablets (650 mg total) by mouth every 6 (six) hours as needed for mild pain (for pain). Patient not taking: Reported on 04/10/2019 01/09/18   Rayburn, Claiborne Billings A, PA-C  bacitracin ointment Apply topically 2 (two) times daily. Patient not taking: Reported on 04/10/2019 01/09/18   Rayburn, Claiborne Billings A, PA-C  methocarbamol (ROBAXIN) 500 MG tablet Take 2 tablets (1,000 mg total) by mouth every 6 (six) hours as needed for muscle spasms. Patient not taking: Reported on 04/10/2019 01/09/18   Lorette Ang    Physical Exam: Vitals:   04/10/19 1815 04/10/19 1830 04/10/19 1845 04/10/19 1900  BP: (!) 139/92 (!) 152/65 (!) 146/76   Pulse: 77 66 74   Resp: (!) 24 20 (!) 23   Temp:      TempSrc:      SpO2: 94% 95% 95%   Weight:    93 kg  Height:    5\' 6"  (1.676 m)      Constitutional: Anxious, NAD Vitals:   04/10/19 1815 04/10/19 1830 04/10/19 1845 04/10/19 1900  BP: (!) 139/92 (!) 152/65 (!) 146/76   Pulse: 77 66 74   Resp: (!) 24 20 (!) 23   Temp:       TempSrc:      SpO2: 94% 95% 95%   Weight:    93 kg  Height:    5\' 6"  (1.676 m)   Eyes: PERRL, lids and conjunctivae normal ENMT: Mucous membranes are moist. Posterior pharynx clear of any exudate or lesions.Normal dentition.  Neck: normal, supple, no masses, no thyromegaly Respiratory: Decreased AE Bilaterally with mild exp Wheeeze , no crackles. Normal respiratory effort. No accessory muscle use.  Cardiovascular: Regular rate and rhythm, no murmurs / rubs / gallops. No extremity edema. 2+ pedal pulses. No carotid bruits.  Abdomen: no tenderness, no masses palpated. No hepatosplenomegaly. Bowel sounds positive.  Musculoskeletal: no clubbing /  cyanosis. No joint deformity upper and lower extremities. Good ROM, no contractures. Normal muscle tone.  Skin: no rashes, lesions, ulcers. No induration Neurologic: CN 2-12 grossly intact. Sensation intact, DTR normal. Strength 5/5 in all 4.  Psychiatric: Normal judgment and insight. Alert and oriented x 3. Normal mood.     Labs on Admission: I have personally reviewed following labs and imaging studies  CBC: Recent Labs  Lab 04/10/19 1038  WBC 4.4  HGB 12.6  HCT 39.9  MCV 94.8  PLT [id number]   Basic Metabolic Panel: Recent Labs  Lab 04/10/19 1038  NA 138  K 3.2*  CL 98  CO2 26  GLUCOSE 112*  BUN 7*  CREATININE 0.99  CALCIUM 8.9   GFR: Estimated Creatinine Clearance: 59.9 mL/min (by C-G formula based on SCr of 0.99 mg/dL). Liver Function Tests: No results for input(s): AST, ALT, ALKPHOS, BILITOT, PROT, ALBUMIN in the last 168 hours. No results for input(s): LIPASE, AMYLASE in the last 168 hours. No results for input(s): AMMONIA in the last 168 hours. Coagulation Profile: No results for input(s): INR, PROTIME in the last 168 hours. Cardiac Enzymes: No results for input(s): CKTOTAL, CKMB, CKMBINDEX, TROPONINI in the last 168 hours. BNP (last 3 results) No results for input(s): PROBNP in the last 8760 hours. HbA1C: No  results for input(s): HGBA1C in the last 72 hours. CBG: No results for input(s): GLUCAP in the last 168 hours. Lipid Profile: No results for input(s): CHOL, HDL, LDLCALC, TRIG, CHOLHDL, LDLDIRECT in the last 72 hours. Thyroid Function Tests: No results for input(s): TSH, T4TOTAL, FREET4, T3FREE, THYROIDAB in the last 72 hours. Anemia Panel: No results for input(s): VITAMINB12, FOLATE, FERRITIN, TIBC, IRON, RETICCTPCT in the last 72 hours. Urine analysis:    Component Value Date/Time   COLORURINE YELLOW 04/10/2019 [place] 04/10/2019 1735   LABSPEC 1.014 04/10/2019 1735   PHURINE 6.0 04/10/2019 1735   GLUCOSEU NEGATIVE 04/10/2019 1735   HGBUR NEGATIVE 04/10/2019 1735   BILIRUBINUR NEGATIVE 04/10/2019 1735   [place] 04/10/2019 1735   PROTEINUR NEGATIVE 04/10/2019 1735   UROBILINOGEN 0.2 03/26/2011 0922   NITRITE NEGATIVE 04/10/2019 1735   LEUKOCYTESUR TRACE (A) 04/10/2019 1735   Sepsis Labs: @LABRCNTIP (procalcitonin:4,lacticidven:4) )No results found for this or any previous visit (from the past 240 hour(s)).   Radiological Exams on Admission: CT Head Wo Contrast  Result Date: 04/10/2019 CLINICAL DATA:  73 year old female with history of recurrent syncope. EXAM: CT HEAD WITHOUT CONTRAST TECHNIQUE: Contiguous axial images were obtained from the base of the skull through the vertex without intravenous contrast. COMPARISON:  Head CT 01/04/2018. FINDINGS: Brain: No evidence of acute infarction, hemorrhage, hydrocephalus, extra-axial collection or mass lesion/mass effect. Vascular: No hyperdense vessel or unexpected calcification. Skull: Normal. Area of irregular sclerosis and lucency in the right frontal bone superior and lateral to the right orbit, stable dating back to 2012, presumably fibrous dysplasia. Sinuses/Orbits: No acute finding. Other: None. IMPRESSION: 1. No acute intracranial abnormalities. The appearance of the brain is normal. 2. Right frontal  bone fibrous dysplasia redemonstrated. Electronically Signed   By: Vinnie Langton M.D.   On: 04/10/2019 18:05   CT Angio Chest PE W and/or Wo Contrast  Result Date: 04/10/2019 CLINICAL DATA:  73 year old female with history of recurrent syncope. Weakness. Evaluate for pulmonary embolism. EXAM: CT ANGIOGRAPHY CHEST WITH CONTRAST TECHNIQUE: Multidetector CT imaging of the chest was performed using the standard protocol during bolus administration of intravenous contrast. Multiplanar CT image reconstructions and MIPs were  obtained to evaluate the vascular anatomy. CONTRAST:  196mL OMNIPAQUE IOHEXOL 350 MG/ML SOLN COMPARISON:  Chest CT 01/04/2018. FINDINGS: Cardiovascular: Small nonobstructive filling defect in a right lower lobe segmental sized pulmonary artery branch (axial images 145-149 of series 5). No other larger central or lobar sized filling defect. Heart size is normal. There is no significant pericardial fluid, thickening or pericardial calcification. There is aortic atherosclerosis, as well as atherosclerosis of the great vessels of the mediastinum and the coronary arteries, including calcified atherosclerotic plaque in the left anterior descending and right coronary arteries. Thickening calcification of the aortic valve. Mediastinum/Nodes: Mildly enlarged right hilar lymph node measuring 1.5 cm in short axis (axial image 117 of series 5). Borderline enlarged left hilar lymph node measuring 1 cm in short axis. No pathologically enlarged mediastinal lymph nodes. Esophagus is unremarkable in appearance. No axillary lymphadenopathy. Lungs/Pleura: Mild consolidation and volume loss in the left upper lobe. No pleural effusions. No suspicious appearing pulmonary nodules or masses are noted. Upper Abdomen: Unremarkable. Musculoskeletal: There are no aggressive appearing lytic or blastic lesions noted in the visualized portions of the skeleton. Review of the MIP images confirms the above findings. IMPRESSION:  1. Tiny nonobstructive filling defect in a right lower lobe segmental sized pulmonary artery branch, concerning for pulmonary embolism. However, the possibility of some beam hardening artifact in this region is not excluded. No other larger central or lobar filling defect noted. 2. Mildly enlarged right hilar lymph node measuring 1.5 cm in short axis. Borderline enlarged left hilar lymph node. These are nonspecific, but attention on follow-up chest CT is recommended in 3 months to ensure the the stability or resolution of these findings. This should preferentially be performed as a PE protocol CT scan to reassess the potential right lower lobe pulmonary embolism. 3. Small amount of airspace consolidation and atelectasis in the left upper lobe concerning for pneumonia. 4. Aortic atherosclerosis, in addition to 2 vessel coronary artery disease. Assessment for potential risk factor modification, dietary therapy or pharmacologic therapy may be warranted, if clinically indicated. 5. There are calcifications of the aortic valve. Echocardiographic correlation for evaluation of potential valvular dysfunction may be warranted if clinically indicated. Aortic Atherosclerosis (ICD10-I70.0). Electronically Signed   By: Vinnie Langton M.D.   On: 04/10/2019 18:27      Assessment/Plan Principal Problem:   Pulmonary embolism and infarction [facility]) Active Problems:   Benign essential HTN   CAD S/P percutaneous coronary angioplasty   Dyslipidemia   Hypokalemia     #1 segmental pulmonary embolism: Probably induced by her Covid 19 disease leading to hypercoagulable state.  At this point will initiate IV heparin drip.  Consider transitioning to oral Xarelto at discharge or Eliquis.  No chest pain at this point.  She is not hypoxic room air when idle.  Monitor on telemetry  #2 COVID-19 pneumonia: Patient has no significant hypoxia.  I will treat her symptomatically.  No indication for remdesivir and other COVID-19  medications.  Decadron may be tried but otherwise supportive care at this point.  #3 benign essential hypertension: Continue blood pressure control.  #4 hypokalemia: Replete potassium.  #5 coronary artery disease: Coronary artery angiography with Plavix in place.  Troponin seems to be stable.   DVT prophylaxis: Heparin drip Code Status: Full code Family Communication: No family at bedside Disposition Plan: Home Consults called: None Admission status: Inpatient  Severity of Illness: The appropriate patient status for this patient is INPATIENT. Inpatient status is judged to be reasonable and necessary in  order to provide the required intensity of service to ensure the patient's safety. The patient's presenting symptoms, physical exam findings, and initial radiographic and laboratory data in the context of their chronic comorbidities is felt to place them at high risk for further clinical deterioration. Furthermore, it is not anticipated that the patient will be medically stable for discharge from the hospital within 2 midnights of admission. The following factors support the patient status of inpatient.   " The patient's presenting symptoms include Shortness of breath. " The worrisome physical exam findings include Bilateral crackles. " The initial radiographic and laboratory data are worrisome because of PE and Pna. " The chronic co-morbidities include HTN.   * I certify that at the point of admission it is my clinical judgment that the patient will require inpatient hospital care spanning beyond 2 midnights from the point of admission due to high intensity of service, high risk for further deterioration and high frequency of surveillance required.Barbette Merino MD [facility] Pager [phone]  If 7PM-7AM, please contact night-coverage [email or web address] Password [email or web address]  04/10/2019, 9:12 PM

## 2019-04-10 NOTE — Progress Notes (Signed)
ANTICOAGULATION CONSULT NOTE - Initial Consult  Pharmacy Consult for Lovenox Indication: pulmonary embolus  No Known Allergies  Patient Measurements: Height: 5\' 6"  (167.6 cm) Weight: 205 lb 0.4 oz (93 kg) IBW/kg (Calculated) : 59.3 Heparin Dosing Weight: 93 kg  Vital Signs: Temp: 100.2 F (37.9 C) (12/30 1813) Temp Source: Oral (12/30 1813) BP: 140/70 (12/30 1811) Pulse Rate: 80 (12/30 1811)  Labs: Recent Labs    04/10/19 1038  HGB 12.6  HCT 39.9  PLT 220  CREATININE 0.99    Estimated Creatinine Clearance: 59.9 mL/min (by C-G formula based on SCr of 0.99 mg/dL).   Medical History: Past Medical History:  Diagnosis Date  . Arthritis    "right arm" (06/22/2016)  . Carpal tunnel syndrome   . Childhood asthma   . Hypertension   . Plantar fasciitis, bilateral    "had shots in them" (06/22/2016)  . Seizures ([place])    "when I was young" (06/22/2016)  . Wears glasses     Medications:  Scheduled:  . enoxaparin (LOVENOX) injection  95 mg Subcutaneous Q12H    Assessment: Patient is a 39 yof that presents to the ED after a syncopal episode. The patient was found to have a PE on further workup. Pharmacy has been asked to dose lovenox in this patient.   Goal of Therapy:  Anti-Xa level 0.6-1 units/ml 4hrs after LMWH dose given Monitor platelets by anticoagulation protocol: Yes   Plan:  - Will dose Lovenox 95 mg subQ q12h  - Monitor renal function and cbc - Monitor patient for s/s of bleeding  - Consider checking Xa level if plans to d/c patient on Lovenox   Duanne Limerick PharmD. BCPS  04/10/2019,7:03 PM

## 2019-04-10 NOTE — ED Provider Notes (Signed)
73 year old lady presents to ER with syncope, cough, malaise.  Patient noted to be febrile, found to be Covid positive.  Vitals stable, on room air, noted 88% on ambulation trial.  Labs thus far within normal limits.  Plan to check CT head as well as CTA chest to rule out PE.  4:38 PM Received signout from Dr. Tyrone Nine, plan to follow-up on CTs and admit patient for further Covid care and syncope work up  6:41 PM CTA chest with concern for likely acute pulmonary embolism.  She is hypercoagulable given Covid.  Placed order for consult pharmacy for Lovenox, placed consult for hospitalist service for admission, placed additional preadmission orders labs for Covid admits.  Will update patient.   Lucrezia Starch, MD 04/10/19 [phone]

## 2019-04-10 NOTE — ED Triage Notes (Signed)
Per [facility] pt coming from home c/o malaise x 3 days. Tried OTC cough/flu medicine. Sometime last night/early morning patient found herself on the ground in the bathroom diaphoretic. States she does not remember walking in there.

## 2019-04-10 NOTE — ED Notes (Signed)
Pt O2 pulse ox stayed at 97 while ambulating. Dropped to 88 momentarily, but came back up.
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
## 2019-04-10 NOTE — ED Notes (Signed)
Back from CT

## 2019-04-10 NOTE — ED Notes (Signed)
Pt placed on hospital bed for comfort.  Pt was able to get up and sit in a chair w/ standby assistance.

## 2019-04-10 NOTE — ED Notes (Signed)
Pt states is hungry

## 2019-04-11 ENCOUNTER — Inpatient Hospital Stay (HOSPITAL_COMMUNITY): Payer: 59

## 2019-04-11 DIAGNOSIS — I10 Essential (primary) hypertension: Secondary | ICD-10-CM

## 2019-04-11 DIAGNOSIS — I2510 Atherosclerotic heart disease of native coronary artery without angina pectoris: Secondary | ICD-10-CM

## 2019-04-11 DIAGNOSIS — Z9861 Coronary angioplasty status: Secondary | ICD-10-CM

## 2019-04-11 DIAGNOSIS — U071 COVID-19: Principal | ICD-10-CM

## 2019-04-11 LAB — TRIGLYCERIDES: Triglycerides: 82 mg/dL (ref <150)

## 2019-04-11 LAB — CBC WITH DIFFERENTIAL/PLATELET
Abs Immature Granulocytes: 0.01 10*3/uL (ref 0.00–0.07)
Basophils Absolute: 0 10*3/uL (ref 0.0–0.1)
Basophils Relative: 0 %
Eosinophils Absolute: 0 10*3/uL (ref 0.0–0.5)
Eosinophils Relative: 0 %
HCT: 36.9 % (ref 36.0–46.0)
Hemoglobin: 12.1 g/dL (ref 12.0–15.0)
Immature Granulocytes: 0 %
Lymphocytes Relative: 40 %
Lymphs Abs: 1.6 10*3/uL (ref 0.7–4.0)
MCH: 30.5 pg (ref 26.0–34.0)
MCHC: 32.8 g/dL (ref 30.0–36.0)
MCV: 92.9 fL (ref 80.0–100.0)
Monocytes Absolute: 0.4 10*3/uL (ref 0.1–1.0)
Monocytes Relative: 10 %
Neutro Abs: 2 10*3/uL (ref 1.7–7.7)
Neutrophils Relative %: 50 %
Platelets: 168 10*3/uL (ref 150–400)
RBC: 3.97 MIL/uL (ref 3.87–5.11)
RDW: 14.3 % (ref 11.5–15.5)
WBC: 4 10*3/uL (ref 4.0–10.5)
nRBC: 0 % (ref 0.0–0.2)

## 2019-04-11 LAB — COMPREHENSIVE METABOLIC PANEL
ALT: 12 U/L (ref 0–44)
AST: 18 U/L (ref 15–41)
Albumin: 3.2 g/dL — ABNORMAL LOW (ref 3.5–5.0)
Alkaline Phosphatase: 67 U/L (ref 38–126)
Anion gap: 10 (ref 5–15)
BUN: 8 mg/dL (ref 8–23)
CO2: 26 mmol/L (ref 22–32)
Calcium: 8.4 mg/dL — ABNORMAL LOW (ref 8.9–10.3)
Chloride: 102 mmol/L (ref 98–111)
Creatinine, Ser: 1.03 mg/dL — ABNORMAL HIGH (ref 0.44–1.00)
GFR calc Af Amer: >60 mL/min (ref >60)
GFR calc non Af Amer: 55 mL/min — ABNORMAL LOW (ref >60)
Glucose, Bld: 129 mg/dL — ABNORMAL HIGH (ref 70–99)
Potassium: 3 mmol/L — ABNORMAL LOW (ref 3.5–5.1)
Sodium: 138 mmol/L (ref 135–145)
Total Bilirubin: 0.5 mg/dL (ref 0.3–1.2)
Total Protein: 6.2 g/dL — ABNORMAL LOW (ref 6.5–8.1)

## 2019-04-11 LAB — D-DIMER, QUANTITATIVE: D-Dimer, Quant: 1.24 ug/mL-FEU — ABNORMAL HIGH (ref 0.00–0.50)

## 2019-04-11 LAB — C-REACTIVE PROTEIN: CRP: 3.1 mg/dL — ABNORMAL HIGH (ref <1.0)

## 2019-04-11 LAB — HEPARIN LEVEL (UNFRACTIONATED)
Heparin Unfractionated: 1.04 IU/mL — ABNORMAL HIGH (ref 0.30–0.70)
Heparin Unfractionated: 1.04 IU/mL — ABNORMAL HIGH (ref 0.30–0.70)

## 2019-04-11 LAB — MAGNESIUM: Magnesium: 2 mg/dL (ref 1.7–2.4)

## 2019-04-11 LAB — FERRITIN: Ferritin: 40 ng/mL (ref 11–307)

## 2019-04-11 LAB — TROPONIN I (HIGH SENSITIVITY): Troponin I (High Sensitivity): 10 ng/L (ref <18)

## 2019-04-11 MED ORDER — POTASSIUM CHLORIDE 10 MEQ/100ML IV SOLN
10.0000 meq | INTRAVENOUS | Status: AC
  Administered 2019-04-11 (×3): 10 meq via INTRAVENOUS
  Filled 2019-04-11 (×3): qty 100

## 2019-04-11 NOTE — Progress Notes (Signed)
[facility] for heparin Indication: pulmonary embolus  No Known Allergies  Patient Measurements: Height: 5\' 6"  (167.6 cm) Weight: 205 lb 0.4 oz (93 kg) IBW/kg (Calculated) : 59.3 Heparin Dosing Weight: 93 kg  Vital Signs: Temp: 99.3 F (37.4 C) (12/31 0852) Temp Source: Oral (12/31 0852) BP: 131/74 (12/31 0516) Pulse Rate: 78 (12/31 0516)  Labs: Recent Labs    04/10/19 1038 04/11/19 0624 04/11/19 0931  HGB 12.6 12.1  --   HCT 39.9 36.9  --   PLT 220 168  --   HEPARINUNFRC  --   --  1.04*  CREATININE 0.99 1.03*  --   TROPONINIHS  --  10  --     Estimated Creatinine Clearance: 57.6 mL/min (A) (by C-G formula based on SCr of 1.03 mg/dL (H)).   Medical History: Past Medical History:  Diagnosis Date  . Arthritis    "right arm" (06/22/2016)  . Carpal tunnel syndrome   . Childhood asthma   . Hypertension   . Plantar fasciitis, bilateral    "had shots in them" (06/22/2016)  . Seizures ([place])    "when I was young" (06/22/2016)  . Wears glasses     Medications:  Scheduled:  . amLODipine  5 mg Oral Daily  . aspirin  81 mg Oral Daily  . atorvastatin  80 mg Oral q1800  . cholecalciferol  5,000 Units Oral Daily  . clopidogrel  75 mg Oral Daily  . dexamethasone  6 mg Oral Q24H  . furosemide  20 mg Oral Daily  . losartan  50 mg Oral Daily  . metoprolol tartrate  25 mg Oral BID    Assessment: Patient is a 73 yof that presents to the ED after a syncopal episode. The patient was found to have a PE on further workup. Pharmacy has been asked to dose lovenox in this patient.   When admitted pharmacy was consulted to switch from lovenox to heparin gtt for unknown reasons. Initial heparin level supratherapeutic on 1450 units/hr  Goal of Therapy:  Heparin level 0.3-0.7 units/ml Monitor platelets by anticoagulation protocol: Yes   Plan:  Decrease heparin gtt to 1200 units/hr F/u 8 hour heparin level F/u transition to [place], PharmD Clinical Pharmacist Please check AMION for all [facility] numbers 04/11/2019 11:46 AM

## 2019-04-11 NOTE — Progress Notes (Signed)
ANTICOAGULATION CONSULT NOTE   Pharmacy Consult for heparin Indication: pulmonary embolus  No Known Allergies  Patient Measurements: Height: 5\' 6"  (167.6 cm) Weight: 205 lb 0.4 oz (93 kg) IBW/kg (Calculated) : 59.3 Heparin Dosing Weight: 93 kg  Vital Signs: Temp: 99.2 F (37.3 C) (12/31 1356) Temp Source: Oral (12/31 1356) BP: 133/71 (12/31 1356) Pulse Rate: 86 (12/31 1356)  Labs: Recent Labs    04/10/19 1038 04/11/19 0624 04/11/19 0931 04/11/19 1930  HGB 12.6 12.1  --   --   HCT 39.9 36.9  --   --   PLT 220 168  --   --   HEPARINUNFRC  --   --  1.04* 1.04*  CREATININE 0.99 1.03*  --   --   TROPONINIHS  --  10  --   --     Estimated Creatinine Clearance: 57.6 mL/min (A) (by C-G formula based on SCr of 1.03 mg/dL (H)).   Medical History: Past Medical History:  Diagnosis Date  . Arthritis    "right arm" (06/22/2016)  . Carpal tunnel syndrome   . Childhood asthma   . Hypertension   . Plantar fasciitis, bilateral    "had shots in them" (06/22/2016)  . Seizures ([place])    "when I was young" (06/22/2016)  . Wears glasses     Medications:  Scheduled:  . amLODipine  5 mg Oral Daily  . aspirin  81 mg Oral Daily  . atorvastatin  80 mg Oral q1800  . cholecalciferol  5,000 Units Oral Daily  . clopidogrel  75 mg Oral Daily  . dexamethasone  6 mg Oral Q24H  . furosemide  20 mg Oral Daily  . losartan  50 mg Oral Daily  . metoprolol tartrate  25 mg Oral BID    Assessment: Patient is a 44 yof that presents to the ED after a syncopal episode. The patient was found to have a PE on further workup. Pharmacy has been asked to dose lovenox in this patient.   When admitted pharmacy was consulted to switch from lovenox to heparin gtt for unknown reasons.  Heparin level remains high despite rate reduction earlier today.  RN unable to confirm where level was drawn.  No overt bleeding or complications noted.  Did have 1mg /kg dose of lovenox about 24 hrs ago which may be  elevating heparin levels.  Goal of Therapy:  Heparin level 0.3-0.7 units/ml Monitor platelets by anticoagulation protocol: Yes   Plan:  Decrease heparin gtt to 1050 units/hr F/u 8 hour heparin level F/u transition to DOAC  Marguerite Olea, [facility] Clinical Pharmacist Phone [phone]  04/11/2019 8:30 PM

## 2019-04-11 NOTE — Progress Notes (Addendum)
[facility]  PROGRESS NOTE  Kristina [name] [id number] DOB: [birth year] DOA: 04/10/2019 PCP: Nolene Ebbs, MD   Brief HPI:   73 year old female with history of seizure disorder, hypertension, carpal tunnel syndrome, childhood asthma presents with 3 days history of cough and malaise.  COVID-19 was positive in the ED.  CTA of the chest showed pulmonary embolism.  Patient admitted with diagnosis of PE in the setting of COVID-19 and mild dyspnea. Inflammatory markers showed LDH 233, procalcitonin less than 0.1, D-dimer 2.07, fibrinogen 486.  Chest x-ray showed no pneumonia so she was not started on remdesivir or Decadron.  Patient is currently not requiring oxygen.    Subjective   Patient seen and examined, denies shortness of breath.  Currently on IV heparin therapy.   Assessment/Plan:     1. Segmental pulmonary embolism-CT chest shows tiny nonobstructive filling defect in the right lower lobe, segmental size pulmonary artery branch concerning for pulmonary embolism.  Patient started on heparin.  She is currently not requiring oxygen.  2. Enlarged right hilar lymph node-seen on CT chest.  Lymph node measures 1.5 cm in short axis.  Follow-up CT recommended in 3 months to ensure the stability and resolution of the findings.  3. COVID-19 infection-patient has no respiratory symptoms of chest pain or dyspnea.  She is not requiring oxygen.  Remdesivir and Decadron has not been started.  We will continue to monitor patient.  She will be discharged home without remdesivir or Decadron if she continues to remain stable.  CRP is 3.1, ferritin is 40 triglycerides 82.  Patient was febrile yesterday, she still has low-grade fever.  Will repeat chest x-ray and consider starting remdesivir and Decadron.  4. Hypokalemia-potassium was 3.0, will replace potassium.  Check serum magnesium.  5. Coronary artery disease-stable, continue Plavix.  6. Hypertension-blood pressure is stable, continue  metoprolol, losartan, amlodipine.     SpO2: 99 % on room air  COVID-19 Labs  Recent Labs    04/10/19 2001 04/11/19 0624  DDIMER 2.07* 1.24*  FERRITIN  --  40  LDH 233*  --   CRP  --  3.1*    No results found for: SARSCOV2NAA   CBG: No results for input(s): GLUCAP in the last 168 hours.  CBC: Recent Labs  Lab 04/10/19 1038 04/11/19 0624  WBC 4.4 4.0  NEUTROABS  --  2.0  HGB 12.6 12.1  HCT 39.9 36.9  MCV 94.8 92.9  PLT 220 [id number]    Basic Metabolic Panel: Recent Labs  Lab 04/10/19 1038 04/11/19 0624  NA 138 138  K 3.2* 3.0*  CL 98 102  CO2 26 26  GLUCOSE 112* 129*  BUN 7* 8  CREATININE 0.99 1.03*  CALCIUM 8.9 8.4*     Liver Function Tests: Recent Labs  Lab 04/11/19 0624  AST 18  ALT 12  ALKPHOS 67  BILITOT 0.5  PROT 6.2*  ALBUMIN 3.2*        DVT prophylaxis: Heparin therapy  Code Status: Full code  Family Communication: No family at bedside  Disposition Plan: likely home when medically ready for discharge       Scheduled medications:  . amLODipine  5 mg Oral Daily  . aspirin  81 mg Oral Daily  . atorvastatin  80 mg Oral q1800  . cholecalciferol  5,000 Units Oral Daily  . clopidogrel  75 mg Oral Daily  . dexamethasone  6 mg Oral Q24H  . furosemide  20 mg Oral Daily  . losartan  50  mg Oral Daily  . metoprolol tartrate  25 mg Oral BID    Consultants:  None  Procedures:    Antibiotics:   Anti-infectives (From admission, onward)   None       Objective   Vitals:   04/11/19 0100 04/11/19 0130 04/11/19 0516 04/11/19 0852  BP: 131/81 (!) 141/77 131/74   Pulse: 79 69 78   Resp: 12 (!) 21    Temp:   100 F (37.8 C) 99.3 F (37.4 C)  TempSrc:   Oral Oral  SpO2: 93% 92% 99%   Weight:      Height:        Intake/Output Summary (Last 24 hours) at 04/11/2019 1107 Last data filed at 04/11/2019 [id number] Gross per 24 hour  Intake 2151.39 ml  Output --  Net 2151.39 ml    12/29 1901 - 12/31 0700 In: 1000  Out: -    Filed Weights   04/10/19 1900  Weight: 93 kg    Physical Examination:    General: Appears in no acute distress  Cardiovascular: S1-S2, regular, no murmur auscultated  Respiratory: Clear to auscultation bilaterally, no wheezing or crackles  Abdomen: Abdomen is soft, nontender, no organomegaly  Extremities: No edema in the lower extremities  Neurologic: Cranial nerves II through XII grossly intact, no focal deficit noted     Cardiac Enzymes: No results for input(s): CKTOTAL, CKMB, CKMBINDEX, TROPONINI in the last 168 hours. BNP (last 3 results) Recent Labs    04/10/19 1500  BNP 9.7    ProBNP (last 3 results) No results for input(s): PROBNP in the last 8760 hours.  Studies:  CT Head Wo Contrast  Result Date: 04/10/2019 CLINICAL DATA:  73 year old female with history of recurrent syncope. EXAM: CT HEAD WITHOUT CONTRAST TECHNIQUE: Contiguous axial images were obtained from the base of the skull through the vertex without intravenous contrast. COMPARISON:  Head CT 01/04/2018. FINDINGS: Brain: No evidence of acute infarction, hemorrhage, hydrocephalus, extra-axial collection or mass lesion/mass effect. Vascular: No hyperdense vessel or unexpected calcification. Skull: Normal. Area of irregular sclerosis and lucency in the right frontal bone superior and lateral to the right orbit, stable dating back to 2012, presumably fibrous dysplasia. Sinuses/Orbits: No acute finding. Other: None. IMPRESSION: 1. No acute intracranial abnormalities. The appearance of the brain is normal. 2. Right frontal bone fibrous dysplasia redemonstrated. Electronically Signed   By: Vinnie Langton M.D.   On: 04/10/2019 18:05   CT Angio Chest PE W and/or Wo Contrast  Result Date: 04/10/2019 CLINICAL DATA:  73 year old female with history of recurrent syncope. Weakness. Evaluate for pulmonary embolism. EXAM: CT ANGIOGRAPHY CHEST WITH CONTRAST TECHNIQUE: Multidetector CT imaging of the chest was  performed using the standard protocol during bolus administration of intravenous contrast. Multiplanar CT image reconstructions and MIPs were obtained to evaluate the vascular anatomy. CONTRAST:  168mL OMNIPAQUE IOHEXOL 350 MG/ML SOLN COMPARISON:  Chest CT 01/04/2018. FINDINGS: Cardiovascular: Small nonobstructive filling defect in a right lower lobe segmental sized pulmonary artery branch (axial images 145-149 of series 5). No other larger central or lobar sized filling defect. Heart size is normal. There is no significant pericardial fluid, thickening or pericardial calcification. There is aortic atherosclerosis, as well as atherosclerosis of the great vessels of the mediastinum and the coronary arteries, including calcified atherosclerotic plaque in the left anterior descending and right coronary arteries. Thickening calcification of the aortic valve. Mediastinum/Nodes: Mildly enlarged right hilar lymph node measuring 1.5 cm in short axis (axial image 117 of series 5). Borderline  enlarged left hilar lymph node measuring 1 cm in short axis. No pathologically enlarged mediastinal lymph nodes. Esophagus is unremarkable in appearance. No axillary lymphadenopathy. Lungs/Pleura: Mild consolidation and volume loss in the left upper lobe. No pleural effusions. No suspicious appearing pulmonary nodules or masses are noted. Upper Abdomen: Unremarkable. Musculoskeletal: There are no aggressive appearing lytic or blastic lesions noted in the visualized portions of the skeleton. Review of the MIP images confirms the above findings. IMPRESSION: 1. Tiny nonobstructive filling defect in a right lower lobe segmental sized pulmonary artery branch, concerning for pulmonary embolism. However, the possibility of some beam hardening artifact in this region is not excluded. No other larger central or lobar filling defect noted. 2. Mildly enlarged right hilar lymph node measuring 1.5 cm in short axis. Borderline enlarged left hilar  lymph node. These are nonspecific, but attention on follow-up chest CT is recommended in 3 months to ensure the the stability or resolution of these findings. This should preferentially be performed as a PE protocol CT scan to reassess the potential right lower lobe pulmonary embolism. 3. Small amount of airspace consolidation and atelectasis in the left upper lobe concerning for pneumonia. 4. Aortic atherosclerosis, in addition to 2 vessel coronary artery disease. Assessment for potential risk factor modification, dietary therapy or pharmacologic therapy may be warranted, if clinically indicated. 5. There are calcifications of the aortic valve. Echocardiographic correlation for evaluation of potential valvular dysfunction may be warranted if clinically indicated. Aortic Atherosclerosis (ICD10-I70.0). Electronically Signed   By: Vinnie Langton M.D.   On: 04/10/2019 18:27     Admission status: Inpatient: Based on patients clinical presentation and evaluation of above clinical data, I have made determination that patient meets Inpatient criteria at this time.   Oswald Hillock   [facility] If 7PM-7AM, please contact night-coverage at [email or web address], Office  [phone]  password [email or web address]  04/11/2019, 11:07 AM  LOS: 1 day

## 2019-04-11 NOTE — Progress Notes (Signed)
LENAE BODOH [id number] Admission Data: 04/11/2019 2:58 AM Attending Provider: Elwyn Reach, MD  [id number], Christean Grief, MD Consults/ Treatment Team:   Kristina [name] is a 73 y.o. female patient admitted from ED awake, alert  & orientated  X 3,  Full Code, VSS - Blood pressure (!) 141/77, pulse 69, temperature 100.2 F (37.9 C), temperature source Oral, resp. rate (!) 21, height 5\' 6"  (1.676 m), weight 93 kg, SpO2 92 %., room air, no c/o shortness of breath, no c/o chest pain, no distress noted. Tele # [id number] placed and pt is currently running:normal sinus rhythm.   IV site WDL:  antecubital right, condition patent and no redness with a transparent dsg that's clean dry and intact.  Allergies:  No Known Allergies   Past Medical History:  Diagnosis Date  . Arthritis    "right arm" (06/22/2016)  . Carpal tunnel syndrome   . Childhood asthma   . Hypertension   . Plantar fasciitis, bilateral    "had shots in them" (06/22/2016)  . Seizures ([place])    "when I was young" (06/22/2016)  . Wears glasses    Pt orientation to unit, room and routine. Information packet given to patient/family and safety video watched.  Admission INP armband ID verified with patient/family, and in place. SR up x 2, fall risk assessment complete with Patient and family verbalizing understanding of risks associated with falls. Pt verbalizes an understanding of how to use the call bell and to call for help before getting out of bed.  Skin, clean-dry- intact without evidence of bruising, or skin tears.   No evidence of skin break down noted on exam. no rashes, no ecchymoses, no wounds   Will cont to monitor and assist as needed.  Walker Shadow, RN 04/11/2019 2:58 AM

## 2019-04-12 LAB — COMPREHENSIVE METABOLIC PANEL
ALT: 13 U/L (ref 0–44)
AST: 15 U/L (ref 15–41)
Albumin: 3.1 g/dL — ABNORMAL LOW (ref 3.5–5.0)
Alkaline Phosphatase: 60 U/L (ref 38–126)
Anion gap: 9 (ref 5–15)
BUN: 10 mg/dL (ref 8–23)
CO2: 26 mmol/L (ref 22–32)
Calcium: 8.8 mg/dL — ABNORMAL LOW (ref 8.9–10.3)
Chloride: 107 mmol/L (ref 98–111)
Creatinine, Ser: 0.82 mg/dL (ref 0.44–1.00)
GFR calc Af Amer: >60 mL/min (ref >60)
GFR calc non Af Amer: >60 mL/min (ref >60)
Glucose, Bld: 116 mg/dL — ABNORMAL HIGH (ref 70–99)
Potassium: 3.7 mmol/L (ref 3.5–5.1)
Sodium: 142 mmol/L (ref 135–145)
Total Bilirubin: 0.6 mg/dL (ref 0.3–1.2)
Total Protein: 6 g/dL — ABNORMAL LOW (ref 6.5–8.1)

## 2019-04-12 LAB — CBC WITH DIFFERENTIAL/PLATELET
Abs Immature Granulocytes: 0.01 10*3/uL (ref 0.00–0.07)
Basophils Absolute: 0 10*3/uL (ref 0.0–0.1)
Basophils Relative: 0 %
Eosinophils Absolute: 0 10*3/uL (ref 0.0–0.5)
Eosinophils Relative: 0 %
HCT: 34.4 % — ABNORMAL LOW (ref 36.0–46.0)
Hemoglobin: 11.1 g/dL — ABNORMAL LOW (ref 12.0–15.0)
Immature Granulocytes: 0 %
Lymphocytes Relative: 38 %
Lymphs Abs: 1.3 10*3/uL (ref 0.7–4.0)
MCH: 29.9 pg (ref 26.0–34.0)
MCHC: 32.3 g/dL (ref 30.0–36.0)
MCV: 92.7 fL (ref 80.0–100.0)
Monocytes Absolute: 0.4 10*3/uL (ref 0.1–1.0)
Monocytes Relative: 11 %
Neutro Abs: 1.7 10*3/uL (ref 1.7–7.7)
Neutrophils Relative %: 51 %
Platelets: 178 10*3/uL (ref 150–400)
RBC: 3.71 MIL/uL — ABNORMAL LOW (ref 3.87–5.11)
RDW: 14.2 % (ref 11.5–15.5)
WBC: 3.3 10*3/uL — ABNORMAL LOW (ref 4.0–10.5)
nRBC: 0 % (ref 0.0–0.2)

## 2019-04-12 LAB — HEPARIN LEVEL (UNFRACTIONATED)
Heparin Unfractionated: 1.05 IU/mL — ABNORMAL HIGH (ref 0.30–0.70)
Heparin Unfractionated: 0.67 IU/mL (ref 0.30–0.70)
Heparin Unfractionated: 0.24 IU/mL — ABNORMAL LOW (ref 0.30–0.70)

## 2019-04-12 LAB — D-DIMER, QUANTITATIVE: D-Dimer, Quant: 1.06 ug/mL-FEU — ABNORMAL HIGH (ref 0.00–0.50)

## 2019-04-12 LAB — C-REACTIVE PROTEIN: CRP: 2.1 mg/dL — ABNORMAL HIGH (ref <1.0)

## 2019-04-12 MED ORDER — HEPARIN (PORCINE) 25000 UT/250ML-% IV SOLN
850.0000 [IU]/h | INTRAVENOUS | Status: DC
  Administered 2019-04-13: 1000 [IU]/h via INTRAVENOUS
  Filled 2019-04-12 (×2): qty 250

## 2019-04-12 MED ORDER — SODIUM CHLORIDE 0.9% FLUSH: 10.0000 mL | INTRAVENOUS | Status: DC | PRN

## 2019-04-12 MED ORDER — SODIUM CHLORIDE 0.9 % IV SOLN
100.0000 mg | Freq: Every day | INTRAVENOUS | Status: AC
  Administered 2019-04-13 – 2019-04-16 (×4): 100 mg via INTRAVENOUS
  Filled 2019-04-12 (×4): qty 20

## 2019-04-12 MED ORDER — SODIUM CHLORIDE 0.9 % IV SOLN
200.0000 mg | Freq: Once | INTRAVENOUS | Status: AC
  Administered 2019-04-12: 200 mg via INTRAVENOUS
  Filled 2019-04-12: qty 40

## 2019-04-12 MED ORDER — SODIUM CHLORIDE 0.9% FLUSH
10.0000 mL | Freq: Two times a day (BID) | INTRAVENOUS | Status: DC
  Administered 2019-04-12 – 2019-04-16 (×6): 10 mL

## 2019-04-12 NOTE — Progress Notes (Signed)
ANTICOAGULATION CONSULT NOTE - Follow-Up Pharmacy Consult for heparin Indication: pulmonary embolus  No Known Allergies  Patient Measurements: Height: 5\' 6"  (167.6 cm) Weight: 202 lb 9.6 oz (91.9 kg) IBW/kg (Calculated) : 59.3 Heparin Dosing Weight: 93 kg  Vital Signs: Temp: 98.3 F (36.8 C) (01/01 2129) Temp Source: Oral (01/01 2000) BP: 136/84 (01/01 2000) Pulse Rate: 60 (01/01 2000)  Labs: Recent Labs    04/10/19 1038 04/10/19 1038 04/11/19 0624 04/12/19 0255 04/12/19 1135 04/12/19 2205  HGB 12.6  --  12.1 11.1*  --   --   HCT 39.9  --  36.9 34.4*  --   --   PLT 220  --  168 178  --   --   HEPARINUNFRC  --    < >  --  1.05* 0.67 0.24*  CREATININE 0.99  --  1.03* 0.82  --   --   TROPONINIHS  --   --  10  --   --   --    < > = values in this interval not displayed.    Estimated Creatinine Clearance: 71.8 mL/min (by C-G formula based on SCr of 0.82 mg/dL).  Assessment: 74 y.o. female presented on 04/10/2019 with shortness of breath. Pharmacy consulted to dose heparin for PE. CTA found tiny nonobstructive filling detect in right lower lobe segmental sized pulmonary artery branch concerning for PE and mildly enlarged right hilar lymph node measuring 1.5cm with recommended follow up CT chest in 3 months to reassess the potential for right lower lobe PE. D-dimer 2.07.   Heparin level is now subtherapeutic on 850 units/hr.  Goal of Therapy:  Heparin level 0.3-0.7 units/ml Monitor platelets by anticoagulation protocol: Yes   Plan:  Increase heparin to 1000 units/hr. Would suggest Lovenox or switching to oral agent given difficulty with rate adjustments and obtaining labs in COVID + patient. Next heparin level with AM labs. Monitor heparin level, CBC, and S/S of bleeding daily   [employer], Pharm.D., BCPS Clinical Pharmacist  **Pharmacist phone directory can be found on [email or web address] listed under [place].  04/12/2019 10:56 PM

## 2019-04-12 NOTE — Progress Notes (Signed)
ANTICOAGULATION CONSULT NOTE  Pharmacy Consult for heparin Indication: pulmonary embolus  No Known Allergies  Patient Measurements: Height: 5\' 6"  (167.6 cm) Weight: 202 lb 9.6 oz (91.9 kg) IBW/kg (Calculated) : 59.3 Heparin Dosing Weight: 93 kg  Vital Signs: Temp: 99.1 F (37.3 C) (01/01 0432) Temp Source: Oral (01/01 0432) BP: 136/88 (01/01 0432) Pulse Rate: 49 (01/01 0432)  Labs: Recent Labs    04/10/19 1038 04/11/19 0624 04/11/19 0931 04/11/19 1930 04/12/19 0255  HGB 12.6 12.1  --   --  11.1*  HCT 39.9 36.9  --   --  34.4*  PLT 220 168  --   --  178  HEPARINUNFRC  --   --  1.04* 1.04* 1.05*  CREATININE 0.99 1.03*  --   --  0.82  TROPONINIHS  --  10  --   --   --     Estimated Creatinine Clearance: 71.8 mL/min (by C-G formula based on SCr of 0.82 mg/dL).  Assessment: 74 y.o. female presented on 04/10/2019 with shortness of breath. Pharmacy consulted to dose heparin for PE. CTA found tiny nonobstructive filling detect in right lower lobe segmental sized pulmonary artery branch concerning for PE and mildly enlarged right hilar lymph node measuring 1.5cm with recommended follow up CT chest in 3 months to reassess the potential for right lower lobe PE. D-dimer 2.07. Heparin level of 0.67 is therapeutic but close to the upper range (0.3 to 0.7) on heparin 900 units/hr. CBC stable. No reported bleeding   Goal of Therapy:  Heparin level 0.3-0.7 units/ml Monitor platelets by anticoagulation protocol: Yes   Plan:  Decrease heparin slightly to 850 units/hr to ensure remains within goal range  Check heparin level at 1930  Follow up transition to oral anticoagulation  Monitor heparin level, CBC, and S/S of bleeding daily   Cristela Felt, PharmD PGY1 Pharmacy Resident [employer]: [phone]   04/12/2019 7:02 AM

## 2019-04-12 NOTE — Progress Notes (Signed)
ANTICOAGULATION CONSULT NOTE  Pharmacy Consult for heparin Indication: pulmonary embolus  No Known Allergies  Patient Measurements: Height: 5\' 6"  (167.6 cm) Weight: 205 lb 0.4 oz (93 kg) IBW/kg (Calculated) : 59.3 Heparin Dosing Weight: 93 kg  Vital Signs: Temp: 99.1 F (37.3 C) (01/01 0432) Temp Source: Oral (01/01 0432) BP: 142/79 (12/31 2052) Pulse Rate: 49 (01/01 0432)  Labs: Recent Labs    04/10/19 1038 04/11/19 0624 04/11/19 0931 04/11/19 1930 04/12/19 0255  HGB 12.6 12.1  --   --  11.1*  HCT 39.9 36.9  --   --  34.4*  PLT 220 168  --   --  178  HEPARINUNFRC  --   --  1.04* 1.04* 1.05*  CREATININE 0.99 1.03*  --   --  0.82  TROPONINIHS  --  10  --   --   --     Estimated Creatinine Clearance: 72.3 mL/min (by C-G formula based on SCr of 0.82 mg/dL).  Assessment: 74 y.o. female with PE for heparin  Goal of Therapy:  Heparin level 0.3-0.7 units/ml Monitor platelets by anticoagulation protocol: Yes   Plan:  Hold heparin x 1 hour, then Decrease Heparin 900 units/hr Check heparin level in 6 hours.  F/u transition to [place], PharmD, BCPS   04/12/2019 4:57 AM

## 2019-04-12 NOTE — Progress Notes (Signed)
[facility]  PROGRESS NOTE  Kristina [name] [id number] DOB: 25-Jan-1946 DOA: 04/10/2019 PCP: Nolene Ebbs, MD   Brief HPI:   74 year old female with history of seizure disorder, hypertension, carpal tunnel syndrome, childhood asthma presents with 3 days history of cough and malaise.  COVID-19 was positive in the ED.  CTA of the chest showed pulmonary embolism.  Patient admitted with diagnosis of PE in the setting of COVID-19 and mild dyspnea. Inflammatory markers showed LDH 233, procalcitonin less than 0.1, D-dimer 2.07, fibrinogen 486.  Chest x-ray showed no pneumonia so she was not started on remdesivir or Decadron.  Patient is currently not requiring oxygen.    Subjective   Patient seen and examined, chest x-ray obtained yesterday shows bilateral airspace opacities consistent with viral pneumonia.   Assessment/Plan:     1. Segmental pulmonary embolism-CT chest shows tiny nonobstructive filling defect in the right lower lobe, segmental size pulmonary artery branch concerning for pulmonary embolism.  Patient started on heparin.  She is currently not requiring oxygen.  2. Enlarged right hilar lymph node-seen on CT chest.  Lymph node measures 1.5 cm in short axis.  Follow-up CT recommended in 3 months to ensure the stability and resolution of the findings.  3. COVID-19 infection-patient has no respiratory symptoms of chest pain or dyspnea.  She is not requiring oxygen.  Chest x-ray shows bilateral airspace opacities.  Patient was febrile during hospital stay with T-max 100 F.  Will start patient on remdesivir per pharmacy consultation.  Will start Decadron 6 mg p.o. daily.  CRP is 2.1 this morning.  4. Hypokalemia-replete  5. Coronary artery disease-stable, continue Plavix.  6. Hypertension-blood pressure is stable, continue metoprolol, losartan, amlodipine.     SpO2: 97 % on room air  COVID-19 Labs  Recent Labs    04/10/19 2001 04/11/19 0624 04/12/19 0255   DDIMER 2.07* 1.24* 1.06*  FERRITIN  --  40  --   LDH 233*  --   --   CRP  --  3.1* 2.1*    No results found for: SARSCOV2NAA   CBG: No results for input(s): GLUCAP in the last 168 hours.  CBC: Recent Labs  Lab 04/10/19 1038 04/11/19 0624 04/12/19 0255  WBC 4.4 4.0 3.3*  NEUTROABS  --  2.0 1.7  HGB 12.6 12.1 11.1*  HCT 39.9 36.9 34.4*  MCV 94.8 92.9 92.7  PLT 220 168 0000000    Basic Metabolic Panel: Recent Labs  Lab 04/10/19 1038 04/11/19 0624 04/11/19 1126 04/12/19 0255  NA 138 138  --  142  K 3.2* 3.0*  --  3.7  CL 98 102  --  107  CO2 26 26  --  26  GLUCOSE 112* 129*  --  116*  BUN 7* 8  --  10  CREATININE 0.99 1.03*  --  0.82  CALCIUM 8.9 8.4*  --  8.8*  MG  --   --  2.0  --      Liver Function Tests: Recent Labs  Lab 04/11/19 0624 04/12/19 0255  AST 18 15  ALT 12 13  ALKPHOS 67 60  BILITOT 0.5 0.6  PROT 6.2* 6.0*  ALBUMIN 3.2* 3.1*        DVT prophylaxis: Heparin therapy  Code Status: Full code  Family Communication: No family at bedside  Disposition Plan: likely home when medically ready for discharge       Scheduled medications:  . amLODipine  5 mg Oral Daily  . aspirin  81 mg  Oral Daily  . atorvastatin  80 mg Oral q1800  . cholecalciferol  5,000 Units Oral Daily  . clopidogrel  75 mg Oral Daily  . dexamethasone  6 mg Oral Q24H  . furosemide  20 mg Oral Daily  . losartan  50 mg Oral Daily  . metoprolol tartrate  25 mg Oral BID  . sodium chloride flush  10-40 mL Intracatheter Q12H     Antibiotics:   Anti-infectives (From admission, onward)   Start     Dose/Rate Route Frequency Ordered Stop   04/13/19 1000  remdesivir 100 mg in sodium chloride 0.9 % 100 mL IVPB     100 mg 200 mL/hr over 30 Minutes Intravenous Daily 04/12/19 0858 04/17/19 0959   04/12/19 1000  remdesivir 200 mg in sodium chloride 0.9% 250 mL IVPB     200 mg 580 mL/hr over 30 Minutes Intravenous Once 04/12/19 0858 04/12/19 1356       Objective    Vitals:   04/12/19 0432 04/12/19 0742 04/12/19 0800 04/12/19 1000  BP: 136/88  (!) 140/93 123/90  Pulse: (!) 49     Resp: 17     Temp: 99.1 F (37.3 C) 98.4 F (36.9 C)    TempSrc: Oral Oral    SpO2: 97%     Weight: 91.9 kg     Height:        Intake/Output Summary (Last 24 hours) at 04/12/2019 1418 Last data filed at 04/11/2019 1900 Gross per 24 hour  Intake 1052.2 ml  Output 2 ml  Net 1050.2 ml    12/30 1901 - 01/01 0700 In: 3203.6 [I.V.:2188.3] Out: 4 [Urine:3]  Filed Weights   04/10/19 1900 04/12/19 0432  Weight: 93 kg 91.9 kg    Physical Examination:    General-appears in no acute distress  Heart-S1-S2, regular, no murmur auscultated  Lungs-clear to auscultation bilaterally, no wheezing or crackles auscultated  Abdomen-soft, nontender, no organomegaly  Extremities-no edema in the lower extremities  Neuro-alert, oriented x3, no focal deficit noted     BNP (last 3 results) Recent Labs    04/10/19 1500  BNP 9.7     Studies:  CT Head Wo Contrast  Result Date: 04/10/2019 CLINICAL DATA:  74 year old female with history of recurrent syncope. EXAM: CT HEAD WITHOUT CONTRAST TECHNIQUE: Contiguous axial images were obtained from the base of the skull through the vertex without intravenous contrast. COMPARISON:  Head CT 01/04/2018. FINDINGS: Brain: No evidence of acute infarction, hemorrhage, hydrocephalus, extra-axial collection or mass lesion/mass effect. Vascular: No hyperdense vessel or unexpected calcification. Skull: Normal. Area of irregular sclerosis and lucency in the right frontal bone superior and lateral to the right orbit, stable dating back to 2012, presumably fibrous dysplasia. Sinuses/Orbits: No acute finding. Other: None. IMPRESSION: 1. No acute intracranial abnormalities. The appearance of the brain is normal. 2. Right frontal bone fibrous dysplasia redemonstrated. Electronically Signed   By: Vinnie Langton M.D.   On: 04/10/2019 18:05    CT Angio Chest PE W and/or Wo Contrast  Result Date: 04/10/2019 CLINICAL DATA:  74 year old female with history of recurrent syncope. Weakness. Evaluate for pulmonary embolism. EXAM: CT ANGIOGRAPHY CHEST WITH CONTRAST TECHNIQUE: Multidetector CT imaging of the chest was performed using the standard protocol during bolus administration of intravenous contrast. Multiplanar CT image reconstructions and MIPs were obtained to evaluate the vascular anatomy. CONTRAST:  131mL OMNIPAQUE IOHEXOL 350 MG/ML SOLN COMPARISON:  Chest CT 01/04/2018. FINDINGS: Cardiovascular: Small nonobstructive filling defect in a right lower lobe segmental sized  pulmonary artery branch (axial images 145-149 of series 5). No other larger central or lobar sized filling defect. Heart size is normal. There is no significant pericardial fluid, thickening or pericardial calcification. There is aortic atherosclerosis, as well as atherosclerosis of the great vessels of the mediastinum and the coronary arteries, including calcified atherosclerotic plaque in the left anterior descending and right coronary arteries. Thickening calcification of the aortic valve. Mediastinum/Nodes: Mildly enlarged right hilar lymph node measuring 1.5 cm in short axis (axial image 117 of series 5). Borderline enlarged left hilar lymph node measuring 1 cm in short axis. No pathologically enlarged mediastinal lymph nodes. Esophagus is unremarkable in appearance. No axillary lymphadenopathy. Lungs/Pleura: Mild consolidation and volume loss in the left upper lobe. No pleural effusions. No suspicious appearing pulmonary nodules or masses are noted. Upper Abdomen: Unremarkable. Musculoskeletal: There are no aggressive appearing lytic or blastic lesions noted in the visualized portions of the skeleton. Review of the MIP images confirms the above findings. IMPRESSION: 1. Tiny nonobstructive filling defect in a right lower lobe segmental sized pulmonary artery branch, concerning  for pulmonary embolism. However, the possibility of some beam hardening artifact in this region is not excluded. No other larger central or lobar filling defect noted. 2. Mildly enlarged right hilar lymph node measuring 1.5 cm in short axis. Borderline enlarged left hilar lymph node. These are nonspecific, but attention on follow-up chest CT is recommended in 3 months to ensure the the stability or resolution of these findings. This should preferentially be performed as a PE protocol CT scan to reassess the potential right lower lobe pulmonary embolism. 3. Small amount of airspace consolidation and atelectasis in the left upper lobe concerning for pneumonia. 4. Aortic atherosclerosis, in addition to 2 vessel coronary artery disease. Assessment for potential risk factor modification, dietary therapy or pharmacologic therapy may be warranted, if clinically indicated. 5. There are calcifications of the aortic valve. Echocardiographic correlation for evaluation of potential valvular dysfunction may be warranted if clinically indicated. Aortic Atherosclerosis (ICD10-I70.0). Electronically Signed   By: Vinnie Langton M.D.   On: 04/10/2019 18:27   CXR Portable same day  Result Date: 04/11/2019 CLINICAL DATA:  COVID-19 EXAM: PORTABLE CHEST 1 VIEW COMPARISON:  01/04/2018 FINDINGS: There is scattered airspace opacities bilaterally most evident at the left lower lung zone. The lung volumes are somewhat low. The heart size is unremarkable. There is no pneumothorax or large pleural effusion. IMPRESSION: Bilateral airspace opacities consistent with the patient's history of viral pneumonia. Electronically Signed   By: Constance Holster M.D.   On: 04/11/2019 15:42     Admission status: Inpatient: Based on patients clinical presentation and evaluation of above clinical data, I have made determination that patient meets Inpatient criteria at this time.   Oswald Hillock   [facility] If 7PM-7AM, please contact  night-coverage at [email or web address], Office  [phone]  password [email or web address]  04/12/2019, 2:18 PM  LOS: 2 days

## 2019-04-13 LAB — COMPREHENSIVE METABOLIC PANEL
ALT: 11 U/L (ref 0–44)
AST: 33 U/L (ref 15–41)
Albumin: 2.9 g/dL — ABNORMAL LOW (ref 3.5–5.0)
Alkaline Phosphatase: 61 U/L (ref 38–126)
Anion gap: 9 (ref 5–15)
BUN: 14 mg/dL (ref 8–23)
CO2: 26 mmol/L (ref 22–32)
Calcium: 8.5 mg/dL — ABNORMAL LOW (ref 8.9–10.3)
Chloride: 107 mmol/L (ref 98–111)
Creatinine, Ser: 0.82 mg/dL (ref 0.44–1.00)
GFR calc Af Amer: >60 mL/min (ref >60)
GFR calc non Af Amer: >60 mL/min (ref >60)
Glucose, Bld: 98 mg/dL (ref 70–99)
Potassium: 4.6 mmol/L (ref 3.5–5.1)
Sodium: 142 mmol/L (ref 135–145)
Total Bilirubin: 1.5 mg/dL — ABNORMAL HIGH (ref 0.3–1.2)
Total Protein: 5.6 g/dL — ABNORMAL LOW (ref 6.5–8.1)

## 2019-04-13 LAB — CBC WITH DIFFERENTIAL/PLATELET
Abs Immature Granulocytes: 0 10*3/uL (ref 0.00–0.07)
Basophils Absolute: 0 10*3/uL (ref 0.0–0.1)
Basophils Relative: 0 %
Eosinophils Absolute: 0 10*3/uL (ref 0.0–0.5)
Eosinophils Relative: 0 %
HCT: 32.5 % — ABNORMAL LOW (ref 36.0–46.0)
Hemoglobin: 10.4 g/dL — ABNORMAL LOW (ref 12.0–15.0)
Immature Granulocytes: 0 %
Lymphocytes Relative: 34 %
Lymphs Abs: 1.6 10*3/uL (ref 0.7–4.0)
MCH: 30 pg (ref 26.0–34.0)
MCHC: 32 g/dL (ref 30.0–36.0)
MCV: 93.7 fL (ref 80.0–100.0)
Monocytes Absolute: 0.3 10*3/uL (ref 0.1–1.0)
Monocytes Relative: 6 %
Neutro Abs: 2.9 10*3/uL (ref 1.7–7.7)
Neutrophils Relative %: 60 %
Platelets: 200 10*3/uL (ref 150–400)
RBC: 3.47 MIL/uL — ABNORMAL LOW (ref 3.87–5.11)
RDW: 14.4 % (ref 11.5–15.5)
WBC: 4.7 10*3/uL (ref 4.0–10.5)
nRBC: 0 % (ref 0.0–0.2)

## 2019-04-13 LAB — D-DIMER, QUANTITATIVE: D-Dimer, Quant: 0.89 ug/mL-FEU — ABNORMAL HIGH (ref 0.00–0.50)

## 2019-04-13 LAB — HEPARIN LEVEL (UNFRACTIONATED): Heparin Unfractionated: 0.83 IU/mL — ABNORMAL HIGH (ref 0.30–0.70)

## 2019-04-13 MED ORDER — RIVAROXABAN 20 MG PO TABS: 20.0000 mg | ORAL_TABLET | Freq: Every day | ORAL | Status: DC

## 2019-04-13 MED ORDER — RIVAROXABAN 15 MG PO TABS
15.0000 mg | ORAL_TABLET | Freq: Two times a day (BID) | ORAL | Status: DC
  Administered 2019-04-13 – 2019-04-16 (×7): 15 mg via ORAL
  Filled 2019-04-13 (×8): qty 1

## 2019-04-13 NOTE — Plan of Care (Signed)
POC reviewed with pt, A&O, no c/o pain, in NSR/SB, BP stable, afebrile. On RA, tolerating well. Remains on heparin gtt titrated per order. Am labs to be drawn. Standby assist to [facility], adequate output. No BM this shift. Assisted with left/right turns q2. No other issues noted at this time.

## 2019-04-13 NOTE — Progress Notes (Signed)
[place] for Transition from Heparin to Rivaroxaban  Indication: pulmonary embolus  No Known Allergies  Patient Measurements: Height: 5\' 6"  (167.6 cm) Weight: 202 lb 9.6 oz (91.9 kg) IBW/kg (Calculated) : 59.3 Heparin Dosing Weight: 93 kg  Vital Signs: Temp: 98.4 F (36.9 C) (01/02 0400) Temp Source: Oral (01/02 0400) BP: 130/76 (01/02 0400) Pulse Rate: 53 (01/02 0400)  Labs: Recent Labs    04/11/19 0624 04/12/19 0255 04/12/19 1135 04/12/19 2205 04/13/19 0413  HGB 12.1 11.1*  --   --  10.4*  HCT 36.9 34.4*  --   --  32.5*  PLT 168 178  --   --  200  HEPARINUNFRC  --  1.05* 0.67 0.24* 0.83*  CREATININE 1.03* 0.82  --   --  0.82  TROPONINIHS 10  --   --   --   --     Estimated Creatinine Clearance: 71.8 mL/min (by C-G formula based on SCr of 0.82 mg/dL).  Assessment: 74 y.o. female presented on 04/10/2019 with shortness of breath. CTA found tiny nonobstructive filling detect in right lower lobe segmental sized pulmonary artery branch concerning for PE and mildly enlarged right hilar lymph node measuring 1.5cm with recommended follow up CT chest in 3 months to reassess the potential for right lower lobe PE. D-dimer 2.07. Patient currently on heparin 850 units/hr. Pharmacy consulted to transition from heparin to rivaroxaban.   Goal of Therapy:  Heparin level 0.3-0.7 units/ml Monitor platelets by anticoagulation protocol: Yes   Plan:  Discontinue heparin Start rivaroxaban 15mg  twice daily for 21 days followed by 20mg  daily - administer first dose of rivaroxaban at same time heparin is stopped (RN aware) Pharmacy to provide education  Follow up case management consult to assess cost   Cristela Felt, PharmD PGY1 Pharmacy Resident [employer]: [phone]   04/13/2019 12:16 PM

## 2019-04-13 NOTE — Progress Notes (Signed)
[place] for heparin Indication: pulmonary embolus  No Known Allergies  Patient Measurements: Height: 5\' 6"  (167.6 cm) Weight: 202 lb 9.6 oz (91.9 kg) IBW/kg (Calculated) : 59.3 Heparin Dosing Weight: 93 kg  Vital Signs: Temp: 98.4 F (36.9 C) (01/02 0400) Temp Source: Oral (01/02 0400) BP: 130/76 (01/02 0400) Pulse Rate: 53 (01/02 0400)  Labs: Recent Labs    04/11/19 0624 04/12/19 0255 04/12/19 1135 04/12/19 2205 04/13/19 0413  HGB 12.1 11.1*  --   --  10.4*  HCT 36.9 34.4*  --   --  32.5*  PLT 168 178  --   --  200  HEPARINUNFRC  --  1.05* 0.67 0.24* 0.83*  CREATININE 1.03* 0.82  --   --  0.82  TROPONINIHS 10  --   --   --   --     Estimated Creatinine Clearance: 71.8 mL/min (by C-G formula based on SCr of 0.82 mg/dL).  Assessment: 74 y.o. female with PE for heparin  Goal of Therapy:  Heparin level 0.3-0.7 units/ml Monitor platelets by anticoagulation protocol: Yes   Plan:  Decrease Heparin 850 units/hr F/u transition to [place], PharmD, BCPS   04/13/2019 5:50 AM

## 2019-04-13 NOTE — Progress Notes (Signed)
[facility]  PROGRESS NOTE  Kristina [name] [id number] DOB: 11/22/1945 DOA: 04/10/2019 PCP: Nolene Ebbs, MD   Brief HPI:   74 year old female with history of seizure disorder, hypertension, carpal tunnel syndrome, childhood asthma presents with 3 days history of cough and malaise.  COVID-19 was positive in the ED.  CTA of the chest showed pulmonary embolism.  Patient admitted with diagnosis of PE in the setting of COVID-19 and mild dyspnea. Inflammatory markers showed LDH 233, procalcitonin less than 0.1, D-dimer 2.07, fibrinogen 486.  Chest x-ray showed no pneumonia so she was not started on remdesivir or Decadron.  Patient is currently not requiring oxygen.    Subjective   Patient seen and examined, denies chest pain or shortness of breath.  She is currently not requiring oxygen.  Started on remdesivir and Decadron yesterday.   Assessment/Plan:    1. Segmental pulmonary embolism-CT chest shows tiny nonobstructive filling defect in the right lower lobe, segmental size pulmonary artery branch concerning for pulmonary embolism.  Patient started on heparin.  She is currently not requiring oxygen.  We will switch her from heparin to Xarelto.  2. Enlarged right hilar lymph node-seen on CT chest.  Lymph node measures 1.5 cm in short axis.  Follow-up CT recommended in 3 months to ensure the stability and resolution of the findings.  3. COVID-19 infection-patient has no respiratory symptoms of chest pain or dyspnea.  She is not requiring oxygen.  Chest x-ray shows bilateral airspace opacities.  Patient was febrile during hospital stay with T-max 100 F.  Patient started on  remdesivir per pharmacy consultation.  Decadron 6 mg p.o. daily.  CRP is 2.1.  D-dimer 0.89.  4. Hypokalemia-replete  5. Coronary artery disease-stable, continue Plavix.  6. Hypertension-blood pressure is stable, continue metoprolol, losartan, amlodipine.     SpO2: 96 % on room air  COVID-19  Labs  Recent Labs    04/10/19 2001 04/11/19 0624 04/12/19 0255 04/13/19 0413  DDIMER 2.07* 1.24* 1.06* 0.89*  FERRITIN  --  40  --   --   LDH 233*  --   --   --   CRP  --  3.1* 2.1*  --     CBC: Recent Labs  Lab 04/10/19 1038 04/11/19 0624 04/12/19 0255 04/13/19 0413  WBC 4.4 4.0 3.3* 4.7  NEUTROABS  --  2.0 1.7 2.9  HGB 12.6 12.1 11.1* 10.4*  HCT 39.9 36.9 34.4* 32.5*  MCV 94.8 92.9 92.7 93.7  PLT 220 168 178 [id number]    Basic Metabolic Panel: Recent Labs  Lab 04/10/19 1038 04/11/19 0624 04/11/19 1126 04/12/19 0255 04/13/19 0413  NA 138 138  --  142 142  K 3.2* 3.0*  --  3.7 4.6  CL 98 102  --  107 107  CO2 26 26  --  26 26  GLUCOSE 112* 129*  --  116* 98  BUN 7* 8  --  10 14  CREATININE 0.99 1.03*  --  0.82 0.82  CALCIUM 8.9 8.4*  --  8.8* 8.5*  MG  --   --  2.0  --   --      Liver Function Tests: Recent Labs  Lab 04/11/19 0624 04/12/19 0255 04/13/19 0413  AST 18 15 33  ALT 12 13 11   ALKPHOS 67 60 61  BILITOT 0.5 0.6 1.5*  PROT 6.2* 6.0* 5.6*  ALBUMIN 3.2* 3.1* 2.9*        DVT prophylaxis: Heparin therapy  Code Status: Full code  Family Communication: No family at bedside  Disposition Plan: likely home when medically ready for discharge       Scheduled medications:  . amLODipine  5 mg Oral Daily  . aspirin  81 mg Oral Daily  . atorvastatin  80 mg Oral q1800  . cholecalciferol  5,000 Units Oral Daily  . clopidogrel  75 mg Oral Daily  . dexamethasone  6 mg Oral Q24H  . furosemide  20 mg Oral Daily  . losartan  50 mg Oral Daily  . metoprolol tartrate  25 mg Oral BID  . rivaroxaban  15 mg Oral BID   Followed by  . [START ON 05/04/2019] rivaroxaban  20 mg Oral Q supper  . sodium chloride flush  10-40 mL Intracatheter Q12H     Antibiotics:   Anti-infectives (From admission, onward)   Start     Dose/Rate Route Frequency Ordered Stop   04/13/19 1000  remdesivir 100 mg in sodium chloride 0.9 % 100 mL IVPB     100 mg 200 mL/hr  over 30 Minutes Intravenous Daily 04/12/19 0858 04/17/19 0959   04/12/19 1000  remdesivir 200 mg in sodium chloride 0.9% 250 mL IVPB     200 mg 580 mL/hr over 30 Minutes Intravenous Once 04/12/19 0858 04/12/19 2337       Objective   Vitals:   04/12/19 2129 04/13/19 0000 04/13/19 0400 04/13/19 1250  BP:  132/80 130/76 (!) 143/102  Pulse:  (!) 56 (!) 53 66  Resp:      Temp: 98.3 F (36.8 C) 98.6 F (37 C) 98.4 F (36.9 C) 98.6 F (37 C)  TempSrc:  Oral Oral Oral  SpO2:  98% 97% 96%  Weight:      Height:        Intake/Output Summary (Last 24 hours) at 04/13/2019 1349 Last data filed at 04/13/2019 0000 Gross per 24 hour  Intake 1855.04 ml  Output --  Net 1855.04 ml    12/31 1901 - 01/02 0700 In: 1975 [P.O.:360; I.V.:1265] Out: -   Filed Weights   04/10/19 1900 04/12/19 0432  Weight: 93 kg 91.9 kg    Physical Examination:   General-appears in no acute distress Heart-S1-S2, regular, no murmur auscultated Lungs-clear to auscultation bilaterally, no wheezing or crackles auscultated Abdomen-soft, nontender, no organomegaly Extremities-no edema in the lower extremities Neuro-alert, oriented x3, no focal deficit noted    BNP (last 3 results) Recent Labs    04/10/19 1500  BNP 9.7     Studies:  CXR Portable same day  Result Date: 04/11/2019 CLINICAL DATA:  COVID-19 EXAM: PORTABLE CHEST 1 VIEW COMPARISON:  01/04/2018 FINDINGS: There is scattered airspace opacities bilaterally most evident at the left lower lung zone. The lung volumes are somewhat low. The heart size is unremarkable. There is no pneumothorax or large pleural effusion. IMPRESSION: Bilateral airspace opacities consistent with the patient's history of viral pneumonia. Electronically Signed   By: Constance Holster M.D.   On: 04/11/2019 15:42     Admission status: Inpatient: Based on patients clinical presentation and evaluation of above clinical data, I have made determination that patient meets  Inpatient criteria at this time.   Oswald Hillock   [facility] If 7PM-7AM, please contact night-coverage at [email or web address], Office  [phone]  password [email or web address]  04/13/2019, 1:49 PM  LOS: 3 days

## 2019-04-14 LAB — CBC WITH DIFFERENTIAL/PLATELET
Abs Immature Granulocytes: 0.01 10*3/uL (ref 0.00–0.07)
Basophils Absolute: 0 10*3/uL (ref 0.0–0.1)
Basophils Relative: 0 %
Eosinophils Absolute: 0 10*3/uL (ref 0.0–0.5)
Eosinophils Relative: 0 %
HCT: 33.2 % — ABNORMAL LOW (ref 36.0–46.0)
Hemoglobin: 10.6 g/dL — ABNORMAL LOW (ref 12.0–15.0)
Immature Granulocytes: 0 %
Lymphocytes Relative: 35 %
Lymphs Abs: 1.6 10*3/uL (ref 0.7–4.0)
MCH: 30.1 pg (ref 26.0–34.0)
MCHC: 31.9 g/dL (ref 30.0–36.0)
MCV: 94.3 fL (ref 80.0–100.0)
Monocytes Absolute: 0.3 10*3/uL (ref 0.1–1.0)
Monocytes Relative: 7 %
Neutro Abs: 2.6 10*3/uL (ref 1.7–7.7)
Neutrophils Relative %: 58 %
Platelets: 205 10*3/uL (ref 150–400)
RBC: 3.52 MIL/uL — ABNORMAL LOW (ref 3.87–5.11)
RDW: 14.4 % (ref 11.5–15.5)
WBC: 4.6 10*3/uL (ref 4.0–10.5)
nRBC: 0 % (ref 0.0–0.2)

## 2019-04-14 LAB — COMPREHENSIVE METABOLIC PANEL
ALT: 15 U/L (ref 0–44)
AST: 16 U/L (ref 15–41)
Albumin: 2.9 g/dL — ABNORMAL LOW (ref 3.5–5.0)
Alkaline Phosphatase: 65 U/L (ref 38–126)
Anion gap: 9 (ref 5–15)
BUN: 14 mg/dL (ref 8–23)
CO2: 27 mmol/L (ref 22–32)
Calcium: 8.7 mg/dL — ABNORMAL LOW (ref 8.9–10.3)
Chloride: 106 mmol/L (ref 98–111)
Creatinine, Ser: 0.72 mg/dL (ref 0.44–1.00)
GFR calc Af Amer: >60 mL/min (ref >60)
GFR calc non Af Amer: >60 mL/min (ref >60)
Glucose, Bld: 108 mg/dL — ABNORMAL HIGH (ref 70–99)
Potassium: 3.3 mmol/L — ABNORMAL LOW (ref 3.5–5.1)
Sodium: 142 mmol/L (ref 135–145)
Total Bilirubin: 0.6 mg/dL (ref 0.3–1.2)
Total Protein: 5.8 g/dL — ABNORMAL LOW (ref 6.5–8.1)

## 2019-04-14 MED ORDER — ALBUTEROL SULFATE HFA 108 (90 BASE) MCG/ACT IN AERS
2.0000 | INHALATION_SPRAY | Freq: Two times a day (BID) | RESPIRATORY_TRACT | Status: DC
  Administered 2019-04-14 – 2019-04-16 (×5): 2 via RESPIRATORY_TRACT
  Filled 2019-04-14: qty 6.7

## 2019-04-14 MED ORDER — ZINC SULFATE 220 (50 ZN) MG PO CAPS
220.0000 mg | ORAL_CAPSULE | Freq: Every day | ORAL | Status: DC
  Administered 2019-04-14 – 2019-04-16 (×3): 220 mg via ORAL
  Filled 2019-04-14 (×3): qty 1

## 2019-04-14 MED ORDER — IPRATROPIUM BROMIDE HFA 17 MCG/ACT IN AERS
2.0000 | INHALATION_SPRAY | Freq: Two times a day (BID) | RESPIRATORY_TRACT | Status: DC
  Administered 2019-04-14 – 2019-04-16 (×5): 2 via RESPIRATORY_TRACT
  Filled 2019-04-14: qty 12.9

## 2019-04-14 MED ORDER — ASCORBIC ACID 500 MG PO TABS
500.0000 mg | ORAL_TABLET | Freq: Every day | ORAL | Status: DC
  Administered 2019-04-14 – 2019-04-16 (×3): 500 mg via ORAL
  Filled 2019-04-14 (×3): qty 1

## 2019-04-14 MED ORDER — POTASSIUM CHLORIDE CRYS ER 20 MEQ PO TBCR
40.0000 meq | EXTENDED_RELEASE_TABLET | Freq: Two times a day (BID) | ORAL | Status: AC
  Administered 2019-04-14 (×2): 40 meq via ORAL
  Filled 2019-04-14 (×2): qty 2

## 2019-04-14 NOTE — Progress Notes (Signed)
PROGRESS NOTE  Kristina [name] [id number] DOB: 02-16-1946 DOA: 04/10/2019 PCP: Nolene Ebbs, MD  HPI/Recap of past 20 hours: 74 year old female with history of seizure disorder, hypertension, carpal tunnel syndrome, childhood asthma presents with 3 days history of cough and malaise.  COVID-19 was positive in the ED.  CTA of the chest showed pulmonary embolism.  Patient admitted with diagnosis of PE in the setting of COVID-19 and mild dyspnea. Inflammatory markers showed LDH 233, procalcitonin less than 0.1, D-dimer 2.07, fibrinogen 486.  Chest x-ray showed no pneumonia so she was not started on remdesivir or Decadron.  Patient is currently not requiring oxygen.  04/14/19: Seen and examined.  No new complaints.  Will obtain home O2 evaluation for DC planning.  Transient hypotension, DCed normal sodium infusion which could contribute.  Assessment/Plan: Principal Problem:   Pulmonary embolism and infarction [facility]) Active Problems:   Benign essential HTN   CAD S/P percutaneous coronary angioplasty   Dyslipidemia   Hypokalemia   Acute COVID-19 viral pneumonia Currently on remdesivir and Decadron Independently reviewed chest x-ray done on admission which showed mild bilateral patchy infiltrates. DC IV fluid hydration Continue p.o. Lasix 20 mg daily also continue losartan 50 mg daily Start bronchodilators albuterol nebs 2 puffs and ipratropium 2 puffs 2 times daily Start vitamin D3, vitamin C and zinc. Maintain O2 saturation greater than 94% Home O2 evaluation for DC planning  Segmental PE switched to Xarelto from heparin drip, continue Appears stable at this time  Chronic diastolic CHF Last 2D echo done in 2011 showed normal LVEF with grade 1 diastolic dysfunction Monitor volume status with strict I's and O's and daily weight She is currently on p.o. Lasix and losartan, monitor urine output and creatinine levels  Hypokalemia Despite use of losartan but she is also on Lasix  which can contribute to hypokalemia Potassium 3.3 Repleted Repeat BMP at magnesium level in the morning  Intermittent bradycardia in the setting of beta-blocker and calcium channel blocker On Lopressor 25 mg twice daily Discontinue Norvasc If blood pressure control needed will add p.o. hydralazine  Transient uncontrolled hypertension Likely contributed by normal saline IV hydration, discontinued on 04/14/2019. Continue to monitor blood pressure off normal saline IV fluid hydration If continues to be uncontrolled we will add hydralazine po 3 times daily.  Coronary artery disease Denies any anginal symptoms at the time of this visit  Right hilar Lymph node enlargement Follow-up CT recommended in 3 months to ensure resolution of the findings  DVT prophylaxis:  Xarelto.  Code Status: Full code  Family Communication: No family at bedside  Disposition Plan: likely home when medically ready for discharge     Objective: Vitals:   04/13/19 0400 04/13/19 1250 04/13/19 2051 04/14/19 0513  BP: 130/76 (!) 143/102 116/65 (!) 145/77  Pulse: (!) 53 66 (!) 54 (!) 54  Resp:      Temp: 98.4 F (36.9 C) 98.6 F (37 C) 98.3 F (36.8 C) 98.3 F (36.8 C)  TempSrc: Oral Oral Oral Oral  SpO2: 97% 96% 94% 94%  Weight:    93.7 kg  Height:        Intake/Output Summary (Last 24 hours) at 04/14/2019 1153 Last data filed at 04/13/2019 2005 Gross per 24 hour  Intake 570 ml  Output --  Net 570 ml   Filed Weights   04/10/19 1900 04/12/19 0432 04/14/19 0513  Weight: 93 kg 91.9 kg 93.7 kg    Exam:  . General: 74 y.o. year-old female well developed  well nourished in no acute distress.  Alert and oriented x3. . Cardiovascular: Regular rate and rhythm with no rubs or gallops.  No thyromegaly or JVD noted.   [name] Respiratory: Mild rales at bases no wheezing noted.  Good respiratory effort.  . Abdomen: Soft nontender nondistended with normal bowel sounds x4 quadrants. . Musculoskeletal: Trace  lower extremity edema. 2/4 pulses in all 4 extremities. [name] Psychiatry: Mood is appropriate for condition and setting   Data Reviewed: CBC: Recent Labs  Lab 04/10/19 1038 04/11/19 0624 04/12/19 0255 04/13/19 0413 04/14/19 0326  WBC 4.4 4.0 3.3* 4.7 4.6  NEUTROABS  --  2.0 1.7 2.9 2.6  HGB 12.6 12.1 11.1* 10.4* 10.6*  HCT 39.9 36.9 34.4* 32.5* 33.2*  MCV 94.8 92.9 92.7 93.7 94.3  PLT 220 168 178 200 [id number]   Basic Metabolic Panel: Recent Labs  Lab 04/10/19 1038 04/11/19 0624 04/11/19 1126 04/12/19 0255 04/13/19 0413 04/14/19 0326  NA 138 138  --  142 142 142  K 3.2* 3.0*  --  3.7 4.6 3.3*  CL 98 102  --  107 107 106  CO2 26 26  --  26 26 27   GLUCOSE 112* 129*  --  116* 98 108*  BUN 7* 8  --  10 14 14   CREATININE 0.99 1.03*  --  0.82 0.82 0.72  CALCIUM 8.9 8.4*  --  8.8* 8.5* 8.7*  MG  --   --  2.0  --   --   --    GFR: Estimated Creatinine Clearance: 74.4 mL/min (by C-G formula based on SCr of 0.72 mg/dL). Liver Function Tests: Recent Labs  Lab 04/11/19 0624 04/12/19 0255 04/13/19 0413 04/14/19 0326  AST 18 15 33 16  ALT 12 13 11 15   ALKPHOS 67 60 61 65  BILITOT 0.5 0.6 1.5* 0.6  PROT 6.2* 6.0* 5.6* 5.8*  ALBUMIN 3.2* 3.1* 2.9* 2.9*   No results for input(s): LIPASE, AMYLASE in the last 168 hours. No results for input(s): AMMONIA in the last 168 hours. Coagulation Profile: No results for input(s): INR, PROTIME in the last 168 hours. Cardiac Enzymes: No results for input(s): CKTOTAL, CKMB, CKMBINDEX, TROPONINI in the last 168 hours. BNP (last 3 results) No results for input(s): PROBNP in the last 8760 hours. HbA1C: No results for input(s): HGBA1C in the last 72 hours. CBG: No results for input(s): GLUCAP in the last 168 hours. Lipid Profile: No results for input(s): CHOL, HDL, LDLCALC, TRIG, CHOLHDL, LDLDIRECT in the last 72 hours. Thyroid Function Tests: No results for input(s): TSH, T4TOTAL, FREET4, T3FREE, THYROIDAB in the last 72 hours. Anemia  Panel: No results for input(s): VITAMINB12, FOLATE, FERRITIN, TIBC, IRON, RETICCTPCT in the last 72 hours. Urine analysis:    Component Value Date/Time   COLORURINE YELLOW 04/10/2019 [place] 04/10/2019 1735   LABSPEC 1.014 04/10/2019 1735   PHURINE 6.0 04/10/2019 1735   GLUCOSEU NEGATIVE 04/10/2019 1735   HGBUR NEGATIVE 04/10/2019 1735   BILIRUBINUR NEGATIVE 04/10/2019 1735   KETONESUR NEGATIVE 04/10/2019 1735   PROTEINUR NEGATIVE 04/10/2019 1735   UROBILINOGEN 0.2 03/26/2011 0922   NITRITE NEGATIVE 04/10/2019 1735   LEUKOCYTESUR TRACE (A) 04/10/2019 1735   Sepsis Labs: @LABRCNTIP (procalcitonin:4,lacticidven:4)  ) Recent Results (from the past 240 hour(s))  Blood Culture (routine x 2)     Status: None (Preliminary result)   Collection Time: 04/10/19  7:18 PM   Specimen: BLOOD  Result Value Ref Range Status   Specimen Description BLOOD SITE NOT  SPECIFIED  Final   Special Requests   Final    BOTTLES DRAWN AEROBIC AND ANAEROBIC Blood Culture results may not be optimal due to an inadequate volume of blood received in culture bottles   Culture   Final    NO GROWTH 4 DAYS Performed at [facility], [place] [address], [place], [place] [address]    Report Status PENDING  Incomplete  Blood Culture (routine x 2)     Status: None (Preliminary result)   Collection Time: 04/11/19  6:31 AM   Specimen: BLOOD  Result Value Ref Range Status   Specimen Description BLOOD LEFT ANTECUBITAL  Final   Special Requests   Final    BOTTLES DRAWN AEROBIC ONLY Blood Culture adequate volume   Culture   Final    NO GROWTH 3 DAYS Performed at [facility], [place] [address], [place], [place] [address]    Report Status PENDING  Incomplete      Studies: No results found.  Scheduled Meds: . amLODipine  5 mg Oral Daily  . aspirin  81 mg Oral Daily  . atorvastatin  80 mg Oral q1800  . cholecalciferol  5,000 Units Oral Daily  . clopidogrel  75 mg Oral Daily  .  dexamethasone  6 mg Oral Q24H  . furosemide  20 mg Oral Daily  . losartan  50 mg Oral Daily  . metoprolol tartrate  25 mg Oral BID  . potassium chloride  40 mEq Oral BID  . rivaroxaban  15 mg Oral BID   Followed by  . [START ON 05/04/2019] rivaroxaban  20 mg Oral Q supper  . sodium chloride flush  10-40 mL Intracatheter Q12H    Continuous Infusions: . remdesivir 100 mg in NS 100 mL 100 mg (04/14/19 1110)     LOS: 4 days     Kayleen Memos, MD [facility] Pager [phone]  If 7PM-7AM, please contact night-coverage [email or web address] Password [facility] 04/14/2019, 11:53 AM

## 2019-04-15 LAB — COMPREHENSIVE METABOLIC PANEL
ALT: 25 U/L (ref 0–44)
AST: 28 U/L (ref 15–41)
Albumin: 3 g/dL — ABNORMAL LOW (ref 3.5–5.0)
Alkaline Phosphatase: 71 U/L (ref 38–126)
Anion gap: 8 (ref 5–15)
BUN: 13 mg/dL (ref 8–23)
CO2: 27 mmol/L (ref 22–32)
Calcium: 9 mg/dL (ref 8.9–10.3)
Chloride: 106 mmol/L (ref 98–111)
Creatinine, Ser: 0.81 mg/dL (ref 0.44–1.00)
GFR calc Af Amer: >60 mL/min (ref >60)
GFR calc non Af Amer: >60 mL/min (ref >60)
Glucose, Bld: 112 mg/dL — ABNORMAL HIGH (ref 70–99)
Potassium: 3.8 mmol/L (ref 3.5–5.1)
Sodium: 141 mmol/L (ref 135–145)
Total Bilirubin: 0.5 mg/dL (ref 0.3–1.2)
Total Protein: 6.3 g/dL — ABNORMAL LOW (ref 6.5–8.1)

## 2019-04-15 LAB — CBC WITH DIFFERENTIAL/PLATELET
Abs Immature Granulocytes: 0.03 10*3/uL (ref 0.00–0.07)
Basophils Absolute: 0 10*3/uL (ref 0.0–0.1)
Basophils Relative: 0 %
Eosinophils Absolute: 0 10*3/uL (ref 0.0–0.5)
Eosinophils Relative: 0 %
HCT: 35.7 % — ABNORMAL LOW (ref 36.0–46.0)
Hemoglobin: 11.8 g/dL — ABNORMAL LOW (ref 12.0–15.0)
Immature Granulocytes: 1 %
Lymphocytes Relative: 41 %
Lymphs Abs: 2 10*3/uL (ref 0.7–4.0)
MCH: 30.6 pg (ref 26.0–34.0)
MCHC: 33.1 g/dL (ref 30.0–36.0)
MCV: 92.5 fL (ref 80.0–100.0)
Monocytes Absolute: 0.3 10*3/uL (ref 0.1–1.0)
Monocytes Relative: 6 %
Neutro Abs: 2.6 10*3/uL (ref 1.7–7.7)
Neutrophils Relative %: 52 %
Platelets: 238 10*3/uL (ref 150–400)
RBC: 3.86 MIL/uL — ABNORMAL LOW (ref 3.87–5.11)
RDW: 14.4 % (ref 11.5–15.5)
WBC: 4.9 10*3/uL (ref 4.0–10.5)
nRBC: 0 % (ref 0.0–0.2)

## 2019-04-15 LAB — CULTURE, BLOOD (ROUTINE X 2): Culture: NO GROWTH

## 2019-04-15 MED ORDER — METOPROLOL TARTRATE 12.5 MG HALF TABLET
12.5000 mg | ORAL_TABLET | Freq: Two times a day (BID) | ORAL | Status: DC
  Filled 2019-04-15: qty 1

## 2019-04-15 NOTE — Progress Notes (Signed)
PROGRESS NOTE  Kristina [name] [id number] DOB: Jun 08, 1945 DOA: 04/10/2019 PCP: Nolene Ebbs, MD  HPI/Recap of past 6 hours: 74 year old female with history of seizure disorder, hypertension, carpal tunnel syndrome, childhood asthma presents with 3 days history of cough and malaise.  COVID-19 was positive in the ED.  CTA of the chest showed pulmonary embolism.  Patient admitted with diagnosis of PE in the setting of COVID-19 and mild dyspnea. Inflammatory markers showed LDH 233, procalcitonin less than 0.1, D-dimer 2.07, fibrinogen 486.  Chest x-ray showed no pneumonia so she was not started on remdesivir or Decadron.  Patient is currently not requiring oxygen.  04/15/19: Seen and examined.  No new complaints.  O2 saturation improving on remdesivir and Decadron.   Assessment/Plan: Principal Problem:   Pulmonary embolism and infarction [facility]) Active Problems:   Benign essential HTN   CAD S/P percutaneous coronary angioplasty   Dyslipidemia   Hypokalemia   Acute COVID-19 viral pneumonia Currently on remdesivir day number 4 out of 5 and Decadron day number 4 out of 10 Independently reviewed chest x-ray done on admission which showed mild bilateral patchy infiltrates. DC IV fluid hydration Continue p.o. Lasix 20 mg daily also continue losartan 50 mg daily Continue bronchodilators albuterol nebs 2 puffs and ipratropium 2 puffs 2 times daily Continue vitamin D3, vitamin C and zinc. O2 saturation 100% on room air.  Segmental PE switched to Xarelto from heparin drip, continue Appears stable at this time  Chronic diastolic CHF Last 2D echo done in 2011 showed normal LVEF with grade 1 diastolic dysfunction Monitor volume status with strict I's and O's and daily weight She is currently on p.o. Lasix and losartan, monitor urine output and creatinine levels  Resolved post repletion: Hypokalemia Despite use of losartan but she is also on Lasix which can contribute to  hypokalemia Potassium 3.3>> 3.8 Magnesium 2.0 on 04/11/2019.  Intermittent bradycardia in the setting of beta-blocker and calcium channel blocker Bradycardia has resolved off beta-blocker and calcium channel blocker Discontinue Norvasc on 04/14/2019 Holding off beta-blocker If blood pressure control needed will add p.o. hydralazine  Resolved transient uncontrolled hypertension Likely contributed by normal saline IV hydration, discontinued on 04/14/2019. Continue to monitor blood pressure off normal saline IV fluid hydration Blood pressure is now at goal  Coronary artery disease Denies any anginal symptoms at the time of this visit  Right hilar Lymph node enlargement Follow-up CT recommended in 3 months to ensure resolution of the findings  DVT prophylaxis:  Xarelto.  Code Status: Full code  Family Communication: No family at bedside  Disposition Plan: likely home when medically ready for discharge     Objective: Vitals:   04/14/19 2159 04/15/19 0549 04/15/19 1035 04/15/19 1338  BP:  127/80 (!) 143/65 135/80  Pulse:  (!) 47 63 61  Resp:    18  Temp: 97.9 F (36.6 C) 97.8 F (36.6 C)  98.7 F (37.1 C)  TempSrc: Oral Oral  Oral  SpO2: 93% 90%  99%  Weight:      Height:        Intake/Output Summary (Last 24 hours) at 04/15/2019 1456 Last data filed at 04/14/2019 1755 Gross per 24 hour  Intake 200 ml  Output --  Net 200 ml   Filed Weights   04/10/19 1900 04/12/19 0432 04/14/19 0513  Weight: 93 kg 91.9 kg 93.7 kg    Exam:  . General: 74 y.o. year-old female well-developed well-nourished no acute distress.  Alert oriented x3.   [name]  Cardiovascular: Regular rate and rhythm no rubs or gallops.   [name] Respiratory: Clear auscultation no wheezes or rales. . Abdomen: Soft nontender nondistended normal bowel sounds present.  Musculoskeletal: Trace lower extremity edema bilaterally. [name] Psychiatry: Mood is appropriate for condition and setting.   Data Reviewed: CBC: Recent  Labs  Lab 04/11/19 0624 04/12/19 0255 04/13/19 0413 04/14/19 0326 04/15/19 0358  WBC 4.0 3.3* 4.7 4.6 4.9  NEUTROABS 2.0 1.7 2.9 2.6 2.6  HGB 12.1 11.1* 10.4* 10.6* 11.8*  HCT 36.9 34.4* 32.5* 33.2* 35.7*  MCV 92.9 92.7 93.7 94.3 92.5  PLT 168 178 200 205 [id number]   Basic Metabolic Panel: Recent Labs  Lab 04/11/19 0624 04/11/19 1126 04/12/19 0255 04/13/19 0413 04/14/19 0326 04/15/19 0358  NA 138  --  142 142 142 141  K 3.0*  --  3.7 4.6 3.3* 3.8  CL 102  --  107 107 106 106  CO2 26  --  26 26 27 27   GLUCOSE 129*  --  116* 98 108* 112*  BUN 8  --  10 14 14 13   CREATININE 1.03*  --  0.82 0.82 0.72 0.81  CALCIUM 8.4*  --  8.8* 8.5* 8.7* 9.0  MG  --  2.0  --   --   --   --    GFR: Estimated Creatinine Clearance: 73.5 mL/min (by C-G formula based on SCr of 0.81 mg/dL). Liver Function Tests: Recent Labs  Lab 04/11/19 0624 04/12/19 0255 04/13/19 0413 04/14/19 0326 04/15/19 0358  AST 18 15 33 16 28  ALT 12 13 11 15 25   ALKPHOS 67 60 61 65 71  BILITOT 0.5 0.6 1.5* 0.6 0.5  PROT 6.2* 6.0* 5.6* 5.8* 6.3*  ALBUMIN 3.2* 3.1* 2.9* 2.9* 3.0*   No results for input(s): LIPASE, AMYLASE in the last 168 hours. No results for input(s): AMMONIA in the last 168 hours. Coagulation Profile: No results for input(s): INR, PROTIME in the last 168 hours. Cardiac Enzymes: No results for input(s): CKTOTAL, CKMB, CKMBINDEX, TROPONINI in the last 168 hours. BNP (last 3 results) No results for input(s): PROBNP in the last 8760 hours. HbA1C: No results for input(s): HGBA1C in the last 72 hours. CBG: No results for input(s): GLUCAP in the last 168 hours. Lipid Profile: No results for input(s): CHOL, HDL, LDLCALC, TRIG, CHOLHDL, LDLDIRECT in the last 72 hours. Thyroid Function Tests: No results for input(s): TSH, T4TOTAL, FREET4, T3FREE, THYROIDAB in the last 72 hours. Anemia Panel: No results for input(s): VITAMINB12, FOLATE, FERRITIN, TIBC, IRON, RETICCTPCT in the last 72 hours. Urine  analysis:    Component Value Date/Time   COLORURINE YELLOW 04/10/2019 [place] 04/10/2019 1735   LABSPEC 1.014 04/10/2019 1735   PHURINE 6.0 04/10/2019 1735   GLUCOSEU NEGATIVE 04/10/2019 1735   HGBUR NEGATIVE 04/10/2019 1735   BILIRUBINUR NEGATIVE 04/10/2019 1735   KETONESUR NEGATIVE 04/10/2019 1735   PROTEINUR NEGATIVE 04/10/2019 1735   UROBILINOGEN 0.2 03/26/2011 0922   NITRITE NEGATIVE 04/10/2019 1735   LEUKOCYTESUR TRACE (A) 04/10/2019 1735   Sepsis Labs: @LABRCNTIP (procalcitonin:4,lacticidven:4)  ) Recent Results (from the past 240 hour(s))  Blood Culture (routine x 2)     Status: None   Collection Time: 04/10/19  7:18 PM   Specimen: BLOOD  Result Value Ref Range Status   Specimen Description BLOOD SITE NOT SPECIFIED  Final   Special Requests   Final    BOTTLES DRAWN AEROBIC AND ANAEROBIC Blood Culture results may not be optimal due to an  inadequate volume of blood received in culture bottles   Culture   Final    NO GROWTH 5 DAYS Performed at [facility], [place] [address] [place], [place] [address]    Report Status 04/15/2019 FINAL  Final  Blood Culture (routine x 2)     Status: None (Preliminary result)   Collection Time: 04/11/19  6:31 AM   Specimen: BLOOD  Result Value Ref Range Status   Specimen Description BLOOD LEFT ANTECUBITAL  Final   Special Requests   Final    BOTTLES DRAWN AEROBIC ONLY Blood Culture adequate volume   Culture   Final    NO GROWTH 4 DAYS Performed at [facility], [place] [address], [place], [place] [address]    Report Status PENDING  Incomplete      Studies: No results found.  Scheduled Meds: . albuterol  2 puff Inhalation BID  . vitamin C  500 mg Oral Daily  . aspirin  81 mg Oral Daily  . atorvastatin  80 mg Oral q1800  . cholecalciferol  5,000 Units Oral Daily  . clopidogrel  75 mg Oral Daily  . dexamethasone  6 mg Oral Q24H  . furosemide  20 mg Oral Daily  . ipratropium  2 puff Inhalation BID  .  losartan  50 mg Oral Daily  . [START ON 04/16/2019] metoprolol tartrate  12.5 mg Oral BID  . rivaroxaban  15 mg Oral BID   Followed by  . [START ON 05/04/2019] rivaroxaban  20 mg Oral Q supper  . sodium chloride flush  10-40 mL Intracatheter Q12H  . zinc sulfate  220 mg Oral Daily    Continuous Infusions: . remdesivir 100 mg in NS 100 mL 100 mg (04/15/19 1048)     LOS: 5 days     Kayleen Memos, MD [facility] Pager [phone]  If 7PM-7AM, please contact night-coverage [email or web address] Password [facility] 04/15/2019, 2:56 PM

## 2019-04-15 NOTE — Plan of Care (Signed)
  Problem: Education: Goal: Knowledge of risk factors and measures for prevention of condition will improve Outcome: Progressing   Problem: Coping: Goal: Psychosocial and spiritual needs will be supported Outcome: Progressing   Problem: Respiratory: Goal: Will maintain a patent airway Outcome: Progressing   

## 2019-04-15 NOTE — Evaluation (Signed)
Physical Therapy Evaluation Patient Details Name: Kristina [name] MRN: [id number] DOB: Sep 28, 1945 Today's Date: 04/15/2019   History of Present Illness  74 year old female with history of seizure disorder, hypertension, carpal tunnel syndrome, childhood asthma presents with 3 days history of cough and malaise.  COVID-19 was positive in the ED.  CTA of the chest showed pulmonary embolism.    Clinical Impression  PT eval complete. Pt is independent with all functional mobility. SpO2 97-100% during ambulation on RA. No further PT intervention indicated. PT signing off.    Follow Up Recommendations No PT follow up    Equipment Recommendations  None recommended by PT    Recommendations for Other Services       Precautions / Restrictions Precautions Precautions: None      Mobility  Bed Mobility Overal bed mobility: Independent                Transfers Overall transfer level: Independent Equipment used: None                Ambulation/Gait Ambulation/Gait assistance: Supervision Gait Distance (Feet): 350 Feet Assistive device: None Gait Pattern/deviations: WFL(Within Functional Limits) Gait velocity: WFL Gait velocity interpretation: >2.62 ft/sec, indicative of community ambulatory General Gait Details: Pt ambulated on RA with SpO2 97-100%  Stairs            Wheelchair Mobility    Modified Rankin (Stroke Patients Only)       Balance Overall balance assessment: Mild deficits observed, not formally tested                                           Pertinent Vitals/Pain Pain Assessment: No/denies pain    Home Living Family/patient expects to be discharged to:: Private residence Living Arrangements: Children Available Help at Discharge: Family;Friend(s);Available PRN/intermittently Type of Home: House Home Access: Level entry     Home Layout: One level Home Equipment: Clinical cytogeneticist - 2 wheels;Cane - single point       Prior Function Level of Independence: Independent         Comments: works, Dispensing optician   Dominant Hand: Right    Extremity/Trunk Assessment   Upper Extremity Assessment Upper Extremity Assessment: Overall WFL for tasks assessed    Lower Extremity Assessment Lower Extremity Assessment: Overall WFL for tasks assessed    Cervical / Trunk Assessment Cervical / Trunk Assessment: Normal  Communication   Communication: No difficulties  Cognition Arousal/Alertness: Awake/alert Behavior During Therapy: WFL for tasks assessed/performed Overall Cognitive Status: Within Functional Limits for tasks assessed                                        General Comments      Exercises     Assessment/Plan    PT Assessment Patent does not need any further PT services  PT Problem List         PT Treatment Interventions      PT Goals (Current goals can be found in the Care Plan section)  Acute Rehab PT Goals Patient Stated Goal: home PT Goal Formulation: All assessment and education complete, DC therapy    Frequency     Barriers to discharge        Co-evaluation  AM-PAC PT "6 Clicks" Mobility  Outcome Measure Help needed turning from your back to your side while in a flat bed without using bedrails?: None Help needed moving from lying on your back to sitting on the side of a flat bed without using bedrails?: None Help needed moving to and from a bed to a chair (including a wheelchair)?: None Help needed standing up from a chair using your arms (e.g., wheelchair or bedside chair)?: None Help needed to walk in hospital room?: None Help needed climbing 3-5 steps with a railing? : None 6 Click Score: 24    End of Session   Activity Tolerance: Patient tolerated treatment well Patient left: in chair;with call bell/phone within reach Nurse Communication: Mobility status PT Visit Diagnosis: Difficulty in walking, not  elsewhere classified (R26.2)    Time: [id number] PT Time Calculation (min) (ACUTE ONLY): 17 min   Charges:   PT Evaluation $PT Eval Low Complexity: 1 Low          Lorrin Goodell, PT  Office # [phone] Pager [phone]   [name] 04/15/2019, 12:13 PM

## 2019-04-15 NOTE — TOC Benefit Eligibility Note (Signed)
Transition of Care [facility]) Benefit Eligibility Note    Patient Details  Name: Kristina [name] MRN: [id number] Date of Birth: 04-30-1945   Medication/Dose: Alveda Reasons     Tier: 2 Drug  Prescription Coverage Preferred Pharmacy: Most major retail pharmacies  Spoke with Person/Company/Phone Number:: [employer]  Co-Pay: $35  Prior Approval: No          Delorse Lek Phone Number: 04/15/2019, 1:26 PM

## 2019-04-16 LAB — CULTURE, BLOOD (ROUTINE X 2)
Culture: NO GROWTH
Special Requests: ADEQUATE

## 2019-04-16 MED ORDER — VITAMIN D3 25 MCG PO TABS: 5000.0000 [IU] | ORAL_TABLET | Freq: Every day | ORAL | 0 refills | Status: DC

## 2019-04-16 MED ORDER — ALBUTEROL SULFATE HFA 108 (90 BASE) MCG/ACT IN AERS: 2.0000 | INHALATION_SPRAY | Freq: Two times a day (BID) | RESPIRATORY_TRACT | 0 refills | Status: DC | PRN

## 2019-04-16 MED ORDER — RIVAROXABAN (XARELTO) VTE STARTER PACK (15 & 20 MG): ORAL_TABLET | ORAL | 0 refills | Status: DC

## 2019-04-16 MED ORDER — DEXAMETHASONE 6 MG PO TABS: 6.0000 mg | ORAL_TABLET | ORAL | 0 refills | Status: DC

## 2019-04-16 MED ORDER — DEXAMETHASONE 6 MG PO TABS: 6.0000 mg | ORAL_TABLET | ORAL | 0 refills | Status: AC

## 2019-04-16 MED ORDER — ASCORBIC ACID 500 MG PO TABS: 500.0000 mg | ORAL_TABLET | Freq: Every day | ORAL | 0 refills | Status: DC

## 2019-04-16 MED ORDER — VITAMIN D3 25 MCG PO TABS: 5000.0000 [IU] | ORAL_TABLET | Freq: Every day | ORAL | 0 refills | Status: AC

## 2019-04-16 MED ORDER — ASCORBIC ACID 500 MG PO TABS: 500.0000 mg | ORAL_TABLET | Freq: Every day | ORAL | 0 refills | Status: AC

## 2019-04-16 MED ORDER — ZINC SULFATE 220 (50 ZN) MG PO CAPS: 220.0000 mg | ORAL_CAPSULE | Freq: Every day | ORAL | 0 refills | Status: DC

## 2019-04-16 MED ORDER — RIVAROXABAN 20 MG PO TABS: 20.0000 mg | ORAL_TABLET | Freq: Every day | ORAL | 0 refills | Status: DC

## 2019-04-16 MED ORDER — RIVAROXABAN 15 MG PO TABS: 15.0000 mg | ORAL_TABLET | Freq: Two times a day (BID) | ORAL | 0 refills | Status: DC

## 2019-04-16 MED FILL — ZINC SULFATE 220 MG TABLET: 220 (50 ZN) | 60 days supply | Qty: 60 | Fill #0

## 2019-04-16 MED FILL — VITAMIN D3 5,000 UNIT TAB: 125 MCG | 30 days supply | Qty: 30 | Fill #0

## 2019-04-16 MED FILL — XARELTO STARTER PACK: 15 & 20 | 30 days supply | Qty: 51 | Fill #0

## 2019-04-16 MED FILL — DEXAMETHASONE 2 MG TABLET: 2 | 5 days supply | Qty: 15 | Fill #0

## 2019-04-16 MED FILL — VITAMIN C 500 MG TABLET: 500 | 60 days supply | Qty: 60 | Fill #0

## 2019-04-16 MED FILL — VENTOLIN HFA 90 MCG INHALER: 108 (90 BAS | 30 days supply | Qty: 18 | Fill #0

## 2019-04-16 NOTE — Progress Notes (Signed)
[place] for Rivaroxaban  Indication: pulmonary embolus  No Known Allergies  Patient Measurements: Height: 5\' 6"  (167.6 cm) Weight: 206 lb 9.1 oz (93.7 kg) IBW/kg (Calculated) : 59.3 Heparin Dosing Weight: 93 kg  Vital Signs: Temp: 98.3 F (36.8 C) (01/05 0503) Temp Source: Oral (01/04 2352) BP: 118/69 (01/05 0503) Pulse Rate: 53 (01/04 2352)  Labs: Recent Labs    04/14/19 0326 04/15/19 0358  HGB 10.6* 11.8*  HCT 33.2* 35.7*  PLT 205 238  CREATININE 0.72 0.81    Estimated Creatinine Clearance: 73.5 mL/min (by C-G formula based on SCr of 0.81 mg/dL).  Assessment: 74 y.o. female presented on 04/10/2019 with shortness of breath. CTA found tiny nonobstructive filling detect in right lower lobe segmental sized pulmonary artery branch concerning for PE and mildly enlarged right hilar lymph node measuring 1.5cm with recommended follow up CT chest in 3 months to reassess the potential for right lower lobe PE.[name]  Pharmacy consulted to transition from heparin to rivaroxaban.   Copay for xarelto is $35 per month. Patient informed of amount and educated on xarelto by telephone.   Goal of Therapy:  Monitor platelets by anticoagulation protocol: Yes   Plan:  Continue rivaroxaban 15mg  twice daily for 21 days followed by 20mg  daily     Tyja Gortney A. Levada Dy, PharmD, BCPS, FNKF Clinical Pharmacist [place] Please utilize Amion for appropriate phone number to reach the unit pharmacist ([place])     04/16/2019 8:51 AM

## 2019-04-16 NOTE — Discharge Instructions (Signed)
10 Things You Can Do to Manage Your COVID-19 Symptoms at Home If you have possible or confirmed COVID-19: 1. Stay home from work and school. And stay away from other public places. If you must go out, avoid using any kind of public transportation, ridesharing, or taxis. 2. Monitor your symptoms carefully. If your symptoms get worse, call your healthcare provider immediately. 3. Get rest and stay hydrated. 4. If you have a medical appointment, call the healthcare provider ahead of time and tell them that you have or may have COVID-19. 5. For medical emergencies, call 911 and notify the dispatch personnel that you have or may have COVID-19. 6. Cover your cough and sneezes with a tissue or use the inside of your elbow. 7. Wash your hands often with soap and water for at least 20 seconds or clean your hands with an alcohol-based hand sanitizer that contains at least 60% alcohol. 8. As much as possible, stay in a specific room and away from other people in your home. Also, you should use a separate bathroom, if available. If you need to be around other people in or outside of the home, wear a mask. 9. Avoid sharing personal items with other people in your household, like dishes, towels, and bedding. 10. Clean all surfaces that are touched often, like counters, tabletops, and doorknobs. Use household cleaning sprays or wipes according to the label instructions. [email or web address] 10/10/2018 This information is not intended to replace advice given to you by your health care provider. Make sure you discuss any questions you have with your health care provider. Document Revised: 03/14/2019 Document Reviewed: 03/14/2019 Elsevier Patient Education  2020 [place].   COVID-19 COVID-19 is a respiratory infection that is caused by a virus called severe acute respiratory syndrome coronavirus 2 (SARS-CoV-2). The disease is also known as coronavirus disease or novel coronavirus. In some people, the virus  may not cause any symptoms. In others, it may cause a serious infection. The infection can get worse quickly and can lead to complications, such as:  Pneumonia, or infection of the lungs.  Acute respiratory distress syndrome or ARDS. This is a condition in which fluid build-up in the lungs prevents the lungs from filling with air and passing oxygen into the blood.  Acute respiratory failure. This is a condition in which there is not enough oxygen passing from the lungs to the body or when carbon dioxide is not passing from the lungs out of the body.  Sepsis or septic shock. This is a serious bodily reaction to an infection.  Blood clotting problems.  Secondary infections due to bacteria or fungus.  Organ failure. This is when your body's organs stop working. The virus that causes COVID-19 is contagious. This means that it can spread from person to person through droplets from coughs and sneezes (respiratory secretions). What are the causes? This illness is caused by a virus. You may catch the virus by:  Breathing in droplets from an infected person. Droplets can be spread by a person breathing, speaking, singing, coughing, or sneezing.  Touching something, like a table or a doorknob, that was exposed to the virus (contaminated) and then touching your mouth, nose, or eyes. What increases the risk? Risk for infection You are more likely to be infected with this virus if you:  Are within 6 feet (2 meters) of a person with COVID-19.  Provide care for or live with a person who is infected with COVID-19.  Spend time in crowded indoor spaces or  live in shared housing. Risk for serious illness You are more likely to become seriously ill from the virus if you:  Are 11 years of age or older. The higher your age, the more you are at risk for serious illness.  Live in a nursing home or long-term care facility.  Have cancer.  Have a long-term (chronic) disease such as: ? Chronic lung  disease, including chronic obstructive pulmonary disease or asthma. ? A long-term disease that lowers your body's ability to fight infection (immunocompromised). ? Heart disease, including heart failure, a condition in which the arteries that lead to the heart become narrow or blocked (coronary artery disease), a disease which makes the heart muscle thick, weak, or stiff (cardiomyopathy). ? Diabetes. ? Chronic kidney disease. ? Sickle cell disease, a condition in which red blood cells have an abnormal "sickle" shape. ? Liver disease.  Are obese. What are the signs or symptoms? Symptoms of this condition can range from mild to severe. Symptoms may appear any time from 2 to 14 days after being exposed to the virus. They include:  A fever or chills.  A cough.  Difficulty breathing.  Headaches, body aches, or muscle aches.  Runny or stuffy (congested) nose.  A sore throat.  New loss of taste or smell. Some people may also have stomach problems, such as nausea, vomiting, or diarrhea. Other people may not have any symptoms of COVID-19. How is this diagnosed? This condition may be diagnosed based on:  Your signs and symptoms, especially if: ? You live in an area with a COVID-19 outbreak. ? You recently traveled to or from an area where the virus is common. ? You provide care for or live with a person who was diagnosed with COVID-19. ? You were exposed to a person who was diagnosed with COVID-19.  A physical exam.  Lab tests, which may include: ? Taking a sample of fluid from the back of your nose and throat (nasopharyngeal fluid), your nose, or your throat using a swab. ? A sample of mucus from your lungs (sputum). ? Blood tests.  Imaging tests, which may include, X-rays, CT scan, or ultrasound. How is this treated? At present, there is no medicine to treat COVID-19. Medicines that treat other diseases are being used on a trial basis to see if they are effective against  COVID-19. Your health care provider will talk with you about ways to treat your symptoms. For most people, the infection is mild and can be managed at home with rest, fluids, and over-the-counter medicines. Treatment for a serious infection usually takes places in a hospital intensive care unit (ICU). It may include one or more of the following treatments. These treatments are given until your symptoms improve.  Receiving fluids and medicines through an IV.  Supplemental oxygen. Extra oxygen is given through a tube in the nose, a face mask, or a hood.  Positioning you to lie on your stomach (prone position). This makes it easier for oxygen to get into the lungs.  Continuous positive airway pressure (CPAP) or bi-level positive airway pressure (BPAP) machine. This treatment uses mild air pressure to keep the airways open. A tube that is connected to a motor delivers oxygen to the body.  Ventilator. This treatment moves air into and out of the lungs by using a tube that is placed in your windpipe.  Tracheostomy. This is a procedure to create a hole in the neck so that a breathing tube can be inserted.  Extracorporeal membrane  oxygenation (ECMO). This procedure gives the lungs a chance to recover by taking over the functions of the heart and lungs. It supplies oxygen to the body and removes carbon dioxide. Follow these instructions at home: Lifestyle  If you are sick, stay home except to get medical care. Your health care provider will tell you how long to stay home. Call your health care provider before you go for medical care.  Rest at home as told by your health care provider.  Do not use any products that contain nicotine or tobacco, such as cigarettes, e-cigarettes, and chewing tobacco. If you need help quitting, ask your health care provider.  Return to your normal activities as told by your health care provider. Ask your health care provider what activities are safe for you. General  instructions  Take over-the-counter and prescription medicines only as told by your health care provider.  Drink enough fluid to keep your urine pale yellow.  Keep all follow-up visits as told by your health care provider. This is important. How is this prevented?  There is no vaccine to help prevent COVID-19 infection. However, there are steps you can take to protect yourself and others from this virus. To protect yourself:   Do not travel to areas where COVID-19 is a risk. The areas where COVID-19 is reported change often. To identify high-risk areas and travel restrictions, check the CDC travel website: wwwnc.cdc.gov/travel/notices  If you live in, or must travel to, an area where COVID-19 is a risk, take precautions to avoid infection. ? Stay away from people who are sick. ? Wash your hands often with soap and water for 20 seconds. If soap and water are not available, use an alcohol-based hand sanitizer. ? Avoid touching your mouth, face, eyes, or nose. ? Avoid going out in public, follow guidance from your state and local health authorities. ? If you must go out in public, wear a cloth face covering or face mask. Make sure your mask covers your nose and mouth. ? Avoid crowded indoor spaces. Stay at least 6 feet (2 meters) away from others. ? Disinfect objects and surfaces that are frequently touched every day. This may include:  Counters and tables.  Doorknobs and light switches.  Sinks and faucets.  Electronics, such as phones, remote controls, keyboards, computers, and tablets. To protect others: If you have symptoms of COVID-19, take steps to prevent the virus from spreading to others.  If you think you have a COVID-19 infection, contact your health care provider right away. Tell your health care team that you think you may have a COVID-19 infection.  Stay home. Leave your house only to seek medical care. Do not use public transport.  Do not travel while you are  sick.  Wash your hands often with soap and water for 20 seconds. If soap and water are not available, use alcohol-based hand sanitizer.  Stay away from other members of your household. Let healthy household members care for children and pets, if possible. If you have to care for children or pets, wash your hands often and wear a mask. If possible, stay in your own room, separate from others. Use a different bathroom.  Make sure that all people in your household wash their hands well and often.  Cough or sneeze into a tissue or your sleeve or elbow. Do not cough or sneeze into your hand or into the air.  Wear a cloth face covering or face mask. Make sure your mask covers your nose   and mouth. Where to find more information  Centers for Disease Control and Prevention: PurpleGadgets.be  World Health Organization: https://www.castaneda.info/ Contact a health care provider if:  You live in or have traveled to an area where COVID-19 is a risk and you have symptoms of the infection.  You have had contact with someone who has COVID-19 and you have symptoms of the infection. Get help right away if:  You have trouble breathing.  You have pain or pressure in your chest.  You have confusion.  You have bluish lips and fingernails.  You have difficulty waking from sleep.  You have symptoms that get worse. These symptoms may represent a serious problem that is an emergency. Do not wait to see if the symptoms will go away. Get medical help right away. Call your local emergency services (911 in the [place] Do not drive yourself to the hospital. Let the emergency medical personnel know if you think you have COVID-19. Summary  COVID-19 is a respiratory infection that is caused by a virus. It is also known as coronavirus disease or novel coronavirus. It can cause serious infections, such as pneumonia, acute respiratory distress syndrome, acute respiratory failure,  or sepsis.  The virus that causes COVID-19 is contagious. This means that it can spread from person to person through droplets from breathing, speaking, singing, coughing, or sneezing.  You are more likely to develop a serious illness if you are 56 years of age or older, have a weak immune system, live in a nursing home, or have chronic disease.  There is no medicine to treat COVID-19. Your health care provider will talk with you about ways to treat your symptoms.  Take steps to protect yourself and others from infection. Wash your hands often and disinfect objects and surfaces that are frequently touched every day. Stay away from people who are sick and wear a mask if you are sick. This information is not intended to replace advice given to you by your health care provider. Make sure you discuss any questions you have with your health care provider. Document Revised: 01/25/2019 Document Reviewed: 05/03/2018 Elsevier Patient Education  2020 [place].  COVID-19: How to Protect Yourself and Others Know how it spreads  There is currently no vaccine to prevent coronavirus disease 2019 (COVID-19).  The best way to prevent illness is to avoid being exposed to this virus.  The virus is thought to spread mainly from person-to-person. ? Between people who are in close contact with one another (within about 6 feet). ? Through respiratory droplets produced when an infected person coughs, sneezes or talks. ? These droplets can land in the mouths or noses of people who are nearby or possibly be inhaled into the lungs. ? COVID-19 may be spread by people who are not showing symptoms. Everyone should Clean your hands often  Wash your hands often with soap and water for at least 20 seconds especially after you have been in a public place, or after blowing your nose, coughing, or sneezing.  If soap and water are not readily available, use a hand sanitizer that contains at least 60% alcohol. Cover  all surfaces of your hands and rub them together until they feel dry.  Avoid touching your eyes, nose, and mouth with unwashed hands. Avoid close contact  Limit contact with others as much as possible.  Avoid close contact with people who are sick.  Put distance between yourself and other people. ? Remember that some people without symptoms may be  able to spread virus. ? This is especially important for people who are at higher risk of getting very sick.www.cdc.gov/coronavirus/2019-ncov/need-extra-precautions/people-at-higher-risk.html Cover your mouth and nose with a mask when around others  You could spread COVID-19 to others even if you do not feel sick.  Everyone should wear a mask in public settings and when around people not living in their household, especially when social distancing is difficult to maintain. ? Masks should not be placed on young children under age 2, anyone who has trouble breathing, or is unconscious, incapacitated or otherwise unable to remove the mask without assistance.  The mask is meant to protect other people in case you are infected.  Do NOT use a facemask meant for a healthcare worker.  Continue to keep about 6 feet between yourself and others. The mask is not a substitute for social distancing. Cover coughs and sneezes  Always cover your mouth and nose with a tissue when you cough or sneeze or use the inside of your elbow.  Throw used tissues in the trash.  Immediately wash your hands with soap and water for at least 20 seconds. If soap and water are not readily available, clean your hands with a hand sanitizer that contains at least 60% alcohol. Clean and disinfect  Clean AND disinfect frequently touched surfaces daily. This includes tables, doorknobs, light switches, countertops, handles, desks, phones, keyboards, toilets, faucets, and sinks. www.cdc.gov/coronavirus/2019-ncov/prevent-getting-sick/disinfecting-your-home.html  If surfaces are  dirty, clean them: Use detergent or soap and water prior to disinfection.  Then, use a household disinfectant. You can see a list of EPA-registered household disinfectants here. cdc.gov/coronavirus 12/12/2018 This information is not intended to replace advice given to you by your health care provider. Make sure you discuss any questions you have with your health care provider. Document Revised: 12/20/2018 Document Reviewed: 10/18/2018 Elsevier Patient Education  2020 [employer]   COVID-19 Frequently Asked Questions COVID-19 (coronavirus disease) is an infection that is caused by a large family of viruses. Some viruses cause illness in people and others cause illness in animals like camels, cats, and bats. In some cases, the viruses that cause illness in animals can spread to humans. Where did the coronavirus come from? In December 2019, [place] told the World Health Organization (WHO) of several cases of lung disease (human respiratory illness). These cases were linked to an open seafood and livestock market in the city of [place]. The link to the seafood and livestock market suggests that the virus may have spread from animals to humans. However, since that first outbreak in December, the virus has also been shown to spread from person to person. What is the name of the disease and the virus? Disease name Early on, this disease was called novel coronavirus. This is because scientists determined that the disease was caused by a new (novel) respiratory virus. The World Health Organization (WHO) has now named the disease COVID-19, or coronavirus disease. Virus name The virus that causes the disease is called severe acute respiratory syndrome coronavirus 2 (SARS-CoV-2). More information on disease and virus naming World Health Organization (WHO): www.who.int/emergencies/diseases/novel-coronavirus-2019/technical-guidance/naming-the-coronavirus-disease-(covid-2019)-and-the-virus-that-causes-it Who is  at risk for complications from coronavirus disease? Some people may be at higher risk for complications from coronavirus disease. This includes older adults and people who have chronic diseases, such as heart disease, diabetes, and lung disease. If you are at higher risk for complications, take these extra precautions:  Stay home as much as possible.  Avoid social gatherings and travel.  Avoid close contact with others. Stay   at least 6 ft (2 m) away from others, if possible.  Wash your hands often with soap and water for at least 20 seconds.  Avoid touching your face, mouth, nose, or eyes.  Keep supplies on hand at home, such as food, medicine, and cleaning supplies.  If you must go out in public, wear a cloth face covering or face mask. Make sure your mask covers your nose and mouth. How does coronavirus disease spread? The virus that causes coronavirus disease spreads easily from person to person (is contagious). You may catch the virus by:  Breathing in droplets from an infected person. Droplets can be spread by a person breathing, speaking, singing, coughing, or sneezing.  Touching something, like a table or a doorknob, that was exposed to the virus (contaminated) and then touching your mouth, nose, or eyes. Can I get the virus from touching surfaces or objects? There is still a lot that we do not know about the virus that causes coronavirus disease. Scientists are basing a lot of information on what they know about similar viruses, such as:  Viruses cannot generally survive on surfaces for long. They need a human body (host) to survive.  It is more likely that the virus is spread by close contact with people who are sick (direct contact), such as through: ? Shaking hands or hugging. ? Breathing in respiratory droplets that travel through the air. Droplets can be spread by a person breathing, speaking, singing, coughing, or sneezing.  It is less likely that the virus is spread  when a person touches a surface or object that has the virus on it (indirect contact). The virus may be able to enter the body if the person touches a surface or object and then touches his or her face, eyes, nose, or mouth. Can a person spread the virus without having symptoms of the disease? It may be possible for the virus to spread before a person has symptoms of the disease, but this is most likely not the main way the virus is spreading. It is more likely for the virus to spread by being in close contact with people who are sick and breathing in the respiratory droplets spread by a person breathing, speaking, singing, coughing, or sneezing. What are the symptoms of coronavirus disease? Symptoms vary from person to person and can range from mild to severe. Symptoms may include:  Fever or chills.  Cough.  Difficulty breathing or feeling short of breath.  Headaches, body aches, or muscle aches.  Runny or stuffy (congested) nose.  Sore throat.  New loss of taste or smell.  Nausea, vomiting, or diarrhea. These symptoms can appear anywhere from 2 to 14 days after you have been exposed to the virus. Some people may not have any symptoms. If you develop symptoms, call your health care provider. People with severe symptoms may need hospital care. Should I be tested for this virus? Your health care provider will decide whether to test you based on your symptoms, history of exposure, and your risk factors. How does a health care provider test for this virus? Health care providers will collect samples to send for testing. Samples may include:  Taking a swab of fluid from the back of your nose and throat, your nose, or your throat.  Taking fluid from the lungs by having you cough up mucus (sputum) into a sterile cup.  Taking a blood sample. Is there a treatment or vaccine for this virus? Currently, there is no vaccine to   prevent coronavirus disease. Also, there are no medicines like  antibiotics or antivirals to treat the virus. A person who becomes sick is given supportive care, which means rest and fluids. A person may also relieve his or her symptoms by using over-the-counter medicines that treat sneezing, coughing, and runny nose. These are the same medicines that a person takes for the common cold. If you develop symptoms, call your health care provider. People with severe symptoms may need hospital care. What can I do to protect myself and my family from this virus?     You can protect yourself and your family by taking the same actions that you would take to prevent the spread of other viruses. Take the following actions:  Wash your hands often with soap and water for at least 20 seconds. If soap and water are not available, use alcohol-based hand sanitizer.  Avoid touching your face, mouth, nose, or eyes.  Cough or sneeze into a tissue, sleeve, or elbow. Do not cough or sneeze into your hand or the air. ? If you cough or sneeze into a tissue, throw it away immediately and wash your hands.  Disinfect objects and surfaces that you frequently touch every day.  Stay away from people who are sick.  Avoid going out in public, follow guidance from your state and local health authorities.  Avoid crowded indoor spaces. Stay at least 6 ft (2 m) away from others.  If you must go out in public, wear a cloth face covering or face mask. Make sure your mask covers your nose and mouth.  Stay home if you are sick, except to get medical care. Call your health care provider before you get medical care. Your health care provider will tell you how long to stay home.  Make sure your vaccines are up to date. Ask your health care provider what vaccines you need. What should I do if I need to travel? Follow travel recommendations from your local health authority, the CDC, and WHO. Travel information and advice  Centers for Disease Control and Prevention (CDC):  BodyEditor.hu  World Health Organization [facility]): ThirdIncome.ca Know the risks and take action to protect your health  You are at higher risk of getting coronavirus disease if you are traveling to areas with an outbreak or if you are exposed to travelers from areas with an outbreak.  Wash your hands often and practice good hygiene to lower the risk of catching or spreading the virus. What should I do if I am sick? General instructions to stop the spread of infection  Wash your hands often with soap and water for at least 20 seconds. If soap and water are not available, use alcohol-based hand sanitizer.  Cough or sneeze into a tissue, sleeve, or elbow. Do not cough or sneeze into your hand or the air.  If you cough or sneeze into a tissue, throw it away immediately and wash your hands.  Stay home unless you must get medical care. Call your health care provider or local health authority before you get medical care.  Avoid public areas. Do not take public transportation, if possible.  If you can, wear a mask if you must go out of the house or if you are in close contact with someone who is not sick. Make sure your mask covers your nose and mouth. Keep your home clean  Disinfect objects and surfaces that are frequently touched every day. This may include: ? Counters and tables. ? Doorknobs and light  switches. ? Sinks and faucets. ? Electronics such as phones, remote controls, keyboards, computers, and tablets.  Wash dishes in hot, soapy water or use a dishwasher. Air-dry your dishes.  Wash laundry in hot water. Prevent infecting other household members  Let healthy household members care for children and pets, if possible. If you have to care for children or pets, wash your hands often and wear a mask.  Sleep in a different bedroom or bed, if possible.  Do not share personal items, such  as razors, toothbrushes, deodorant, combs, brushes, towels, and washcloths. Where to find more information Centers for Disease Control and Prevention (CDC)  Information and news updates: https://www.butler-gonzalez.com/ World Health Organization [facility])  Information and news updates: MissExecutive.com.ee  Coronavirus health topic: https://www.castaneda.info/  Questions and answers on COVID-19: OpportunityDebt.at  Global tracker: who.sprinklr.com American Academy of Pediatrics (AAP)  Information for families: www.healthychildren.org/English/health-issues/conditions/chest-lungs/Pages/2019-Novel-Coronavirus.aspx The coronavirus situation is changing rapidly. Check your local health authority website or the CDC and [facility] websites for updates and news. When should I contact a health care provider?  Contact your health care provider if you have symptoms of an infection, such as fever or cough, and you: ? Have been near anyone who is known to have coronavirus disease. ? Have come into contact with a person who is suspected to have coronavirus disease. ? Have traveled to an area where there is an outbreak of COVID-19. When should I get emergency medical care?  Get help right away by calling your local emergency services (911 in the [place] if you have: ? Trouble breathing. ? Pain or pressure in your chest. ? Confusion. ? Blue-tinged lips and fingernails. ? Difficulty waking from sleep. ? Symptoms that get worse. Let the emergency medical personnel know if you think you have coronavirus disease. Summary  A new respiratory virus is spreading from person to person and causing COVID-19 (coronavirus disease).  The virus that causes COVID-19 appears to spread easily. It spreads from one person to another through droplets from breathing, speaking, singing, coughing, or sneezing.  Older adults and those with chronic  diseases are at higher risk of disease. If you are at higher risk for complications, take extra precautions.  There is currently no vaccine to prevent coronavirus disease. There are no medicines, such as antibiotics or antivirals, to treat the virus.  You can protect yourself and your family by washing your hands often, avoiding touching your face, and covering your coughs and sneezes. This information is not intended to replace advice given to you by your health care provider. Make sure you discuss any questions you have with your health care provider. Document Revised: 01/25/2019 Document Reviewed: 07/24/2018 Elsevier Patient Education  [place]: Quarantine vs. Isolation QUARANTINE keeps someone who was in close contact with someone who has COVID-19 away from others. If you had close contact with a person who has COVID-19  Stay home until 14 days after your last contact.  Check your temperature twice a day and watch for symptoms of COVID-19.  If possible, stay away from people who are at higher-risk for getting very sick from COVID-19. ISOLATION keeps someone who is sick or tested positive for COVID-19 without symptoms away from others, even in their own home. If you are sick and think or know you have COVID-19  Stay home until after ? At least 10 days since symptoms first appeared and ? At least 24 hours with no fever without fever-reducing medication and ? Symptoms have improved If you  tested positive for COVID-19 but do not have symptoms  Stay home until after ? 10 days have passed since your positive test If you live with others, stay in a specific "sick room" or area and away from other people or animals, including pets. Use a separate bathroom, if available. [email or web address] 10/29/2018 This information is not intended to replace advice given to you by your health care provider. Make sure you discuss any questions you have with your health care  provider. Document Revised: 03/14/2019 Document Reviewed: 03/14/2019 Elsevier Patient Education  [place] Under Monitoring Name: Kristina [name]  Location: [place] [address]   Infection Prevention Recommendations for Individuals Confirmed to have, or Being Evaluated for, 2019 Novel Coronavirus (COVID-19) Infection Who Receive Care at Home  Individuals who are confirmed to have, or are being evaluated for, COVID-19 should follow the prevention steps below until a healthcare provider or local or state health department says they can return to normal activities.  Stay home except to get medical care You should restrict activities outside your home, except for getting medical care. Do not go to work, school, or public areas, and do not use public transportation or taxis.  Call ahead before visiting your doctor Before your medical appointment, call the healthcare provider and tell them that you have, or are being evaluated for, COVID-19 infection. This will help the healthcare provider's office take steps to keep other people from getting infected. Ask your healthcare provider to call the local or state health department.  Monitor your symptoms Seek prompt medical attention if your illness is worsening (e.g., difficulty breathing). Before going to your medical appointment, call the healthcare provider and tell them that you have, or are being evaluated for, COVID-19 infection. Ask your healthcare provider to call the local or state health department.  Wear a facemask You should wear a facemask that covers your nose and mouth when you are in the same room with other people and when you visit a healthcare provider. People who live with or visit you should also wear a facemask while they are in the same room with you.  Separate yourself from other people in your home As much as possible, you should stay in a different room from other people  in your home. Also, you should use a separate bathroom, if available.  Avoid sharing household items You should not share dishes, drinking glasses, cups, eating utensils, towels, bedding, or other items with other people in your home. After using these items, you should wash them thoroughly with soap and water.  Cover your coughs and sneezes Cover your mouth and nose with a tissue when you cough or sneeze, or you can cough or sneeze into your sleeve. Throw used tissues in a lined trash can, and immediately wash your hands with soap and water for at least 20 seconds or use an alcohol-based hand rub.  Wash your [facility] your hands often and thoroughly with soap and water for at least 20 seconds. You can use an alcohol-based hand sanitizer if soap and water are not available and if your hands are not visibly dirty. Avoid touching your eyes, nose, and mouth with unwashed hands.   Prevention Steps for Caregivers and Household Members of Individuals Confirmed to have, or Being Evaluated for, COVID-19 Infection Being Cared for in the Home  If you live with, or provide care at home for, a person confirmed to have, or being evaluated for, COVID-19  infection please follow these guidelines to prevent infection:  Follow healthcare provider's instructions Make sure that you understand and can help the patient follow any healthcare provider instructions for all care.  Provide for the patient's basic needs You should help the patient with basic needs in the home and provide support for getting groceries, prescriptions, and other personal needs.  Monitor the patient's symptoms If they are getting sicker, call his or her medical provider and tell them that the patient has, or is being evaluated for, COVID-19 infection. This will help the healthcare provider's office take steps to keep other people from getting infected. Ask the healthcare provider to call the local or state health  department.  Limit the number of people who have contact with the patient  If possible, have only one caregiver for the patient.  Other household members should stay in another home or place of residence. If this is not possible, they should stay  in another room, or be separated from the patient as much as possible. Use a separate bathroom, if available.  Restrict visitors who do not have an essential need to be in the home.  Keep older adults, very young children, and other sick people away from the patient Keep older adults, very young children, and those who have compromised immune systems or chronic health conditions away from the patient. This includes people with chronic heart, lung, or kidney conditions, diabetes, and cancer.  Ensure good ventilation Make sure that shared spaces in the home have good air flow, such as from an air conditioner or an opened window, weather permitting.  Wash your hands often  Wash your hands often and thoroughly with soap and water for at least 20 seconds. You can use an alcohol based hand sanitizer if soap and water are not available and if your hands are not visibly dirty.  Avoid touching your eyes, nose, and mouth with unwashed hands.  Use disposable paper towels to dry your hands. If not available, use dedicated cloth towels and replace them when they become wet.  Wear a facemask and gloves  Wear a disposable facemask at all times in the room and gloves when you touch or have contact with the patient's blood, body fluids, and/or secretions or excretions, such as sweat, saliva, sputum, nasal mucus, vomit, urine, or feces.  Ensure the mask fits over your nose and mouth tightly, and do not touch it during use.  Throw out disposable facemasks and gloves after using them. Do not reuse.  Wash your hands immediately after removing your facemask and gloves.  If your personal clothing becomes contaminated, carefully remove clothing and launder. Wash  your hands after handling contaminated clothing.  Place all used disposable facemasks, gloves, and other waste in a lined container before disposing them with other household waste.  Remove gloves and wash your hands immediately after handling these items.  Do not share dishes, glasses, or other household items with the patient  Avoid sharing household items. You should not share dishes, drinking glasses, cups, eating utensils, towels, bedding, or other items with a patient who is confirmed to have, or being evaluated for, COVID-19 infection.  After the person uses these items, you should wash them thoroughly with soap and water.  Wash laundry thoroughly  Immediately remove and wash clothes or bedding that have blood, body fluids, and/or secretions or excretions, such as sweat, saliva, sputum, nasal mucus, vomit, urine, or feces, on them.  Wear gloves when handling laundry from the patient.  Read and follow directions on labels of laundry or clothing items and detergent. In general, wash and dry with the warmest temperatures recommended on the label.  Clean all areas the individual has used often  Clean all touchable surfaces, such as counters, tabletops, doorknobs, bathroom fixtures, toilets, phones, keyboards, tablets, and bedside tables, every day. Also, clean any surfaces that may have blood, body fluids, and/or secretions or excretions on them.  Wear gloves when cleaning surfaces the patient has come in contact with.  Use a diluted bleach solution (e.g., dilute bleach with 1 part bleach and 10 parts water) or a household disinfectant with a label that says EPA-registered for coronaviruses. To make a bleach solution at home, add 1 tablespoon of bleach to 1 quart (4 cups) of water. For a larger supply, add  cup of bleach to 1 gallon (16 cups) of water.  Read labels of cleaning products and follow recommendations provided on product labels. Labels contain instructions for safe and  effective use of the cleaning product including precautions you should take when applying the product, such as wearing gloves or eye protection and making sure you have good ventilation during use of the product.  Remove gloves and wash hands immediately after cleaning.  Monitor yourself for signs and symptoms of illness Caregivers and household members are considered close contacts, should monitor their health, and will be asked to limit movement outside of the home to the extent possible. Follow the monitoring steps for close contacts listed on the symptom monitoring form.   ? If you have additional questions, contact your local health department or call the epidemiologist on call at [phone] (available 24/7). ? This guidance is subject to change. For the most up-to-date guidance from [facility], please refer to their website: YouBlogs.pl   Person Under Monitoring Name: Kristina [name]  Location: [place] [address]   CORONAVIRUS DISEASE 2019 (COVID-19) Guidance for Persons Under Investigation You are being tested for the virus that causes coronavirus disease 2019 (COVID-19). Public health actions are necessary to ensure protection of your health and the health of others, and to prevent further spread of infection. COVID-19 is caused by a virus that can cause symptoms, such as fever, cough, and shortness of breath. The primary transmission from person to person is by coughing or sneezing. On May 10, 2018, the [place] announced a [employer] Emergency of International Concern and on May 11, 2018 the [place]. Department of Health and Human Services declared a public health emergency. If the virus that causesCOVID-19 spreads in the community, it could have severe public health consequences.  As a person under investigation for COVID-19, the [place] advises you to adhere to the following guidance until your test results are reported to you. If your test result is positive, you will receive additional information from your provider and your local health department at that time.   Remain at home until you are cleared by your health provider or public health authorities.   Keep a log of visitors to your home using the form provided. Any visitors to your home must be aware of your isolation status.  If you plan to move to a new address or leave the county, notify the local health department in your county.  Call a doctor or seek care if you have an urgent medical need. Before seeking medical care, call ahead and get instructions from the provider before  arriving at the medical office, clinic or hospital. Notify them that you are being tested for the virus that causes COVID-19 so arrangements can be made, as necessary, to prevent transmission to others in the healthcare setting. Next, notify the local health department in your county.  If a medical emergency arises and you need to call 911, inform the first responders that you are being tested for the virus that causes COVID-19. Next, notify the local health department in your county.  Adhere to all guidance set forth by the [place] for [facility] of patients that is based on guidance from the Center for Disease Control and Prevention with suspected or confirmed COVID-19. It is provided with this guidance for Persons Under Investigation.  Your health and the health of our community are our top priorities. Public Health officials remain available to provide assistance and counseling to you about COVID-19 and compliance with this guidance.  Provider: ____________________________________________________________ Date: ______/_____/_________  By signing below, you acknowledge that you have read and agree to comply with this Guidance  for Persons Under Investigation. ______________________________________________________________ Date: ______/_____/_________  WHO DO I CALL? You can find a list of local health departments here: [email or web address] Health Department: ____________________________________________________________________ Contact Name: ________________________________________________________________________ Telephone: ___________________________________________________________________________  [facility], [place], Communicable Disease Branch COVID-19 Guidance for Persons Under Investigation March 7, 2020Information on my medicine - XARELTO (rivaroxaban)  This medication education was reviewed with me or my healthcare representative as part of my discharge preparation.  The pharmacist that spoke with me during my hospital stay was:  Henri Medal, [place]? Xarelto was prescribed to treat blood clots that may have been found in the veins of your legs (deep vein thrombosis) or in your lungs (pulmonary embolism) and to reduce the risk of them occurring again.  What do you need to know about Xarelto? The starting dose is one 15 mg tablet taken TWICE daily with food for the FIRST 21 DAYS then on 1/23/201  the dose is changed to one 20 mg tablet taken ONCE A DAY with your evening meal.  DO NOT stop taking Xarelto without talking to the health care provider who prescribed the medication.  Refill your prescription for 20 mg tablets before you run out.  After discharge, you should have regular check-up appointments with your healthcare provider that is prescribing your Xarelto.  In the future your dose may need to be changed if your kidney function changes by a significant amount.  What do you do if you miss a dose? If you are taking Xarelto TWICE DAILY and you miss a dose, take it as soon as you remember.  You may take two 15 mg tablets (total 30 mg) at the same time then resume your regularly scheduled 15 mg twice daily the next day.  If you are taking Xarelto ONCE DAILY and you miss a dose, take it as soon as you remember on the same day then continue your regularly scheduled once daily regimen the next day. Do not take two doses of Xarelto at the same time.   Important Safety Information Xarelto is a blood thinner medicine that can cause bleeding. You should call your healthcare provider right away if you experience any of the following: ? Bleeding from an injury or your nose that does not stop. ? Unusual colored urine (red or dark brown) or unusual colored stools (red or black). ? Unusual bruising for unknown reasons. ?  A serious fall or if you hit your head (even if there is no bleeding).  Some medicines may interact with Xarelto and might increase your risk of bleeding while on Xarelto. To help avoid this, consult your healthcare provider or pharmacist prior to using any new prescription or non-prescription medications, including herbals, vitamins, non-steroidal anti-inflammatory drugs (NSAIDs) and supplements.  This website has more information on Xarelto: [email or web address]-[email or web address]

## 2019-04-16 NOTE — Discharge Summary (Addendum)
Discharge Summary  Kristina [name] [id number] DOB: July 15, 1945  PCP: Nolene Ebbs, MD  Admit date: 04/10/2019 Discharge date: 04/16/2019  Time spent: 35 minutes  Recommendations for Outpatient Follow-up:  1. Follow-up with your primary care provider 2. Follow-up with cardiology within a week due to changes in your cardiac medications and for possible need of adjustment of your medications. 3. Take your medications as prescribed 4. Quarantine for 21 days from your positive COVID-19 test on 04/10/19 through May 01, 2019.  Discharge Diagnoses:  Active Hospital Problems   Diagnosis Date Noted   Pulmonary embolism and infarction [facility]) 04/10/2019   Hypokalemia 01/07/2018   Dyslipidemia 07/12/2016   CAD S/P percutaneous coronary angioplasty    Benign essential HTN 06/22/2016    Resolved Hospital Problems  No resolved problems to display.    Discharge Condition: Stable  Diet recommendation: Heart healthy diet.  Vitals:   04/15/19 2352 04/16/19 0503  BP: 131/81 118/69  Pulse: (!) 53   Resp: 18   Temp: 98.3 F (36.8 C) 98.3 F (36.8 C)  SpO2: 97%     History of present illness:  74 year old female with history of seizure disorder, hypertension, carpal tunnel syndrome, childhood asthma presents with 3 days history of cough and malaise. COVID-19 was positive in the ED on 04/10/2019. CTA of the chest showed pulmonary embolism. Patient admitted with diagnosis of PE in the setting of COVID-19 viral infection.  Inflammatory markers were elevated.  She was started on Covid-19 directed therapies.  She completed 5 days of IV remdesivir.  She will complete 10 days of p.o. Decadron 6 mg daily.  Hospital course complicated by sinus bradycardia for which Norvasc and metoprolol were held.  Advised to have a close follow-up appointment with her cardiologist within a week due to adjustment of her cardiac medications.  She agrees.  04/16/19: Seen and examined.  No acute events  overnight.  She has no new complaints.  Vital signs and labs reviewed and are stable.  Heart rate in the mid 50s off beta-blocker and off calcium channel blocker.  She is asymptomatic.  Blood pressure 118/69 on losartan 50 mg daily and p.o. Lasix 20 mg daily.  Patient advised to follow-up with her primary care provider and cardiologist posthospitalization.  Patient understands and agrees to plan.     Hospital Course:  Principal Problem:   Pulmonary embolism and infarction [facility]) Active Problems:   Benign essential HTN   CAD S/P percutaneous coronary angioplasty   Dyslipidemia   Hypokalemia   Acute COVID-19 viral pneumonia Completed 5 days of remdesivir  We will complete 10 days of Decadron p.o. 6 mg daily.   Continue albuterol inhaler twice daily as needed for shortness of breath and wheezing.   O2 saturation 99% on room air. Continue vitamin D3, vitamin C and zinc. Follow-up with your PCP posthospitalization  Segmental PE switched to Xarelto from heparin drip, continue Vital signs are stable. Continue Xarelto Follow-up with your PCP  Chronic diastolic CHF Last 2D echo done in 2011 showed normal LVEF with grade 1 diastolic dysfunction Continue p.o. losartan 50 mg daily and p.o. Lasix 20 mg daily. Beta-blocker has been held due to bradycardia Advised to follow-up with her cardiologist Dr. Gwenlyn Found within a week She understands and agrees to plan.  Resolved post repletion: Hypokalemia Potassium 3.3>> 3.8 Magnesium 2.0 on 04/11/2019.  Sinus bradycardia in the setting of use of beta-blocker and calcium channel blocker Twelve-lead EKG done on 04/15/2019 showed sinus bradycardia with rate of 49 and  QTC of 413.  Heart rate improved off beta-blocker and off calcium channel blocker to mid 50s.  Asymptomatic. Follow-up with cardiology.  Resolved transient uncontrolled hypertension Likely contributed by normal saline IV fluid hydration, discontinued on 04/14/2019. Blood pressure is  currently at goal off IV fluids.  Coronary artery disease status post percutaneous coronary angioplasty Denies any anginal symptoms. She is on aspirin and Plavix as well as Xarelto for PE No bleeding.  Hemoglobin trending up to 11.8 on 04/15/2019. Continue Lipitor 80 mg daily Defer to cardiology to adjust medications Advised to follow-up with cardiology within 1 week  Right hilar Lymph node enlargement Follow-up CT recommended in 3 months to ensure resolution of the findings  DVT prophylaxis: Xarelto.  Code Status:Full code   Discharge Exam: BP 118/69    Pulse (!) 53    Temp 98.3 F (36.8 C)    Resp 18    Ht 5\' 6"  (1.676 m)    Wt 93.7 kg    SpO2 97%    BMI 33.34 kg/m   General: 74 y.o. year-old female well developed well nourished in no acute distress.  Alert and oriented x3.  Cardiovascular: Regular rate and rhythm with no rubs or gallops.  No thyromegaly or JVD noted.    Respiratory: Clear to auscultation with no wheezes or rales. Good inspiratory effort.  Abdomen: Soft nontender nondistended with normal bowel sounds x4 quadrants.  Musculoskeletal: No lower extremity edema. 2/4 pulses in all 4 extremities.  Psychiatry: Mood is appropriate for condition and setting  Discharge Instructions You were cared for by a hospitalist during your hospital stay. If you have any questions about your discharge medications or the care you received while you were in the hospital after you are discharged, you can call the unit and asked to speak with the hospitalist on call if the hospitalist that took care of you is not available. Once you are discharged, your primary care physician will handle any further medical issues. Please note that NO REFILLS for any discharge medications will be authorized once you are discharged, as it is imperative that you return to your primary care physician (or establish a relationship with a primary care physician if you do not have one) for your aftercare  needs so that they can reassess your need for medications and monitor your lab values.  Discharge Instructions    MyChart COVID-19 home monitoring program   Complete by: Apr 16, 2019    Is the patient willing to use the [facility] for home monitoring?: Yes   Temperature monitoring   Complete by: Apr 16, 2019    After how many days would you like to receive a notification of this patient's flowsheet entries?: 1     Allergies as of 04/16/2019   No Known Allergies     Medication List    STOP taking these medications   acetaminophen 325 MG tablet Commonly known as: TYLENOL   amLODipine 5 MG tablet Commonly known as: NORVASC   bacitracin ointment   methocarbamol 500 MG tablet Commonly known as: ROBAXIN   metoprolol tartrate 25 MG tablet Commonly known as: LOPRESSOR     TAKE these medications   albuterol 108 (90 Base) MCG/ACT inhaler Commonly known as: VENTOLIN HFA Inhale 2 puffs into the lungs 2 (two) times daily as needed for wheezing or shortness of breath.   ascorbic acid 500 MG tablet Commonly known as: VITAMIN C Take 1 tablet (500 mg total) by mouth daily. Start taking on:  April 17, 2019   aspirin 81 MG chewable tablet Chew 1 tablet (81 mg total) by mouth daily.   atorvastatin 80 MG tablet Commonly known as: LIPITOR Take 1 tablet (80 mg total) by mouth daily at 6 PM.   clopidogrel 75 MG tablet Commonly known as: PLAVIX TAKE 1 TABLET BY MOUTH  DAILY   dexamethasone 6 MG tablet Commonly known as: DECADRON Take 1 tablet (6 mg total) by mouth daily for 5 days. Start taking on: April 17, 2019   furosemide 20 MG tablet Commonly known as: LASIX Take 20 mg by mouth daily.   losartan 50 MG tablet Commonly known as: COZAAR Take 50 mg by mouth daily.   nitroGLYCERIN 0.4 MG SL tablet Commonly known as: NITROSTAT Place 1 tablet (0.4 mg total) under the tongue every 5 (five) minutes as needed for chest pain.   Rivaroxaban 15 & 20 MG Tbpk Follow  package directions: Take one 15mg  tablet by mouth twice a day. On day 22, switch to one 20mg  tablet once a day. Take with food.   Vitamin D3 25 MCG tablet Commonly known as: Vitamin D Take 5 tablets (5,000 Units total) by mouth daily. Start taking on: April 17, 2019 What changed:   medication strength  how much to take   zinc sulfate 220 (50 Zn) MG capsule Take 1 capsule (220 mg total) by mouth daily. Start taking on: April 17, 2019      No Known Allergies Follow-up Information    Nolene Ebbs, MD. Call in 1 day(s).   Specialty: Internal Medicine Why: Please call for a post hospital follow-up appointment. Contact information: [place] [address] [phone]        [name], MD .   Specialties: Cardiology, Radiology Contact information: [address] [place] [address] [phone]            The results of significant diagnostics from this hospitalization (including imaging, microbiology, ancillary and laboratory) are listed below for reference.    Significant Diagnostic Studies: CT Head Wo Contrast  Result Date: 04/10/2019 CLINICAL DATA:  74 year old female with history of recurrent syncope. EXAM: CT HEAD WITHOUT CONTRAST TECHNIQUE: Contiguous axial images were obtained from the base of the skull through the vertex without intravenous contrast. COMPARISON:  Head CT 01/04/2018. FINDINGS: Brain: No evidence of acute infarction, hemorrhage, hydrocephalus, extra-axial collection or mass lesion/mass effect. Vascular: No hyperdense vessel or unexpected calcification. Skull: Normal. Area of irregular sclerosis and lucency in the right frontal bone superior and lateral to the right orbit, stable dating back to 2012, presumably fibrous dysplasia. Sinuses/Orbits: No acute finding. Other: None. IMPRESSION: 1. No acute intracranial abnormalities. The appearance of the brain is normal. 2. Right frontal bone fibrous dysplasia  redemonstrated. Electronically Signed   By: Vinnie Langton M.D.   On: 04/10/2019 18:05   CT Angio Chest PE W and/or Wo Contrast  Result Date: 04/10/2019 CLINICAL DATA:  73 year old female with history of recurrent syncope. Weakness. Evaluate for pulmonary embolism. EXAM: CT ANGIOGRAPHY CHEST WITH CONTRAST TECHNIQUE: Multidetector CT imaging of the chest was performed using the standard protocol during bolus administration of intravenous contrast. Multiplanar CT image reconstructions and MIPs were obtained to evaluate the vascular anatomy. CONTRAST:  172mL OMNIPAQUE IOHEXOL 350 MG/ML SOLN COMPARISON:  Chest CT 01/04/2018. FINDINGS: Cardiovascular: Small nonobstructive filling defect in a right lower lobe segmental sized pulmonary artery branch (axial images 145-149 of series 5). No other larger central or lobar sized filling defect. Heart  size is normal. There is no significant pericardial fluid, thickening or pericardial calcification. There is aortic atherosclerosis, as well as atherosclerosis of the great vessels of the mediastinum and the coronary arteries, including calcified atherosclerotic plaque in the left anterior descending and right coronary arteries. Thickening calcification of the aortic valve. Mediastinum/Nodes: Mildly enlarged right hilar lymph node measuring 1.5 cm in short axis (axial image 117 of series 5). Borderline enlarged left hilar lymph node measuring 1 cm in short axis. No pathologically enlarged mediastinal lymph nodes. Esophagus is unremarkable in appearance. No axillary lymphadenopathy. Lungs/Pleura: Mild consolidation and volume loss in the left upper lobe. No pleural effusions. No suspicious appearing pulmonary nodules or masses are noted. Upper Abdomen: Unremarkable. Musculoskeletal: There are no aggressive appearing lytic or blastic lesions noted in the visualized portions of the skeleton. Review of the MIP images confirms the above findings. IMPRESSION: 1. Tiny nonobstructive  filling defect in a right lower lobe segmental sized pulmonary artery branch, concerning for pulmonary embolism. However, the possibility of some beam hardening artifact in this region is not excluded. No other larger central or lobar filling defect noted. 2. Mildly enlarged right hilar lymph node measuring 1.5 cm in short axis. Borderline enlarged left hilar lymph node. These are nonspecific, but attention on follow-up chest CT is recommended in 3 months to ensure the the stability or resolution of these findings. This should preferentially be performed as a PE protocol CT scan to reassess the potential right lower lobe pulmonary embolism. 3. Small amount of airspace consolidation and atelectasis in the left upper lobe concerning for pneumonia. 4. Aortic atherosclerosis, in addition to 2 vessel coronary artery disease. Assessment for potential risk factor modification, dietary therapy or pharmacologic therapy may be warranted, if clinically indicated. 5. There are calcifications of the aortic valve. Echocardiographic correlation for evaluation of potential valvular dysfunction may be warranted if clinically indicated. Aortic Atherosclerosis (ICD10-I70.0). Electronically Signed   By: Vinnie Langton M.D.   On: 04/10/2019 18:27   CXR Portable same day  Result Date: 04/11/2019 CLINICAL DATA:  COVID-19 EXAM: PORTABLE CHEST 1 VIEW COMPARISON:  01/04/2018 FINDINGS: There is scattered airspace opacities bilaterally most evident at the left lower lung zone. The lung volumes are somewhat low. The heart size is unremarkable. There is no pneumothorax or large pleural effusion. IMPRESSION: Bilateral airspace opacities consistent with the patient's history of viral pneumonia. Electronically Signed   By: Constance Holster M.D.   On: 04/11/2019 15:42    Microbiology: Recent Results (from the past 240 hour(s))  Blood Culture (routine x 2)     Status: None   Collection Time: 04/10/19  7:18 PM   Specimen: BLOOD    Result Value Ref Range Status   Specimen Description BLOOD SITE NOT SPECIFIED  Final   Special Requests   Final    BOTTLES DRAWN AEROBIC AND ANAEROBIC Blood Culture results may not be optimal due to an inadequate volume of blood received in culture bottles   Culture   Final    NO GROWTH 5 DAYS Performed at [facility], [place] [address], [place], [place] [address]    Report Status 04/15/2019 FINAL  Final  Blood Culture (routine x 2)     Status: None   Collection Time: 04/11/19  6:31 AM   Specimen: BLOOD  Result Value Ref Range Status   Specimen Description BLOOD LEFT ANTECUBITAL  Final   Special Requests   Final    BOTTLES DRAWN AEROBIC ONLY Blood Culture adequate volume  Culture   Final    NO GROWTH 5 DAYS Performed at [facility], [place] [address] [place], [place] [address]    Report Status 04/16/2019 FINAL  Final     Labs: Basic Metabolic Panel: Recent Labs  Lab 04/11/19 0624 04/11/19 1126 04/12/19 0255 04/13/19 0413 04/14/19 0326 04/15/19 0358  NA 138  --  142 142 142 141  K 3.0*  --  3.7 4.6 3.3* 3.8  CL 102  --  107 107 106 106  CO2 26  --  26 26 27 27   GLUCOSE 129*  --  116* 98 108* 112*  BUN 8  --  10 14 14 13   CREATININE 1.03*  --  0.82 0.82 0.72 0.81  CALCIUM 8.4*  --  8.8* 8.5* 8.7* 9.0  MG  --  2.0  --   --   --   --    Liver Function Tests: Recent Labs  Lab 04/11/19 0624 04/12/19 0255 04/13/19 0413 04/14/19 0326 04/15/19 0358  AST 18 15 33 16 28  ALT 12 13 11 15 25   ALKPHOS 67 60 61 65 71  BILITOT 0.5 0.6 1.5* 0.6 0.5  PROT 6.2* 6.0* 5.6* 5.8* 6.3*  ALBUMIN 3.2* 3.1* 2.9* 2.9* 3.0*   No results for input(s): LIPASE, AMYLASE in the last 168 hours. No results for input(s): AMMONIA in the last 168 hours. CBC: Recent Labs  Lab 04/11/19 0624 04/12/19 0255 04/13/19 0413 04/14/19 0326 04/15/19 0358  WBC 4.0 3.3* 4.7 4.6 4.9  NEUTROABS 2.0 1.7 2.9 2.6 2.6  HGB 12.1 11.1* 10.4* 10.6* 11.8*  HCT 36.9 34.4* 32.5* 33.2* 35.7*  MCV  92.9 92.7 93.7 94.3 92.5  PLT 168 178 200 205 238   Cardiac Enzymes: No results for input(s): CKTOTAL, CKMB, CKMBINDEX, TROPONINI in the last 168 hours. BNP: BNP (last 3 results) Recent Labs    04/10/19 1500  BNP 9.7    ProBNP (last 3 results) No results for input(s): PROBNP in the last 8760 hours.  CBG: No results for input(s): GLUCAP in the last 168 hours.     Signed:  Kayleen Memos, MD [facility] 04/16/2019, 1:58 PM

## 2019-04-16 NOTE — Plan of Care (Signed)
Problem: Education: Goal: Knowledge of risk factors and measures for prevention of condition will improve 04/16/2019 0100 by Harl Bowie, RN Outcome: Progressing 04/16/2019 0059 by Harl Bowie, RN Outcome: Progressing   Problem: Coping: Goal: Psychosocial and spiritual needs will be supported 04/16/2019 0100 by Harl Bowie, RN Outcome: Progressing 04/16/2019 0059 by Harl Bowie, RN Outcome: Progressing   Problem: Respiratory: Goal: Will maintain a patent airway 04/16/2019 0100 by Harl Bowie, RN Outcome: Progressing 04/16/2019 0059 by Harl Bowie, RN Outcome: Progressing Goal: Complications related to the disease process, condition or treatment will be avoided or minimized 04/16/2019 0100 by Harl Bowie, RN Outcome: Progressing 04/16/2019 0059 by Harl Bowie, RN Outcome: Progressing   Problem: Education: Goal: Knowledge of General Education information will improve Description: Including pain rating scale, medication(s)/side effects and non-pharmacologic comfort measures 04/16/2019 0100 by Harl Bowie, RN Outcome: Progressing 04/16/2019 0059 by Harl Bowie, RN Outcome: Progressing   Problem: Health Behavior/Discharge Planning: Goal: Ability to manage health-related needs will improve 04/16/2019 0100 by Harl Bowie, RN Outcome: Progressing 04/16/2019 0059 by Harl Bowie, RN Outcome: Progressing   Problem: Clinical Measurements: Goal: Ability to maintain clinical measurements within normal limits will improve 04/16/2019 0100 by Harl Bowie, RN Outcome: Progressing 04/16/2019 0059 by Harl Bowie, RN Outcome: Progressing Goal: Will remain free from infection 04/16/2019 0100 by Harl Bowie, RN Outcome: Progressing 04/16/2019 0059 by Harl Bowie, RN Outcome: Progressing Goal: Diagnostic test results will improve 04/16/2019 0100 by Harl Bowie, RN Outcome: Progressing 04/16/2019 0059 by Harl Bowie, RN Outcome: Progressing Goal: Respiratory complications will improve 04/16/2019 0100 by Harl Bowie, RN Outcome: Progressing 04/16/2019 0059 by Harl Bowie, RN Outcome: Progressing Goal: Cardiovascular complication will be avoided 04/16/2019 0100 by Harl Bowie, RN Outcome: Progressing 04/16/2019 0059 by Harl Bowie, RN Outcome: Progressing   Problem: Activity: Goal: Risk for activity intolerance will decrease 04/16/2019 0100 by Harl Bowie, RN Outcome: Progressing 04/16/2019 0059 by Harl Bowie, RN Outcome: Progressing   Problem: Nutrition: Goal: Adequate nutrition will be maintained 04/16/2019 0100 by Harl Bowie, RN Outcome: Progressing 04/16/2019 0059 by Harl Bowie, RN Outcome: Progressing   Problem: Coping: Goal: Level of anxiety will decrease 04/16/2019 0100 by Harl Bowie, RN Outcome: Progressing 04/16/2019 0059 by Harl Bowie, RN Outcome: Progressing   Problem: Elimination: Goal: Will not experience complications related to bowel motility 04/16/2019 0100 by Harl Bowie, RN Outcome: Progressing 04/16/2019 0059 by Harl Bowie, RN Outcome: Progressing Goal: Will not experience complications related to urinary retention 04/16/2019 0100 by Harl Bowie, RN Outcome: Progressing 04/16/2019 0059 by Harl Bowie, RN Outcome: Progressing   Problem: Pain Managment: Goal: General experience of comfort will improve 04/16/2019 0100 by Harl Bowie, RN Outcome: Progressing 04/16/2019 0059 by Harl Bowie, RN Outcome: Progressing   Problem: Safety: Goal: Ability to remain free from injury will improve 04/16/2019 0100 by Harl Bowie, RN Outcome: Progressing 04/16/2019 0059 by Harl Bowie, RN Outcome: Progressing   Problem: Skin Integrity: Goal: Risk for impaired skin integrity will decrease 04/16/2019 0100 by Harl Bowie, RN Outcome: Progressing 04/16/2019 0059 by Harl Bowie,  RN Outcome: Progressing   Problem: Education: Goal: Knowledge of General Education information will improve Description: Including pain rating scale, medication(s)/side effects and non-pharmacologic comfort measures Outcome: Progressing   Problem: Health Behavior/Discharge Planning: Goal: Ability to manage health-related needs  will improve Outcome: Progressing   Problem: Clinical Measurements: Goal: Ability to maintain clinical measurements within normal limits will improve Outcome: Progressing Goal: Will remain free from infection Outcome: Progressing Goal: Diagnostic test results will improve Outcome: Progressing Goal: Respiratory complications will improve Outcome: Progressing Goal: Cardiovascular complication will be avoided Outcome: Progressing   Problem: Activity: Goal: Risk for activity intolerance will decrease Outcome: Progressing   Problem: Nutrition: Goal: Adequate nutrition will be maintained Outcome: Progressing   Problem: Coping: Goal: Level of anxiety will decrease Outcome: Progressing   Problem: Elimination: Goal: Will not experience complications related to bowel motility Outcome: Progressing Goal: Will not experience complications related to urinary retention Outcome: Progressing   Problem: Pain Managment: Goal: General experience of comfort will improve Outcome: Progressing   Problem: Safety: Goal: Ability to remain free from injury will improve Outcome: Progressing   Problem: Skin Integrity: Goal: Risk for impaired skin integrity will decrease Outcome: Progressing

## 2019-04-16 NOTE — Plan of Care (Signed)
Pt discharged to family at 76. No issues noted.

## 2019-04-16 NOTE — Progress Notes (Signed)
   Vital Signs MEWS/VS Documentation      04/15/2019 2114 04/15/2019 2115 04/15/2019 2152 04/15/2019 2352   MEWS Score:  0  0  0  0   MEWS Score Color:  Green  Green  Green  Green   Resp:  18  -  18  18   Pulse:  -  -  -  (!) 53   BP:  124/72  -  138/80  131/81   Temp:  98.5 F (36.9 C)  -  98.2 F (36.8 C)  98.3 F (36.8 C)   O2 Device:  Room Air  -  -  Room Air   Level of Consciousness:  -  Alert  -  Alert           Harl Bowie 04/16/2019,12:51 AM

## 2019-04-16 NOTE — TOC Transition Note (Signed)
Transition of Care [facility]) - CM/SW Discharge Note   Patient Details  Name: Kristina [name] MRN: [id number] Date of Birth: 07/15/1945  [facility] CM/SW Contact:  Maryclare Labrador, RN Phone Number: 04/16/2019, 2:14 PM   Clinical Narrative:   PTA independent from home with family.  Pts son will transport her home at discharge.  Pt has PCP and denied barriers with paying for discharge medications.  CM submitted benefit check however TOC will verifiy 20mg  daily dose prior to discharge and inform CM/pt as benefit check documented does not list specific dose on drug.  Pt will get first month supply of Xarelto free supplied by [facility] pharmacy.  TOC pharmacy will fill discharge medications.  Pt informed CM that if medications cause hardship she will immediately reach out to PCP.  NO other CM needs determined - discharge order signed - CM signing off    Final next level of care: Home/Self Care Barriers to Discharge: Barriers Resolved   Patient Goals and CMS Choice        Discharge Placement                       Discharge Plan and Services                                     Social Determinants of Health (SDOH) Interventions     Readmission Risk Interventions No flowsheet data found.

## 2019-04-25 ENCOUNTER — Inpatient Hospital Stay (HOSPITAL_COMMUNITY)
Admission: EM | Admit: 2019-04-25 | Discharge: 2019-04-29 | DRG: 374 | Disposition: A | Discharge status: Home / Self Care | Payer: 59 | Attending: Internal Medicine | Admitting: Internal Medicine

## 2019-04-25 ENCOUNTER — Other Ambulatory Visit: Payer: Self-pay

## 2019-04-25 DIAGNOSIS — K921 Melena: Secondary | ICD-10-CM

## 2019-04-25 DIAGNOSIS — D509 Iron deficiency anemia, unspecified: Secondary | ICD-10-CM | POA: Diagnosis present

## 2019-04-25 DIAGNOSIS — Z955 Presence of coronary angioplasty implant and graft: Secondary | ICD-10-CM

## 2019-04-25 DIAGNOSIS — I951 Orthostatic hypotension: Secondary | ICD-10-CM | POA: Diagnosis present

## 2019-04-25 DIAGNOSIS — Z7982 Long term (current) use of aspirin: Secondary | ICD-10-CM

## 2019-04-25 DIAGNOSIS — Z7902 Long term (current) use of antithrombotics/antiplatelets: Secondary | ICD-10-CM

## 2019-04-25 DIAGNOSIS — U071 COVID-19: Secondary | ICD-10-CM | POA: Diagnosis not present

## 2019-04-25 DIAGNOSIS — C187 Malignant neoplasm of sigmoid colon: Principal | ICD-10-CM

## 2019-04-25 DIAGNOSIS — Z8249 Family history of ischemic heart disease and other diseases of the circulatory system: Secondary | ICD-10-CM

## 2019-04-25 DIAGNOSIS — D62 Acute posthemorrhagic anemia: Secondary | ICD-10-CM | POA: Diagnosis present

## 2019-04-25 DIAGNOSIS — Z823 Family history of stroke: Secondary | ICD-10-CM

## 2019-04-25 DIAGNOSIS — I2510 Atherosclerotic heart disease of native coronary artery without angina pectoris: Secondary | ICD-10-CM | POA: Diagnosis present

## 2019-04-25 DIAGNOSIS — I110 Hypertensive heart disease with heart failure: Secondary | ICD-10-CM | POA: Diagnosis present

## 2019-04-25 DIAGNOSIS — Z885 Allergy status to narcotic agent status: Secondary | ICD-10-CM

## 2019-04-25 DIAGNOSIS — Z86711 Personal history of pulmonary embolism: Secondary | ICD-10-CM

## 2019-04-25 DIAGNOSIS — K625 Hemorrhage of anus and rectum: Secondary | ICD-10-CM

## 2019-04-25 DIAGNOSIS — Z7901 Long term (current) use of anticoagulants: Secondary | ICD-10-CM

## 2019-04-25 DIAGNOSIS — R55 Syncope and collapse: Secondary | ICD-10-CM | POA: Diagnosis present

## 2019-04-25 DIAGNOSIS — R195 Other fecal abnormalities: Secondary | ICD-10-CM | POA: Diagnosis not present

## 2019-04-25 DIAGNOSIS — Z79899 Other long term (current) drug therapy: Secondary | ICD-10-CM

## 2019-04-25 DIAGNOSIS — Z6831 Body mass index (BMI) 31.0-31.9, adult: Secondary | ICD-10-CM

## 2019-04-25 DIAGNOSIS — Z8616 Personal history of COVID-19: Secondary | ICD-10-CM

## 2019-04-25 DIAGNOSIS — K2901 Acute gastritis with bleeding: Secondary | ICD-10-CM

## 2019-04-25 DIAGNOSIS — R590 Localized enlarged lymph nodes: Secondary | ICD-10-CM | POA: Diagnosis present

## 2019-04-25 DIAGNOSIS — I252 Old myocardial infarction: Secondary | ICD-10-CM

## 2019-04-25 DIAGNOSIS — Z90721 Acquired absence of ovaries, unilateral: Secondary | ICD-10-CM

## 2019-04-25 DIAGNOSIS — E785 Hyperlipidemia, unspecified: Secondary | ICD-10-CM | POA: Diagnosis present

## 2019-04-25 DIAGNOSIS — K648 Other hemorrhoids: Secondary | ICD-10-CM | POA: Diagnosis present

## 2019-04-25 DIAGNOSIS — I5032 Chronic diastolic (congestive) heart failure: Secondary | ICD-10-CM | POA: Diagnosis present

## 2019-04-25 DIAGNOSIS — E669 Obesity, unspecified: Secondary | ICD-10-CM | POA: Diagnosis present

## 2019-04-25 DIAGNOSIS — K922 Gastrointestinal hemorrhage, unspecified: Secondary | ICD-10-CM | POA: Diagnosis present

## 2019-04-25 DIAGNOSIS — Z801 Family history of malignant neoplasm of trachea, bronchus and lung: Secondary | ICD-10-CM

## 2019-04-25 DIAGNOSIS — Z90710 Acquired absence of both cervix and uterus: Secondary | ICD-10-CM

## 2019-04-25 LAB — TYPE AND SCREEN
ABO/RH(D): O POS
Antibody Screen: NEGATIVE

## 2019-04-25 LAB — RETICULOCYTES
Immature Retic Fract: 9.3 % (ref 2.3–15.9)
RBC.: 3.63 MIL/uL — ABNORMAL LOW (ref 3.87–5.11)
Retic Count, Absolute: 47.9 10*3/uL (ref 19.0–186.0)
Retic Ct Pct: 1.3 % (ref 0.4–3.1)

## 2019-04-25 LAB — CBC WITH DIFFERENTIAL/PLATELET
Abs Immature Granulocytes: 0.04 10*3/uL (ref 0.00–0.07)
Basophils Absolute: 0 10*3/uL (ref 0.0–0.1)
Basophils Relative: 0 %
Eosinophils Absolute: 0 10*3/uL (ref 0.0–0.5)
Eosinophils Relative: 1 %
HCT: 34.3 % — ABNORMAL LOW (ref 36.0–46.0)
Hemoglobin: 10.7 g/dL — ABNORMAL LOW (ref 12.0–15.0)
Immature Granulocytes: 1 %
Lymphocytes Relative: 21 %
Lymphs Abs: 1.5 10*3/uL (ref 0.7–4.0)
MCH: 30.1 pg (ref 26.0–34.0)
MCHC: 31.2 g/dL (ref 30.0–36.0)
MCV: 96.6 fL (ref 80.0–100.0)
Monocytes Absolute: 0.4 10*3/uL (ref 0.1–1.0)
Monocytes Relative: 6 %
Neutro Abs: 5.5 10*3/uL (ref 1.7–7.7)
Neutrophils Relative %: 71 %
Platelets: 248 10*3/uL (ref 150–400)
RBC: 3.55 MIL/uL — ABNORMAL LOW (ref 3.87–5.11)
RDW: 14.6 % (ref 11.5–15.5)
WBC: 7.5 10*3/uL (ref 4.0–10.5)
nRBC: 0 % (ref 0.0–0.2)

## 2019-04-25 LAB — COMPREHENSIVE METABOLIC PANEL
ALT: 21 U/L (ref 0–44)
AST: 17 U/L (ref 15–41)
Albumin: 2.6 g/dL — ABNORMAL LOW (ref 3.5–5.0)
Alkaline Phosphatase: 55 U/L (ref 38–126)
Anion gap: 11 (ref 5–15)
BUN: 19 mg/dL (ref 8–23)
CO2: 26 mmol/L (ref 22–32)
Calcium: 8.6 mg/dL — ABNORMAL LOW (ref 8.9–10.3)
Chloride: 102 mmol/L (ref 98–111)
Creatinine, Ser: 0.95 mg/dL (ref 0.44–1.00)
GFR calc Af Amer: >60 mL/min (ref >60)
GFR calc non Af Amer: >60 mL/min (ref >60)
Glucose, Bld: 108 mg/dL — ABNORMAL HIGH (ref 70–99)
Potassium: 3.6 mmol/L (ref 3.5–5.1)
Sodium: 139 mmol/L (ref 135–145)
Total Bilirubin: 1 mg/dL (ref 0.3–1.2)
Total Protein: 5.9 g/dL — ABNORMAL LOW (ref 6.5–8.1)

## 2019-04-25 LAB — HEMOGLOBIN AND HEMATOCRIT, BLOOD
HCT: 34.6 % — ABNORMAL LOW (ref 36.0–46.0)
HCT: 31.5 % — ABNORMAL LOW (ref 36.0–46.0)
Hemoglobin: 10.8 g/dL — ABNORMAL LOW (ref 12.0–15.0)
Hemoglobin: 10.3 g/dL — ABNORMAL LOW (ref 12.0–15.0)

## 2019-04-25 LAB — IRON AND TIBC
Iron: 52 ug/dL (ref 28–170)
Saturation Ratios: 18 % (ref 10.4–31.8)
TIBC: 295 ug/dL (ref 250–450)
UIBC: 243 ug/dL

## 2019-04-25 LAB — CBG MONITORING, ED: Glucose-Capillary: 98 mg/dL (ref 70–99)

## 2019-04-25 LAB — POC OCCULT BLOOD, ED: Fecal Occult Bld: POSITIVE — AB

## 2019-04-25 MED ORDER — PANTOPRAZOLE SODIUM 40 MG IV SOLR
40.0000 mg | Freq: Once | INTRAVENOUS | Status: AC
  Administered 2019-04-25: 40 mg via INTRAVENOUS
  Filled 2019-04-25: qty 40

## 2019-04-25 MED ORDER — LOSARTAN POTASSIUM 50 MG PO TABS
50.0000 mg | ORAL_TABLET | Freq: Every day | ORAL | Status: DC
  Administered 2019-04-26 – 2019-04-29 (×4): 50 mg via ORAL
  Filled 2019-04-25 (×4): qty 1

## 2019-04-25 MED ORDER — NITROGLYCERIN 0.4 MG SL SUBL: 0.4000 mg | SUBLINGUAL_TABLET | SUBLINGUAL | Status: DC | PRN

## 2019-04-25 MED ORDER — SODIUM CHLORIDE 0.9 % IV BOLUS
1000.0000 mL | Freq: Once | INTRAVENOUS | Status: AC
  Administered 2019-04-25: 1000 mL via INTRAVENOUS

## 2019-04-25 MED ORDER — ASPIRIN 81 MG PO CHEW
81.0000 mg | CHEWABLE_TABLET | Freq: Every day | ORAL | Status: DC
  Administered 2019-04-25 – 2019-04-26 (×2): 81 mg via ORAL
  Filled 2019-04-25 (×2): qty 1

## 2019-04-25 MED ORDER — ALBUTEROL SULFATE (2.5 MG/3ML) 0.083% IN NEBU: 2.5000 mg | INHALATION_SOLUTION | Freq: Two times a day (BID) | RESPIRATORY_TRACT | Status: DC | PRN

## 2019-04-25 MED ORDER — PANTOPRAZOLE SODIUM 40 MG PO TBEC
40.0000 mg | DELAYED_RELEASE_TABLET | Freq: Two times a day (BID) | ORAL | Status: DC
  Administered 2019-04-25 – 2019-04-29 (×7): 40 mg via ORAL
  Filled 2019-04-25: qty 1
  Filled 2019-04-25: qty 2
  Filled 2019-04-25 (×4): qty 1

## 2019-04-25 MED ORDER — ATORVASTATIN CALCIUM 80 MG PO TABS
80.0000 mg | ORAL_TABLET | Freq: Every day | ORAL | Status: DC
  Administered 2019-04-25 – 2019-04-28 (×4): 80 mg via ORAL
  Filled 2019-04-25 (×4): qty 1

## 2019-04-25 MED ORDER — FUROSEMIDE 20 MG PO TABS
20.0000 mg | ORAL_TABLET | Freq: Every day | ORAL | Status: DC
  Administered 2019-04-26 – 2019-04-29 (×3): 20 mg via ORAL
  Filled 2019-04-25 (×4): qty 1

## 2019-04-25 MED ORDER — RIVAROXABAN 15 MG PO TABS
15.0000 mg | ORAL_TABLET | Freq: Two times a day (BID) | ORAL | Status: DC
  Administered 2019-04-25 – 2019-04-26 (×2): 15 mg via ORAL
  Filled 2019-04-25 (×3): qty 1

## 2019-04-25 MED ORDER — VITAMIN D 25 MCG (1000 UNIT) PO TABS
5000.0000 [IU] | ORAL_TABLET | Freq: Every day | ORAL | Status: DC
  Administered 2019-04-25 – 2019-04-29 (×4): 5000 [IU] via ORAL
  Filled 2019-04-25 (×4): qty 5

## 2019-04-25 MED ORDER — CLOPIDOGREL BISULFATE 75 MG PO TABS
75.0000 mg | ORAL_TABLET | Freq: Every day | ORAL | Status: DC
  Administered 2019-04-26: 75 mg via ORAL
  Filled 2019-04-25: qty 1

## 2019-04-25 NOTE — ED Notes (Signed)
Pt placed on purewick 

## 2019-04-25 NOTE — ED Notes (Signed)
Tried to give report, RN not available at this time. 

## 2019-04-25 NOTE — ED Triage Notes (Signed)
Pt here from home for increased weakness x 2 days. Pt sts she feels like someone has zapped all the energy out of her body and feels better laying down. Pt has orthostatic changes. Endorses blood in stool yesterday and blood in panties this morning. Denies pain. Pt A/O x 4. Dx with covid on 12/30, asymptomatic.

## 2019-04-25 NOTE — ED Notes (Signed)
Lunch Tray Ordered @ 1822.  
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
## 2019-04-25 NOTE — H&P (Signed)
History and Physical    Kristina [name] [id number] DOB: [birth year] DOA: 04/25/2019  PCP: Nolene Ebbs, MD   Patient coming from:Home I have personally briefly reviewed patient's old medical records in [place]  Chief Complaint: Dark colored stool  HPI: Kristina [name] is a 74 y.o. female with medical history significant of seizure, CAD s/p stents (Oct 2019), HTN, recent hospitalization 12/31-1/5 for COVID illness and small PE likely related to COVID 19 infection and hypercoagulable state, and was started on Xarelto  presents to ER for evaluation of generalized weakness for two days, and darker colored stool. Today patient had an episode of sudden onset feeling of body weakness and broke out in a hot sweat in forehead. She put her head down and family called 911.  Had been constipated for 2-3 days, called PCP who prescribed her medicine and she took it for 2 days and had a BM yesterday. BM was very dark brown/black with streaks of red blood around it, firm. She also had a brief episode of nose bleed this morning but stopped by itself.  She all her COVID meds last week and no feeling of cough short of breath fever chills.    No h/o GIB. ED Course: Orthostatic vital signs positive  Review of Systems: As per HPI otherwise 10 point review of systems negative.    Past Medical History:  Diagnosis Date  . Arthritis    "right arm" (06/22/2016)  . Carpal tunnel syndrome   . Childhood asthma   . Hypertension   . Plantar fasciitis, bilateral    "had shots in them" (06/22/2016)  . Seizures ([place])    "when I was young" (06/22/2016)  . Wears glasses     Past Surgical History:  Procedure Laterality Date  . CARDIAC CATHETERIZATION    . CARPAL TUNNEL RELEASE Left 03/10/2016   Procedure: CARPAL TUNNEL RELEASE, left;  Surgeon: Daryll Brod, MD;  Location: [facility];  Service: Orthopedics;  Laterality: Left;  . COLONOSCOPY    . CORONARY STENT INTERVENTION N/A 06/23/2016   Procedure: Coronary Stent Intervention;  Surgeon: Sherren Mocha, MD;  Location: [facility] [facility];  Service: Cardiovascular;  Laterality: N/A;  . DILATION AND CURETTAGE OF UTERUS    . FRACTURE SURGERY    . LEFT HEART CATH AND CORONARY ANGIOGRAPHY N/A 06/23/2016   Procedure: Left Heart Cath and Coronary Angiography;  Surgeon: Sherren Mocha, MD;  Location: [place];  Service: Cardiovascular;  Laterality: N/A;  . ORIF WRIST FRACTURE  10/11   left  . TRIGGER FINGER RELEASE Right 02/27/2013   Procedure: RELEASE TRIGGER RIGHT MIDDLE FINGER ;  Surgeon: Wynonia Sours, MD;  Location: [facility];  Service: Orthopedics;  Laterality: Right;  . ULNAR NERVE TRANSPOSITION Left 03/10/2016   Procedure: ULNAR NERVE DECOMPRESSION possible TRANSPOSITION;  Surgeon: Daryll Brod, MD;  Location: [place];  Service: Orthopedics;  Laterality: Left;  [name] VAGINAL HYSTERECTOMY     "took qthing except 1 ovary"     reports that she has never smoked. She has never used smokeless tobacco. She reports current alcohol use of about 6.0 standard drinks of alcohol per week. She reports that she does not use drugs.  No Known Allergies  Family History  Problem Relation Age of Onset  . Stroke Mother 64  . Heart attack Mother 87  . Lung cancer Father   . Lung cancer Brother     Prior to Admission medications   Medication  Sig Start Date End Date Taking? Authorizing Provider  albuterol (VENTOLIN HFA) 108 (90 Base) MCG/ACT inhaler Inhale 2 puffs into the lungs 2 (two) times daily as needed for wheezing or shortness of breath. 04/16/19   Kayleen Memos, DO  ascorbic acid (VITAMIN C) 500 MG tablet Take 1 tablet (500 mg total) by mouth daily. 04/17/19   Kayleen Memos, DO  aspirin 81 MG chewable tablet Chew 1 tablet (81 mg total) by mouth daily. 06/25/16   Dessa Phi, DO  atorvastatin (LIPITOR) 80 MG tablet Take 1 tablet (80 mg total) by mouth daily at 6 PM. 06/24/16   Dessa Phi, DO    clopidogrel (PLAVIX) 75 MG tablet TAKE 1 TABLET BY MOUTH  DAILY Patient taking differently: Take 75 mg by mouth daily.  09/24/18   [name], MD  furosemide (LASIX) 20 MG tablet Take 20 mg by mouth daily.  10/09/14   [provider]  losartan (COZAAR) 50 MG tablet Take 50 mg by mouth daily.    [provider]  nitroGLYCERIN (NITROSTAT) 0.4 MG SL tablet Place 1 tablet (0.4 mg total) under the tongue every 5 (five) minutes as needed for chest pain. 07/12/16 04/10/19  Erlene Quan, PA-C  rivaroxaban (XARELTO) 20 MG TABS tablet Take 1 tablet (20 mg total) by mouth daily with supper. 05/04/19   Kayleen Memos, DO  Rivaroxaban 15 & 20 MG TBPK Follow package directions: Take one 15mg  tablet by mouth twice a day. On day 22, switch to one 20mg  tablet once a day. Take with food. 04/16/19   Kayleen Memos, DO  Vitamin D3 (VITAMIN D) 25 MCG tablet Take 5 tablets (5,000 Units total) by mouth daily. 04/17/19   Kayleen Memos, DO  zinc sulfate 220 (50 Zn) MG capsule Take 1 capsule (220 mg total) by mouth daily. 04/17/19   Kayleen Memos, DO    Physical Exam: Vitals:   04/25/19 1215 04/25/19 1230 04/25/19 1300 04/25/19 1315  BP: 123/71 127/78 133/80 121/73  Pulse:   70 73  Resp: (!) 22 (!) 21 (!) 30 (!) 22  Temp:      TempSrc:      SpO2:   100% 98%    Constitutional: NAD, calm, comfortable Vitals:   04/25/19 1215 04/25/19 1230 04/25/19 1300 04/25/19 1315  BP: 123/71 127/78 133/80 121/73  Pulse:   70 73  Resp: (!) 22 (!) 21 (!) 30 (!) 22  Temp:      TempSrc:      SpO2:   100% 98%   Eyes: PERRL, lids and conjunctivae normal ENMT: Mucous membranes are moist. Posterior pharynx clear of any exudate or lesions.Normal dentition.  Neck: normal, supple, no masses, no thyromegaly Respiratory: clear to auscultation bilaterally, no wheezing, no crackles. Normal respiratory effort. No accessory muscle use.  Cardiovascular: Regular rate and rhythm, no murmurs / rubs / gallops. No extremity  edema. 2+ pedal pulses. No carotid bruits.  Abdomen: no tenderness, no masses palpated. No hepatosplenomegaly. Bowel sounds positive.  Musculoskeletal: no clubbing / cyanosis. No joint deformity upper and lower extremities. Good ROM, no contractures. Normal muscle tone.  Skin: no rashes, lesions, ulcers. No induration Neurologic: CN 2-12 grossly intact. Sensation intact, DTR normal. Strength 5/5 in all 4.  Psychiatric: Normal judgment and insight. Alert and oriented x 3. Normal mood.     Labs on Admission: I have personally reviewed following labs and imaging studies  CBC: Recent Labs  Lab 04/25/19 1100  WBC  7.5  NEUTROABS 5.5  HGB 10.7*  HCT 34.3*  MCV 96.6  PLT [id number]   Basic Metabolic Panel: Recent Labs  Lab 04/25/19 1100  NA 139  K 3.6  CL 102  CO2 26  GLUCOSE 108*  BUN 19  CREATININE 0.95  CALCIUM 8.6*   GFR: Estimated Creatinine Clearance: 62.7 mL/min (by C-G formula based on SCr of 0.95 mg/dL). Liver Function Tests: Recent Labs  Lab 04/25/19 1100  AST 17  ALT 21  ALKPHOS 55  BILITOT 1.0  PROT 5.9*  ALBUMIN 2.6*   No results for input(s): LIPASE, AMYLASE in the last 168 hours. No results for input(s): AMMONIA in the last 168 hours. Coagulation Profile: No results for input(s): INR, PROTIME in the last 168 hours. Cardiac Enzymes: No results for input(s): CKTOTAL, CKMB, CKMBINDEX, TROPONINI in the last 168 hours. BNP (last 3 results) No results for input(s): PROBNP in the last 8760 hours. HbA1C: No results for input(s): HGBA1C in the last 72 hours. CBG: Recent Labs  Lab 04/25/19 1006  GLUCAP 98   Lipid Profile: No results for input(s): CHOL, HDL, LDLCALC, TRIG, CHOLHDL, LDLDIRECT in the last 72 hours. Thyroid Function Tests: No results for input(s): TSH, T4TOTAL, FREET4, T3FREE, THYROIDAB in the last 72 hours. Anemia Panel: No results for input(s): VITAMINB12, FOLATE, FERRITIN, TIBC, IRON, RETICCTPCT in the last 72 hours. Urine analysis:      Component Value Date/Time   COLORURINE YELLOW 04/10/2019 [place] 04/10/2019 1735   LABSPEC 1.014 04/10/2019 1735   PHURINE 6.0 04/10/2019 1735   GLUCOSEU NEGATIVE 04/10/2019 1735   [place] 04/10/2019 1735   BILIRUBINUR NEGATIVE 04/10/2019 1735   [place] 04/10/2019 1735   PROTEINUR NEGATIVE 04/10/2019 1735   UROBILINOGEN 0.2 03/26/2011 0922   NITRITE NEGATIVE 04/10/2019 1735   LEUKOCYTESUR TRACE (A) 04/10/2019 1735    Radiological Exams on Admission: No results found.  EKG: Independently reviewed.   Assessment/Plan Active Problems:   Near syncope  Near syncope, according to ER record patient has orthostatic hypotension, however her blood pressure has been stable throughout ER stay and no IV bolus was given during her ER stay.  She denied any lightheadedness or blurry vision resume her BP meds.  EKG showed no significant PR interval or QTc interval changes.  She had echo done last time showed diastolic dysfunction and her blood pressure has been fairly controlled. Tele for another 12 hours to rule out any significant arrhythmia. Regarding the possible GI bleed, she does noticed some darker colored stool with streaks of blood, happened after her constipation and her hemoglobin level remained same as of last time I will send iron study and discussed with GI PA briefly.  As there is no evidence of significant dropping of her hemoglobin level I will continue her systemic anticoagulation for now, double dose PPI on 2 patient was evaluated by GI service. Check orthostatic vital signs every shift.  HTN, restart her BP meds.  PE, COVID-19 related as above, will continue anticoagulation for total of 6 weeks, still in the loading phase till January 23.  CAD, was on dual antiplatelet, adding Xarelto does increase her GI bleed risk, this was discussed with patient at bedside, and there is no significant drop of her hemoglobin level and anticoagulation is  supposed to be a short course for only 6 weeks, and agreed to continue Xarelto for now with close monitoring.      DVT prophylaxis: On Xarelto Code Status: Full code Family Communication:  None at bedside Disposition Plan: Likely home Consults called: GI PA Admission status: Telemetry observation   Lequita Halt MD [facility] Pager [phone]  If 7PM-7AM, please contact night-coverage [email or web address] Password [email or web address]  04/25/2019, 1:48 PM

## 2019-04-25 NOTE — Consult Note (Signed)
[facility] Consult: 2:19 PM 04/25/2019  LOS: 0 days    Referring Provider: Dr Roosevelt Locks  Primary Care Physician:  Nolene Ebbs, MD Primary Gastroenterologist:  Suszanne Finch.      Reason for Consultation: Anemia.  FOBT positive.   HPI: Kristina [name] is a 74 y.o. female.  PMH seizures.  Hypertension.  CAD, STEMI, DES to LAD 06/2016. Carpal tunnel syndrome.  Hysterectomy and unilateral oophorectomy.  Previous colonoscopies were benign, performed at [facility] but unable to locate any reports.  Admitted 12/30/180-04/16/2019 to [facility] with acute PE in setting of COVID-19 infection.  Treated with 5 days of remdesivir and discharged to complete 10 days of Decadron and started on Xarelto.  Norvasc and metoprolol were held due to bradycardia.  Pt tells me that during the hospital stay her stools were reddish, dark but that these resolved.  Since discharge she has been feeling weak, fatigued but not short of breath.  No cough.  No pain of any sort.  Appetite is good.  Yesterday she saw blood in her underwear towards the front not in the region of the rectum.  Other providers charting mentions patient had firm, dark brown, black stools with streaks of red blood yesterday and blood with wiping but she did not confirm this for me.  Today she saw some pinkish colored urine. This morning she was particularly fatigued, felt diaphoretic on her forehead and dizzy.  She leaned forward and was presyncopal but never fell out.  She returned to the ED.  96/68 with heart rate of 78 at arrival oxygen saturations 99 to 100%. Hb 10.7, was varying between 10.4 and 11.8 in the 5 days prior to discharge of 1/4. Platelets normal.  Do not have an INR. No renal insufficiency and no elevation of BUN. Stool tests FOBT positive    Past Medical History:  Diagnosis Date  . Arthritis    "right arm" (06/22/2016)  . Carpal tunnel syndrome   . Childhood asthma   . Hypertension   . Plantar fasciitis, bilateral    "had shots in them" (06/22/2016)  . Seizures ([place])    "when I was young" (06/22/2016)  . Wears glasses     Past Surgical History:  Procedure Laterality Date  . CARDIAC CATHETERIZATION    . CARPAL TUNNEL RELEASE Left 03/10/2016   Procedure: CARPAL TUNNEL RELEASE, left;  Surgeon: Daryll Brod, MD;  Location: [place];  Service: Orthopedics;  Laterality: Left;  . COLONOSCOPY    . CORONARY STENT INTERVENTION N/A 06/23/2016   Procedure: Coronary Stent Intervention;  Surgeon: Sherren Mocha, MD;  Location: [place] [facility];  Service: Cardiovascular;  Laterality: N/A;  . DILATION AND CURETTAGE OF UTERUS    . FRACTURE SURGERY    . LEFT HEART CATH AND CORONARY ANGIOGRAPHY N/A 06/23/2016   Procedure: Left Heart Cath and Coronary Angiography;  Surgeon: Sherren Mocha, MD;  Location: [place] [facility];  Service: Cardiovascular;  Laterality: N/A;  . ORIF WRIST FRACTURE  10/11   left  . TRIGGER FINGER RELEASE Right  02/27/2013   Procedure: RELEASE TRIGGER RIGHT MIDDLE FINGER ;  Surgeon: Wynonia Sours, MD;  Location: [place];  Service: Orthopedics;  Laterality: Right;  . ULNAR NERVE TRANSPOSITION Left 03/10/2016   Procedure: ULNAR NERVE DECOMPRESSION possible TRANSPOSITION;  Surgeon: Daryll Brod, MD;  Location: [place];  Service: Orthopedics;  Laterality: Left;  [name] VAGINAL HYSTERECTOMY     "took qthing except 1 ovary"    Prior to Admission medications   Medication Sig Start Date End Date Taking? Authorizing Provider  albuterol (VENTOLIN HFA) 108 (90 Base) MCG/ACT inhaler Inhale 2 puffs into the lungs 2 (two) times daily as needed for wheezing or shortness of breath. 04/16/19   Kayleen Memos, DO  ascorbic acid (VITAMIN C) 500 MG tablet Take 1 tablet (500 mg total) by mouth  daily. 04/17/19   Kayleen Memos, DO  aspirin 81 MG chewable tablet Chew 1 tablet (81 mg total) by mouth daily. 06/25/16   Dessa Phi, DO  atorvastatin (LIPITOR) 80 MG tablet Take 1 tablet (80 mg total) by mouth daily at 6 PM. 06/24/16   Dessa Phi, DO  clopidogrel (PLAVIX) 75 MG tablet TAKE 1 TABLET BY MOUTH  DAILY Patient taking differently: Take 75 mg by mouth daily.  09/24/18   [name], MD  furosemide (LASIX) 20 MG tablet Take 20 mg by mouth daily.  10/09/14   [provider]  losartan (COZAAR) 50 MG tablet Take 50 mg by mouth daily.    [provider]  nitroGLYCERIN (NITROSTAT) 0.4 MG SL tablet Place 1 tablet (0.4 mg total) under the tongue every 5 (five) minutes as needed for chest pain. 07/12/16 04/10/19  Erlene Quan, PA-C  rivaroxaban (XARELTO) 20 MG TABS tablet Take 1 tablet (20 mg total) by mouth daily with supper. 05/04/19   Kayleen Memos, DO  Rivaroxaban 15 & 20 MG TBPK Follow package directions: Take one 15mg  tablet by mouth twice a day. On day 22, switch to one 20mg  tablet once a day. Take with food. 04/16/19   Kayleen Memos, DO  Vitamin D3 (VITAMIN D) 25 MCG tablet Take 5 tablets (5,000 Units total) by mouth daily. 04/17/19   Kayleen Memos, DO  zinc sulfate 220 (50 Zn) MG capsule Take 1 capsule (220 mg total) by mouth daily. 04/17/19   Kayleen Memos, DO    Scheduled Meds: . aspirin  81 mg Oral Daily  . atorvastatin  80 mg Oral q1800  . clopidogrel  75 mg Oral Daily  . furosemide  20 mg Oral Daily  . losartan  50 mg Oral Daily  . pantoprazole  40 mg Oral BID  . Rivaroxaban  15 mg Oral BID WC  . Vitamin D3  5,000 Units Oral Daily   Infusions:  PRN Meds: albuterol, nitroGLYCERIN   Allergies as of 04/25/2019  . (No Known Allergies)    Family History  Problem Relation Age of Onset  . Stroke Mother 69  . Heart attack Mother 44  . Lung cancer Father   . Lung cancer Brother     Social History   Socioeconomic History  . Marital status:  Divorced    Spouse name: Not on file  . Number of children: Not on file  . Years of education: Not on file  . Highest education level: Not on file  Occupational History  . Not on file  Tobacco Use  . Smoking status: Never Smoker  . Smokeless tobacco: Never Used  Substance and Sexual Activity  . Alcohol use: Yes    Alcohol/week: 6.0 standard drinks    Types: 6 Glasses of wine per week  . Drug use: No  . Sexual activity: Not Currently  Other Topics Concern  . Not on file  Social History Narrative  . Not on file   Social Determinants of Health   Financial Resource Strain:   . Difficulty of Paying Living Expenses: Not on file  Food Insecurity:   . Worried About Charity fundraiser in the Last Year: Not on file  . Ran Out of Food in the Last Year: Not on file  Transportation Needs:   . Lack of Transportation (Medical): Not on file  . Lack of Transportation (Non-Medical): Not on file  Physical Activity:   . Days of Exercise per Week: Not on file  . Minutes of Exercise per Session: Not on file  Stress:   . Feeling of Stress : Not on file  Social Connections:   . Frequency of Communication with Friends and Family: Not on file  . Frequency of Social Gatherings with Friends and Family: Not on file  . Attends Religious Services: Not on file  . Active Member of Clubs or Organizations: Not on file  . Attends Archivist Meetings: Not on file  . Marital Status: Not on file  Intimate Partner Violence:   . Fear of Current or Ex-Partner: Not on file  . Emotionally Abused: Not on file  . Physically Abused: Not on file  . Sexually Abused: Not on file    REVIEW OF SYSTEMS: Constitutional: Per HPI. ENT:  No nose bleeds Pulm: No cough.  No dyspnea. CV:  No palpitations, no LE edema.  GU:  No hematuria, no frequency GI: See HPI.  Prior to recent events had no reflux symptoms, she is not having any nausea or vomiting. Heme: Other than seeing the blood in her urine, she has  not seen any unusual bleeding or bruising. Transfusions: She recalls no previous blood transfusions. Neuro: Presyncope as per HPI.  No headaches, no peripheral tingling or numbness Derm:  No itching, no rash or sores.  Endocrine: Diaphoretic this morning.  No polyuria or dysuria Immunization: Not queried. Travel:  None beyond local counties in last few months.    PHYSICAL EXAM: Vital signs in last 24 hours: Vitals:   04/25/19 1300 04/25/19 1315  BP: 133/80 121/73  Pulse: 70 73  Resp: (!) 30 (!) 22  Temp:    SpO2: 100% 98%   Wt Readings from Last 3 Encounters:  04/14/19 93.7 kg  12/19/18 92.8 kg  01/06/18 97.4 kg    General: Pleasant, calm, nonill appearing, lying in bed, comfortable. Head: No facial asymmetry or swelling.  No signs of head trauma. Eyes: No conjunctival pallor.  No scleral icterus.  EOMI. Ears: Not hard of hearing Nose: No congestion, no discharge. Mouth: Oropharynx moist, pink, clear.  Tongue midline. Neck: No JVD, no masses, no thyromegaly. Lungs: Clear bilaterally.  No labored breathing, no cough, no adventitious sounds. Heart: RRR.  No MRG.  S1, S2 present. Abdomen: Soft.  Obese.  Not tender or distended.  No HSM, masses, bruits, hernias.  Active bowel sounds..   Rectal: Did not repeat DRE.  Stool FOBT positive and very dark brown/black but no visible blood, no masses, no hemorrhoids. Musc/Skeltl: No joint redness, swelling or gross deformities. Extremities: No CCE. Neurologic: Alert and oriented x3. Skin: No rash, no sores, no suspicious lesions. Tattoos: None  observed. Nodes: No cervical adenopathy. Psych: Pleasant, calm, cooperative.  Intake/Output from previous day: No intake/output data recorded. Intake/Output this shift: Total I/O In: 1000 [IV Piggyback:1000] Out: -   LAB RESULTS: Recent Labs    04/25/19 1100  WBC 7.5  HGB 10.7*  HCT 34.3*  PLT 248   BMET Lab Results  Component Value Date   NA 139 04/25/2019   NA 141 04/15/2019    NA 142 04/14/2019   K 3.6 04/25/2019   K 3.8 04/15/2019   K 3.3 (L) 04/14/2019   CL 102 04/25/2019   CL 106 04/15/2019   CL 106 04/14/2019   CO2 26 04/25/2019   CO2 27 04/15/2019   CO2 27 04/14/2019   GLUCOSE 108 (H) 04/25/2019   GLUCOSE 112 (H) 04/15/2019   GLUCOSE 108 (H) 04/14/2019   BUN 19 04/25/2019   BUN 13 04/15/2019   BUN 14 04/14/2019   CREATININE 0.95 04/25/2019   CREATININE 0.81 04/15/2019   CREATININE 0.72 04/14/2019   CALCIUM 8.6 (L) 04/25/2019   CALCIUM 9.0 04/15/2019   CALCIUM 8.7 (L) 04/14/2019   LFT Recent Labs    04/25/19 1100  PROT 5.9*  ALBUMIN 2.6*  AST 17  ALT 21  ALKPHOS 55  BILITOT 1.0   PT/INR Lab Results  Component Value Date   INR 1.05 01/04/2018   INR 1.12 06/23/2016   INR 1.02 06/22/2016   Hepatitis Panel No results for input(s): HEPBSAG, HCVAB, HEPAIGM, HEPBIGM in the last 72 hours. C-Diff No components found for: CDIFF Lipase     Component Value Date/Time   LIPASE 29 03/26/2011 0833    Drugs of Abuse  No results found for: LABOPIA, COCAINSCRNUR, LABBENZ, AMPHETMU, THCU, LABBARB   RADIOLOGY STUDIES: No results found.   IMPRESSION:   *     Presyncope.  *      Recent 6-day hospitalization for COVID-19 infection treated with remdesivir and Decadron.  Acute PE treated with Xarelto at discharge.  *     Blood per rectum as well as hematuria. Unable to locate previous colonoscopy reports which patient describes as benign.  *      Anemia with stable Hb over the last 10 days.  Total Hb drop of 12.6 on 12/30 >> 10.7 today.   PLAN:     *     Per Dr [name].  ? Colonoscopy =/- EGD?    Azucena Freed  04/25/2019, 2:19 PM Phone [phone]

## 2019-04-25 NOTE — ED Notes (Signed)
Declined to have anyone contacted at this time 

## 2019-04-25 NOTE — ED Notes (Signed)
Pt aware that we need urine specimen. 

## 2019-04-26 ENCOUNTER — Telehealth: Payer: Self-pay

## 2019-04-26 ENCOUNTER — Encounter (HOSPITAL_COMMUNITY): Payer: Self-pay | Admitting: Internal Medicine

## 2019-04-26 ENCOUNTER — Other Ambulatory Visit: Payer: Self-pay

## 2019-04-26 DIAGNOSIS — Z90721 Acquired absence of ovaries, unilateral: Secondary | ICD-10-CM | POA: Diagnosis not present

## 2019-04-26 DIAGNOSIS — Z86711 Personal history of pulmonary embolism: Secondary | ICD-10-CM | POA: Diagnosis not present

## 2019-04-26 DIAGNOSIS — Z823 Family history of stroke: Secondary | ICD-10-CM | POA: Diagnosis not present

## 2019-04-26 DIAGNOSIS — I951 Orthostatic hypotension: Secondary | ICD-10-CM | POA: Diagnosis present

## 2019-04-26 DIAGNOSIS — I252 Old myocardial infarction: Secondary | ICD-10-CM | POA: Diagnosis not present

## 2019-04-26 DIAGNOSIS — K625 Hemorrhage of anus and rectum: Secondary | ICD-10-CM | POA: Diagnosis not present

## 2019-04-26 DIAGNOSIS — Z7901 Long term (current) use of anticoagulants: Secondary | ICD-10-CM

## 2019-04-26 DIAGNOSIS — I5032 Chronic diastolic (congestive) heart failure: Secondary | ICD-10-CM | POA: Diagnosis present

## 2019-04-26 DIAGNOSIS — K922 Gastrointestinal hemorrhage, unspecified: Secondary | ICD-10-CM | POA: Diagnosis present

## 2019-04-26 DIAGNOSIS — Z01810 Encounter for preprocedural cardiovascular examination: Secondary | ICD-10-CM | POA: Diagnosis not present

## 2019-04-26 DIAGNOSIS — I2510 Atherosclerotic heart disease of native coronary artery without angina pectoris: Secondary | ICD-10-CM | POA: Diagnosis present

## 2019-04-26 DIAGNOSIS — E669 Obesity, unspecified: Secondary | ICD-10-CM | POA: Diagnosis present

## 2019-04-26 DIAGNOSIS — Z90710 Acquired absence of both cervix and uterus: Secondary | ICD-10-CM | POA: Diagnosis not present

## 2019-04-26 DIAGNOSIS — D62 Acute posthemorrhagic anemia: Secondary | ICD-10-CM | POA: Diagnosis present

## 2019-04-26 DIAGNOSIS — E785 Hyperlipidemia, unspecified: Secondary | ICD-10-CM | POA: Diagnosis present

## 2019-04-26 DIAGNOSIS — U071 COVID-19: Secondary | ICD-10-CM

## 2019-04-26 DIAGNOSIS — K921 Melena: Secondary | ICD-10-CM | POA: Diagnosis present

## 2019-04-26 DIAGNOSIS — Z955 Presence of coronary angioplasty implant and graft: Secondary | ICD-10-CM | POA: Diagnosis not present

## 2019-04-26 DIAGNOSIS — D509 Iron deficiency anemia, unspecified: Secondary | ICD-10-CM | POA: Diagnosis present

## 2019-04-26 DIAGNOSIS — K2901 Acute gastritis with bleeding: Secondary | ICD-10-CM | POA: Diagnosis present

## 2019-04-26 DIAGNOSIS — I2699 Other pulmonary embolism without acute cor pulmonale: Secondary | ICD-10-CM | POA: Diagnosis not present

## 2019-04-26 DIAGNOSIS — Z801 Family history of malignant neoplasm of trachea, bronchus and lung: Secondary | ICD-10-CM | POA: Diagnosis not present

## 2019-04-26 DIAGNOSIS — Z6831 Body mass index (BMI) 31.0-31.9, adult: Secondary | ICD-10-CM | POA: Diagnosis not present

## 2019-04-26 DIAGNOSIS — Z8249 Family history of ischemic heart disease and other diseases of the circulatory system: Secondary | ICD-10-CM | POA: Diagnosis not present

## 2019-04-26 DIAGNOSIS — Z7902 Long term (current) use of antithrombotics/antiplatelets: Secondary | ICD-10-CM

## 2019-04-26 DIAGNOSIS — R590 Localized enlarged lymph nodes: Secondary | ICD-10-CM | POA: Diagnosis present

## 2019-04-26 DIAGNOSIS — Z8616 Personal history of COVID-19: Secondary | ICD-10-CM | POA: Diagnosis not present

## 2019-04-26 DIAGNOSIS — K648 Other hemorrhoids: Secondary | ICD-10-CM | POA: Diagnosis present

## 2019-04-26 DIAGNOSIS — I110 Hypertensive heart disease with heart failure: Secondary | ICD-10-CM | POA: Diagnosis present

## 2019-04-26 DIAGNOSIS — C187 Malignant neoplasm of sigmoid colon: Secondary | ICD-10-CM | POA: Diagnosis present

## 2019-04-26 DIAGNOSIS — Z01818 Encounter for other preprocedural examination: Secondary | ICD-10-CM | POA: Diagnosis not present

## 2019-04-26 LAB — CBC
HCT: 32.9 % — ABNORMAL LOW (ref 36.0–46.0)
Hemoglobin: 10.7 g/dL — ABNORMAL LOW (ref 12.0–15.0)
MCH: 30.3 pg (ref 26.0–34.0)
MCHC: 32.5 g/dL (ref 30.0–36.0)
MCV: 93.2 fL (ref 80.0–100.0)
Platelets: 238 10*3/uL (ref 150–400)
RBC: 3.53 MIL/uL — ABNORMAL LOW (ref 3.87–5.11)
RDW: 14.6 % (ref 11.5–15.5)
WBC: 6.1 10*3/uL (ref 4.0–10.5)
nRBC: 0 % (ref 0.0–0.2)

## 2019-04-26 LAB — HEMOGLOBIN AND HEMATOCRIT, BLOOD
HCT: 32.3 % — ABNORMAL LOW (ref 36.0–46.0)
Hemoglobin: 10.4 g/dL — ABNORMAL LOW (ref 12.0–15.0)

## 2019-04-26 MED ORDER — PEG-KCL-NACL-NASULF-NA ASC-C 100 G PO SOLR
0.5000 | Freq: Once | ORAL | Status: AC
  Administered 2019-04-26: 100 g via ORAL
  Filled 2019-04-26: qty 1

## 2019-04-26 MED ORDER — PEG-KCL-NACL-NASULF-NA ASC-C 100 G PO SOLR
0.5000 | Freq: Once | ORAL | Status: AC
  Administered 2019-04-27: 100 g via ORAL

## 2019-04-26 MED ORDER — ENSURE ENLIVE PO LIQD: 237.0000 mL | Freq: Two times a day (BID) | ORAL | Status: DC

## 2019-04-26 NOTE — Progress Notes (Signed)
Patient arrived to 5W from [facility] ED. Alert and oriented x4. O2 95%+ on room air. PIV left hand, flushed and saline locked. Patient washed and verified on telemetry monitor. Skin intact. Educated on routine and how to use the call bell. Pt was told to call for assistance if they need to use the bathroom.

## 2019-04-26 NOTE — Progress Notes (Signed)
Initial Nutrition Assessment  RD working remotely.  DOCUMENTATION CODES:   Obesity unspecified  INTERVENTION:   - Ensure Enlive po BID, each supplement provides 350 kcal and 20 grams of protein  - Encourage adequate PO intake  NUTRITION DIAGNOSIS:   Increased nutrient needs related to acute illness, catabolic illness (recent XX123456) as evidenced by estimated needs.  GOAL:   Patient will meet greater than or equal to 90% of their needs  MONITOR:   PO intake, Supplement acceptance, Labs, Weight trends  REASON FOR ASSESSMENT:   Malnutrition Screening Tool    ASSESSMENT:   74 year old female who presented to the ED on 1/14 with increased weakness and rectal bleeding. Pt diagnosed with COVID-19 on 04/10/19. PMH of seizure, CAD s/p stents, HTN, recent hospitalization for COVID-19.   Pt is on a Heart Healthy diet. No meal completions recorded at this time.  Spoke with pt via phone call to room. Pt reports having a good appetite and states she is waiting on her breakfast as it has not yet arrived. Pt reports that even during her hospitalization with COVID-19, she did not have a change in appetite. She reports that her appetite has been normal over the past several months. Pt states that she typically eats 3 meals daily.  Pt does mention that for about 3 days prior to this admission, she "wasn't eating like I should" and was "picking" at food because she didn't feel good. Pt states that she is "making up for it now." Pt reports she ate most of dinner yesterday.  Pt reports a UBW of 200 lbs and believes she may have lost some weight but is not sure. Per weight history in chart, pt with a 4.6 kg weight loss since 04/14/19. This is a 4.9% weight loss in 2 weeks which is significant for timeframe.  Pt willing to consume an oral nutrition supplement to aid in meeting kcal and protein needs. RD to order.  Medications reviewed and include: cholecalciferol, Lasix, protonix  Labs  reviewed. CBG: 98  NUTRITION - FOCUSED PHYSICAL EXAM:  Unable to complete at this time. RD working remotely.  Diet Order:   Diet Order            Diet Heart Room service appropriate? Yes; Fluid consistency: Thin  Diet effective now              EDUCATION NEEDS:   Education needs have been addressed  Skin:  Skin Assessment: Reviewed RN Assessment  Last BM:  no documented BM  Height:   Ht Readings from Last 1 Encounters:  04/10/19 5\' 6"  (1.676 m)    Weight:   Wt Readings from Last 1 Encounters:  04/25/19 89.1 kg    Ideal Body Weight:  59.1 kg  BMI:  Body mass index is 31.7 kg/m.  Estimated Nutritional Needs:   Kcal:  [id number]  Protein:  90-110 grams  Fluid:  1.8-2.0 L    Gaynell Face, MS, RD, LDN Inpatient Clinical Dietitian Pager: [phone] Weekend/After Hours: [phone]

## 2019-04-26 NOTE — Progress Notes (Signed)
Patient ID: Kristina [name], female   DOB: 1945/09/27, 74 y.o.   MRN: [id number]    Progress Note   Subjective  Day # 2  CC; Presyncope, single episode of hematochezia Covid positive/new PE  Hemoglobin 10.3 stable  On Xarelto, Plavix and aspirin  Patient says she feels pretty good, no specific complaints, has not been up ambulating. No complaint of abdominal discomfort or nausea, no bowel movement since admit   Objective   Vital signs in last 24 hours: Temp:  [97.9 F (36.6 C)-98.5 F (36.9 C)] 98.5 F (36.9 C) (01/15 0549) Pulse Rate:  [63-96] 73 (01/15 0549) Resp:  [17-30] 18 (01/15 0549) BP: (106-141)/(68-96) 110/70 (01/15 0549) SpO2:  [93 %-100 %] 97 % (01/15 0549) Weight:  [89.1 kg] 89.1 kg (01/14 2220) Last BM Date: 04/24/19 General:    Older AA female in NAD Heart:  Regular rate and rhythm; no murmurs Lungs: Respirations even and unlabored, lungs CTA bilaterally Abdomen:  Soft, nontender and nondistended. Normal bowel sounds. Extremities:  Without edema. Neurologic:  Alert and oriented,  grossly normal neurologically. Psych:  Cooperative. Normal mood and affect.  Intake/Output from previous day: 01/14 0701 - 01/15 0700 In: 1200 [P.O.:200; IV Piggyback:1000] Out: -  Intake/Output this shift: No intake/output data recorded.  Lab Results: Recent Labs    04/25/19 1100 04/25/19 1100 04/25/19 1446 04/25/19 2251 04/26/19 0718  WBC 7.5  --   --   --   --   HGB 10.7*   < > 10.8* 10.3* 10.4*  HCT 34.3*   < > 34.6* 31.5* 32.3*  PLT 248  --   --   --   --    < > = values in this interval not displayed.   BMET Recent Labs    04/25/19 1100  NA 139  K 3.6  CL 102  CO2 26  GLUCOSE 108*  BUN 19  CREATININE 0.95  CALCIUM 8.6*   LFT Recent Labs    04/25/19 1100  PROT 5.9*  ALBUMIN 2.6*  AST 17  ALT 21  ALKPHOS 55  BILITOT 1.0   PT/INR No results for input(s): LABPROT, INR in the last 72 hours.      Assessment / Plan:    #[age] year old  African-American female with coronary artery disease status post PCI, hypertension, and recent XX123456 infection complicated by acute PE Discharge from hospital 04/16/2019 on Xarelto Readmitted yesterday after presyncopal episode at home.  She had one episode of hematochezia with a bowel movement the day before.  This was the first bowel movement in several days. No further evidence of bleeding since, no further bowel movements and overall hemoglobin stable.  As hemoglobin has remained stable, GI plan is to continue observation, and defer endoscopy and colonoscopy to outpatient once she has completely recovered from COVID-19 infection, cleared respiratory precautions etc.  Timing of endoscopic evaluation will need to be determined.  Generally avoid interrupting anticoagulation for 3 to 6 months post PE, could do Lovenox bridge  Our office will contact patient with follow-up office visit with Dr. [name].  GI will sign off, available for problems, thank you   Active Problems:   Near syncope     LOS: 0 days   Noboru Bidinger EsterwoodPA-C  04/26/2019, 11:09 AM

## 2019-04-26 NOTE — Progress Notes (Addendum)
Progress Note  I had discharged the patient.  However just prior to being discharged by RN, patient reported dark BM with blood.  RN witnessed and reports melenotic stool with bright red blood in toilet bowl water.  I discussed with Dr. [name], GI and plan as follows  Ongoing concerns for GI bleed, could be upper, lower or both.  Discontinued aspirin, Plavix and Xarelto.  Changing to clear liquid diet and for possible EGD +/- colonoscopy on 1/16.  Started oral Protonix and bowel prep.  The PE noted on recent CT was small and also some possibility that it could have been an artifact.  However in the context of GI bleed, increased risk of worsening GI bleed with life-threatening complications if anticoagulation continued.  Hence holding anticoagulation for now.  Repeat CBC later tonight to ensure stability and in a.m.  Discharge obviously canceled.  Since patient has clinical ongoing GI bleed, warrants close monitoring including follow-up of repeat CBCs and procedures as noted above.  Hence changed from observation to inpatient status.  Vernell Leep, MD, [place], [facility]. [facility]  To contact the attending provider between 7A-7P or the covering provider during after hours 7P-7A, please log into the web site [email or web address] and access using universal [place] password for that web site. If you do not have the password, please call the hospital operator.

## 2019-04-26 NOTE — Telephone Encounter (Signed)
Pyrtle, Lajuan Lines, MD  Algernon Huxley, RN; Lang Snow, please arrange office followup for this patient in 2-3 weeks with APP.  Will need to discuss outpatient EGD/colon.  Thanks   Carlis Stable  JMP    Pt scheduled to see Dr. [name] 05/13/19@3 :40pm, pt can have CBC drawn that day.

## 2019-04-26 NOTE — Discharge Summary (Signed)
Physician Discharge Summary  Kristina [name] [id number] DOB: 1946/02/26  PCP: Kristina Ebbs, MD  Admitted from: Home Discharged to: Home  Admit date: 04/25/2019 Discharge date: 04/26/2019  Recommendations for Outpatient Follow-up:   Follow-up Information    Kristina Ebbs, MD. Schedule an appointment as soon as possible for a visit in 1 week(s).   Specialty: Internal Medicine Why: To be seen with repeat labs (CBC & CMP). Contact information: [name] [address] [id number]        [name], MD .   Specialties: Cardiology, Radiology Contact information: [address] [place] [address] [phone]        [name], MD Follow up.   Specialty: Gastroenterology Why: MDs office will arrange office visit. Contact information: [address] [place] [address] [phone]            Home Health: None Equipment/Devices: None  Discharge Condition: Improved and stable CODE STATUS: Full Diet recommendation: Heart healthy diet.  Discharge Diagnoses:  Active Problems:   Near syncope   Rectal bleeding   Platelet inhibition due to Plavix   COVID-19 virus infection   Brief Summary: 74 year old female, lives with her family, independent, PMH of CAD s/p stent (2018), HTN, hyperlipidemia, chronic diastolic CHF, obesity, remote history of seizures, recent hospitalization 04/10/2019-04/15/2018 for COVID-19 viral pneumonia complicated by acute segmental pulmonary embolism at which time she completed 5 days of remdesivir, has completed Decadron, was discharged on Xarelto, had been constipated for 2 to 3 days for which she called her PCP and took some medications following which she had a BM on 1/13 which she reports as dark brown color with a very small amount of blood.  I verified with her today and she clearly denies melanotic stools which were noted in prior notes.  She also noted her underwear stained with blood  and was concerned.  She denied abdominal pain, nausea or vomiting.  She has been weak for a couple days which seemed to get worse, had some diaphoresis but did not pass out.  She was admitted for further evaluation and management.  Assessment and plan:  1. Rectal bleeding: It appears that she had very small volume bright red bleeding per rectum.  History not suggestive of upper GI etiology.  [facility] was consulted.  She was observed overnight, she has not had a BM in 48 hours.  Her hemoglobin has been stable in the 10 g range and doing against a large bleed.  I discussed with Dr. [name], GI who suspects that she had an outlet bleeding, no plans for any endoscopy procedures, clear for discharge home and his office will arrange outpatient follow-up for further evaluation and treatment.  I discussed with patient's primary cardiologist who advised that her PCI procedure was a long time ago and hence it was okay to stop Plavix which was thereby discontinued.  As per GI, okay to continue aspirin and recently started Xarelto.  No indication for PPI.  Patient advised to seek immediate medical attention if she has recurrence of rectal bleed and she verbalizes understanding. 2. Near syncope: Could be related to orthostatic hypotension.  No further symptoms pertaining to this.  Telemetry shows sinus rhythm without abnormalities.  However if she continues to have persistent symptoms despite adjusting medications, recommend close outpatient follow-up with PCP/cardiology for further evaluation 3. Orthostatic hypotension: Unclear etiology.  Clinically appears euvolemic.  Vitals on admission, supine (94/58, 68/min), sitting (83/56, 76/min), standing (72/47, 80/min).  On recent admission, amlodipine and metoprolol were advised discontinued due to sinus bradycardia in the high 40s.  However it appears that patient continued to take these medications.  Currently blood pressures controlled on Lasix and losartan alone, continue.   Continue to hold metoprolol and amlodipine until close outpatient follow-up with PCP.  Repeated vitals at this time show negative orthostatic changes. 4. Recent COVID-19 pneumonia: Seems to have recovered fully from this.  No respiratory symptoms or hypoxia.  Has been quarantining at home which she states is supposed to end on 1/20.  Asymptomatic. 5. Suspected right lower lobe segmental PE by CTA chest 04/10/2019: This could be due to COVID-19 related hypercoagulable state.  However radiologist also opines that this could be some beam hardening artifact.  Patient had been discharged on Xarelto, continue and probably needs more than 6 weeks anticoagulation, i.e. at least 3 months if not longer.  Defer to PCP regarding duration of anticoagulation and further management. 6. Hilar lymphadenopathy: Noted on same CTA chest 04/10/2019.  Follow radiologist recommendations i.e. recommend repeating chest CT in 3 months to ensure stability or resolution of these findings.  7. CAD status post PCI: No anginal symptoms.  I communicated with the patient's primary cardiologist who advised that since patient's PCI was a long time ago, okay to discontinue Plavix and continue aspirin alone while on Xarelto.  As stated above, beta-blockers held during recent admission for bradycardia and now held due to concern that her presyncope/orthostatic hypotension may be worsened by her ongoing use of polypharmacy antihypertensives. 8. Chronic diastolic CHF: Compensated.  Continue Lasix and losartan. 9. Essential hypertension: Controlled as noted below on Lasix and losartan alone. 10. Chronic normocytic anemia: Suspect that her baseline hemoglobin lately is in the mid 10 g range.  Despite reported GI bleed, hemoglobin remained stable.  Suspect that her anemia is multifactorial related to iron deficiency (anemia panel: Iron 52, TIBC 295, saturation ratio 18, ferritin 40,), possible chronic disease and recent acute illness.  Follow CBC as  outpatient and consider evaluation of iron deficiency anemia as deemed necessary including GI evaluation.  She will have GI follow-up.   Consultations:  [facility]  Procedures:  None   Discharge Instructions  Discharge Instructions    (HEART FAILURE PATIENTS) Call MD:  Anytime you have any of the following symptoms: 1) 3 pound weight gain in 24 hours or 5 pounds in 1 week 2) shortness of breath, with or without a dry hacking cough 3) swelling in the hands, feet or stomach 4) if you have to sleep on extra pillows at night in order to breathe.   Complete by: As directed    Call MD for:   Complete by: As directed    Recurrent rectal bleeding or passing black tarry stools.   Call MD for:  difficulty breathing, headache or visual disturbances   Complete by: As directed    Call MD for:  extreme fatigue   Complete by: As directed    Call MD for:  persistant dizziness or light-headedness   Complete by: As directed    Call MD for:  persistant nausea and vomiting   Complete by: As directed    Call MD for:  severe uncontrolled pain   Complete by: As directed    Call MD for:  temperature >100.4   Complete by: As directed    Diet - low sodium heart healthy   Complete by: As directed    Increase activity slowly   Complete by: As  directed        Medication List    STOP taking these medications   amLODipine 5 MG tablet Commonly known as: NORVASC   clopidogrel 75 MG tablet Commonly known as: PLAVIX   metoprolol tartrate 25 MG tablet Commonly known as: LOPRESSOR     TAKE these medications   albuterol 108 (90 Base) MCG/ACT inhaler Commonly known as: VENTOLIN HFA Inhale 2 puffs into the lungs 2 (two) times daily as needed for wheezing or shortness of breath.   ascorbic acid 500 MG tablet Commonly known as: VITAMIN C Take 1 tablet (500 mg total) by mouth daily.   aspirin 81 MG chewable tablet Chew 1 tablet (81 mg total) by mouth daily.   atorvastatin 80 MG tablet Commonly  known as: LIPITOR Take 1 tablet (80 mg total) by mouth daily at 6 PM.   furosemide 20 MG tablet Commonly known as: LASIX Take 20 mg by mouth daily.   losartan 50 MG tablet Commonly known as: COZAAR Take 50 mg by mouth daily.   nitroGLYCERIN 0.4 MG SL tablet Commonly known as: NITROSTAT Place 1 tablet (0.4 mg total) under the tongue every 5 (five) minutes as needed for chest pain.   Rivaroxaban 15 & 20 MG Tbpk Follow package directions: Take one 15mg  tablet by mouth twice a day. On day 22, switch to one 20mg  tablet once a day. Take with food.   rivaroxaban 20 MG Tabs tablet Commonly known as: XARELTO Take 1 tablet (20 mg total) by mouth daily with supper. Start taking on: May 04, 2019   Vitamin D3 25 MCG tablet Commonly known as: Vitamin D Take 5 tablets (5,000 Units total) by mouth daily.   zinc sulfate 220 (50 Zn) MG capsule Take 1 capsule (220 mg total) by mouth daily.      Allergies  Allergen Reactions  . Tramadol Itching      Procedures/Studies:   Subjective: I interviewed patient along with her female RN in room.  Patient has had no BM since 1/13. Has been ambulating comfortably in the room without dizziness, lightheadedness, chest pain, dyspnea, palpitations or feeling like passing out.  No nausea, vomiting or abdominal pain.  Anxious to go home.  Discharge Exam:  Vitals:   04/26/19 0000 04/26/19 0549 04/26/19 1226 04/26/19 1431  BP:  110/70 111/64 105/71  Pulse: 63 73 88 87  Resp: 17 18 18 20   Temp:  98.5 F (36.9 C) 97.9 F (36.6 C) 98.4 F (36.9 C)  TempSrc:  Oral Oral Oral  SpO2: 93% 97% 95%   Weight:        General: Pleasant elderly female, moderately built and obese lying comfortably propped up in bed without distress. Cardiovascular: S1 & S2 heard, RRR, S1/S2 +. No murmurs, rubs, gallops or clicks. No JVD or pedal edema.  Telemetry personally reviewed: Sinus rhythm. Respiratory: Clear to auscultation without wheezing, rhonchi or  crackles. No increased work of breathing. Abdominal:  Non distended, non tender & soft. No organomegaly or masses appreciated. Normal bowel sounds heard. CNS: Alert and oriented. No focal deficits. Extremities: no edema, no cyanosis    The results of significant diagnostics from this hospitalization (including imaging, microbiology, ancillary and laboratory) are listed below for reference.     Microbiology: No results found for this or any previous visit (from the past 240 hour(s)).   Labs: CBC: Recent Labs  Lab 04/25/19 1100 04/25/19 1446 04/25/19 2251 04/26/19 0718  WBC 7.5  --   --   --  NEUTROABS 5.5  --   --   --   HGB 10.7* 10.8* 10.3* 10.4*  HCT 34.3* 34.6* 31.5* 32.3*  MCV 96.6  --   --   --   PLT 248  --   --   --     Basic Metabolic Panel: Recent Labs  Lab 04/25/19 1100  NA 139  K 3.6  CL 102  CO2 26  GLUCOSE 108*  BUN 19  CREATININE 0.95  CALCIUM 8.6*    Liver Function Tests: Recent Labs  Lab 04/25/19 1100  AST 17  ALT 21  ALKPHOS 55  BILITOT 1.0  PROT 5.9*  ALBUMIN 2.6*    CBG: Recent Labs  Lab 04/25/19 1006  GLUCAP 98    Anemia work up Recent Labs    04/25/19 1446  TIBC 295  IRON 52  RETICCTPCT 1.3    Time coordinating discharge: 25 minutes  SIGNED:  Vernell Leep, MD, [place], [facility]. [facility]  To contact the attending provider between 7A-7P or the covering provider during after hours 7P-7A, please log into the web site [email or web address] and access using universal [place] password for that web site. If you do not have the password, please call the hospital operator.

## 2019-04-26 NOTE — Progress Notes (Signed)
Assisted patient to the bathroom. Steady on her feet, stand-by assist. Pt denied feeling any weaker, but commented that she "feels stronger." Pt also denies any dizziness, SOB, or pain.

## 2019-04-26 NOTE — H&P (View-Only) (Signed)
Patient ID: Kristina [name], female   DOB: 01/09/1946, 74 y.o.   MRN: [id number]    Progress Note   Subjective  Day # 2  CC; Presyncope, single episode of hematochezia Covid positive/new PE  Hemoglobin 10.3 stable  On Xarelto, Plavix and aspirin  Patient says she feels pretty good, no specific complaints, has not been up ambulating. No complaint of abdominal discomfort or nausea, no bowel movement since admit   Objective   Vital signs in last 24 hours: Temp:  [97.9 F (36.6 C)-98.5 F (36.9 C)] 98.5 F (36.9 C) (01/15 0549) Pulse Rate:  [63-96] 73 (01/15 0549) Resp:  [17-30] 18 (01/15 0549) BP: (106-141)/(68-96) 110/70 (01/15 0549) SpO2:  [93 %-100 %] 97 % (01/15 0549) Weight:  [89.1 kg] 89.1 kg (01/14 2220) Last BM Date: 04/24/19 General:    Older AA female in NAD Heart:  Regular rate and rhythm; no murmurs Lungs: Respirations even and unlabored, lungs CTA bilaterally Abdomen:  Soft, nontender and nondistended. Normal bowel sounds. Extremities:  Without edema. Neurologic:  Alert and oriented,  grossly normal neurologically. Psych:  Cooperative. Normal mood and affect.  Intake/Output from previous day: 01/14 0701 - 01/15 0700 In: 1200 [P.O.:200; IV Piggyback:1000] Out: -  Intake/Output this shift: No intake/output data recorded.  Lab Results: Recent Labs    04/25/19 1100 04/25/19 1100 04/25/19 1446 04/25/19 2251 04/26/19 0718  WBC 7.5  --   --   --   --   HGB 10.7*   < > 10.8* 10.3* 10.4*  HCT 34.3*   < > 34.6* 31.5* 32.3*  PLT 248  --   --   --   --    < > = values in this interval not displayed.   BMET Recent Labs    04/25/19 1100  NA 139  K 3.6  CL 102  CO2 26  GLUCOSE 108*  BUN 19  CREATININE 0.95  CALCIUM 8.6*   LFT Recent Labs    04/25/19 1100  PROT 5.9*  ALBUMIN 2.6*  AST 17  ALT 21  ALKPHOS 55  BILITOT 1.0   PT/INR No results for input(s): LABPROT, INR in the last 72 hours.      Assessment / Plan:    #29 74 year old  African-American female with coronary artery disease status post PCI, hypertension, and recent [id number] infection complicated by acute PE Discharge from hospital 04/16/2019 on Xarelto Readmitted yesterday after presyncopal episode at home.  She had one episode of hematochezia with a bowel movement the day before.  This was the first bowel movement in several days. No further evidence of bleeding since, no further bowel movements and overall hemoglobin stable.  As hemoglobin has remained stable, GI plan is to continue observation, and defer endoscopy and colonoscopy to outpatient once she has completely recovered from COVID-19 infection, cleared respiratory precautions etc.  Timing of endoscopic evaluation will need to be determined.  Generally avoid interrupting anticoagulation for 3 to 6 months post PE, could do Lovenox bridge  Our office will contact patient with follow-up office visit with Dr. [name].  GI will sign off, available for problems, thank you   Active Problems:   Near syncope     LOS: 0 days   Tacara Hadlock EsterwoodPA-C  04/26/2019, 11:09 AM

## 2019-04-27 ENCOUNTER — Inpatient Hospital Stay (HOSPITAL_COMMUNITY): Payer: 59

## 2019-04-27 ENCOUNTER — Inpatient Hospital Stay (HOSPITAL_COMMUNITY): Payer: 59 | Admitting: Anesthesiology

## 2019-04-27 ENCOUNTER — Encounter (HOSPITAL_COMMUNITY): Payer: Self-pay | Admitting: Internal Medicine

## 2019-04-27 ENCOUNTER — Other Ambulatory Visit: Payer: Self-pay | Admitting: Internal Medicine

## 2019-04-27 ENCOUNTER — Encounter (HOSPITAL_COMMUNITY)
Admission: EM | Disposition: A | Discharge status: Home / Self Care | Payer: Self-pay | Source: Home / Self Care | Attending: Internal Medicine

## 2019-04-27 DIAGNOSIS — K921 Melena: Secondary | ICD-10-CM

## 2019-04-27 DIAGNOSIS — K2901 Acute gastritis with bleeding: Secondary | ICD-10-CM

## 2019-04-27 DIAGNOSIS — C187 Malignant neoplasm of sigmoid colon: Secondary | ICD-10-CM

## 2019-04-27 DIAGNOSIS — U071 COVID-19: Secondary | ICD-10-CM

## 2019-04-27 HISTORY — PX: ESOPHAGOGASTRODUODENOSCOPY (EGD) WITH PROPOFOL: SHX5813

## 2019-04-27 HISTORY — PX: BIOPSY: SHX5522

## 2019-04-27 HISTORY — PX: COLONOSCOPY WITH PROPOFOL: SHX5780

## 2019-04-27 HISTORY — PX: SUBMUCOSAL TATTOO INJECTION: SHX6856

## 2019-04-27 LAB — CBC
HCT: 30.1 % — ABNORMAL LOW (ref 36.0–46.0)
Hemoglobin: 9.8 g/dL — ABNORMAL LOW (ref 12.0–15.0)
MCH: 30.2 pg (ref 26.0–34.0)
MCHC: 32.6 g/dL (ref 30.0–36.0)
MCV: 92.6 fL (ref 80.0–100.0)
Platelets: 215 10*3/uL (ref 150–400)
RBC: 3.25 MIL/uL — ABNORMAL LOW (ref 3.87–5.11)
RDW: 14.6 % (ref 11.5–15.5)
WBC: 5.9 10*3/uL (ref 4.0–10.5)
nRBC: 0 % (ref 0.0–0.2)

## 2019-04-27 LAB — COMPREHENSIVE METABOLIC PANEL
ALT: 17 U/L (ref 0–44)
AST: 14 U/L — ABNORMAL LOW (ref 15–41)
Albumin: 2.5 g/dL — ABNORMAL LOW (ref 3.5–5.0)
Alkaline Phosphatase: 56 U/L (ref 38–126)
Anion gap: 11 (ref 5–15)
BUN: 12 mg/dL (ref 8–23)
CO2: 26 mmol/L (ref 22–32)
Calcium: 8.5 mg/dL — ABNORMAL LOW (ref 8.9–10.3)
Chloride: 107 mmol/L (ref 98–111)
Creatinine, Ser: 0.81 mg/dL (ref 0.44–1.00)
GFR calc Af Amer: >60 mL/min (ref >60)
GFR calc non Af Amer: >60 mL/min (ref >60)
Glucose, Bld: 98 mg/dL (ref 70–99)
Potassium: 3.9 mmol/L (ref 3.5–5.1)
Sodium: 144 mmol/L (ref 135–145)
Total Bilirubin: 0.8 mg/dL (ref 0.3–1.2)
Total Protein: 5.9 g/dL — ABNORMAL LOW (ref 6.5–8.1)

## 2019-04-27 LAB — PREALBUMIN: Prealbumin: 13.2 mg/dL — ABNORMAL LOW (ref 18–38)

## 2019-04-27 SURGERY — ESOPHAGOGASTRODUODENOSCOPY (EGD) WITH PROPOFOL
Anesthesia: Monitor Anesthesia Care

## 2019-04-27 MED ORDER — SPOT INK MARKER SYRINGE KIT
PACK | SUBMUCOSAL | Status: AC
  Filled 2019-04-27: qty 5

## 2019-04-27 MED ORDER — PROPOFOL 10 MG/ML IV BOLUS
INTRAVENOUS | Status: DC | PRN
  Administered 2019-04-27: 40 mg via INTRAVENOUS
  Administered 2019-04-27: 20 mg via INTRAVENOUS

## 2019-04-27 MED ORDER — SODIUM CHLORIDE 0.9 % IV SOLN: INTRAVENOUS | Status: DC

## 2019-04-27 MED ORDER — LACTATED RINGERS IV SOLN: INTRAVENOUS | Status: DC | PRN

## 2019-04-27 MED ORDER — ENSURE ENLIVE PO LIQD: 237.0000 mL | Freq: Three times a day (TID) | ORAL | Status: DC

## 2019-04-27 MED ORDER — IOHEXOL 350 MG/ML SOLN
100.0000 mL | Freq: Once | INTRAVENOUS | Status: AC | PRN
  Administered 2019-04-27: 100 mL via INTRAVENOUS

## 2019-04-27 MED ORDER — PROPOFOL 500 MG/50ML IV EMUL
INTRAVENOUS | Status: DC | PRN
  Administered 2019-04-27: 100 ug/kg/min via INTRAVENOUS

## 2019-04-27 MED ORDER — PHENYLEPHRINE HCL-NACL 10-0.9 MG/250ML-% IV SOLN
INTRAVENOUS | Status: DC | PRN
  Administered 2019-04-27: 20 ug/min via INTRAVENOUS

## 2019-04-27 MED ORDER — SPOT INK MARKER SYRINGE KIT
PACK | SUBMUCOSAL | Status: DC | PRN
  Administered 2019-04-27 (×2): 5 mL via SUBMUCOSAL

## 2019-04-27 SURGICAL SUPPLY — 25 items

## 2019-04-27 NOTE — Op Note (Signed)
[facility] Patient Name: Kristina [name] Procedure Date : 04/27/2019 MRN: [id number] Attending MD: [name] , MD Date of Birth: July 11, 1945 CSN: [id number] Age: 74 Admit Type: Inpatient Procedure:                Colonoscopy Indications:              Hematochezia Providers:                Lajuan Lines. [name], MD, Elmer Ramp. Tilden Dome, RN, Laverda Sorenson, Technician, Luane School, CRNA Referring MD:             [facility] Medicines:                Monitored Anesthesia Care Complications:            No immediate complications. Estimated Blood Loss:     Estimated blood loss was minimal. Procedure:                Pre-Anesthesia Assessment:                           - Prior to the procedure, a History and Physical                            was performed, and patient medications and                            allergies were reviewed. The patient's tolerance of                            previous anesthesia was also reviewed. The risks                            and benefits of the procedure and the sedation                            options and risks were discussed with the patient.                            All questions were answered, and informed consent                            was obtained. Prior Anticoagulants: The patient has                            taken Plavix (clopidogrel) and Xarelto, last dose                            was 2 days prior to procedure. ASA Grade                            Assessment: III - A patient with severe systemic  disease. After reviewing the risks and benefits,                            the patient was deemed in satisfactory condition to                            undergo the procedure.                           After obtaining informed consent, the colonoscope                            was passed under direct vision. Throughout the                            procedure, the  patient's blood pressure, pulse, and                            oxygen saturations were monitored continuously. The                            [id number] Olympus pediatric colonoscope                            was introduced through the anus and advanced to the                            cecum, identified by appendiceal orifice and                            ileocecal valve. The colonoscopy was performed                            without difficulty. The patient tolerated the                            procedure well. The quality of the bowel                            preparation was fair. The ileocecal valve,                            appendiceal orifice, and rectum were photographed. Scope In: 2:52:28 PM Scope Out: 3:33:16 PM Scope Withdrawal Time: 0 hours 31 minutes 21 seconds  Total Procedure Duration: 0 hours 40 minutes 48 seconds  Findings:      The digital rectal exam was normal.      A frond-like/villous and fungating non-obstructing mass was found in the       distal sigmoid colon. The mass was non-circumferential. The mass is       approximately 3-4 cm in size and located 25 cm from the dentate line.       Oozing with adherent blood was present. This was biopsied multiple times       with a cold forceps for histology. Area distal to the mass was tattooed  with 3 injections in circumferential fashion totalling 5 mL of Spot       (carbon black).      Internal hemorrhoids were found during retroflexion. The hemorrhoids       were medium-sized.      The exam was otherwise without abnormality, limited by residual stool in       some colonic segments. No other tumors seen. Impression:               - Preparation of the colon was fair.                           - Probable malignant tumor in the distal sigmoid                            colon. Biopsied. Tattooed.                           - Internal hemorrhoids.                           - The examination was  otherwise normal where not                            limited by residual stool which could not be                            cleared. Moderate Sedation:      N/A Recommendation:           - Return patient to hospital ward for ongoing care.                           - Advance diet as tolerated.                           - Await pathology results.                           - Surgical consultation.                           - Hold Xarelto for now.                           - CT abd/pelvis with contrast. Repeat CT                            angiography of chest to re-evaluate for pulmonary                            embolism (seen on 04/10/2019) given the need for                            surgery and decisions to be made regarding                            anticoagulation.                           -  Repeat colonoscopy with 2 day preparation is                            recommended as an outpatient 2-3 months after                            surgical resection of sigmoid tumor. Procedure Code(s):        --- Professional ---                           [phone], Colonoscopy, flexible; with directed                            submucosal injection(s), any substance                           [id number], Colonoscopy, flexible; with biopsy, single                            or multiple Diagnosis Code(s):        --- Professional ---                           K64.8, Other hemorrhoids                           C18.7, Malignant neoplasm of sigmoid colon                           K92.1, Melena (includes Hematochezia) CPT copyright 2019 American Medical Association. All rights reserved. The codes documented in this report are preliminary and upon coder review may  be revised to meet current compliance requirements. [name], MD 04/27/2019 4:09:39 PM This report has been signed electronically. Number of Addenda: 0

## 2019-04-27 NOTE — Transfer of Care (Signed)
Immediate Anesthesia Transfer of Care Note  Patient: FATOUMATA ALBAUGH  Procedure(s) Performed: ESOPHAGOGASTRODUODENOSCOPY (EGD) WITH PROPOFOL (N/A ) COLONOSCOPY WITH PROPOFOL (N/A ) BIOPSY SCLEROTHERAPY  Patient Location: Endoscopy Unit  Anesthesia Type:MAC  Level of Consciousness: awake, oriented and patient cooperative  Airway & Oxygen Therapy: Patient Spontanous Breathing  Post-op Assessment: Report given to RN and Post -op Vital signs reviewed and stable  Post vital signs: Reviewed and stable  Last Vitals:  Vitals Value Taken Time  BP    Temp    Pulse    Resp    SpO2      Last Pain:  Vitals:   04/27/19 1421  TempSrc:   PainSc: 0-No pain         Complications: No apparent anesthesia complications

## 2019-04-27 NOTE — Anesthesia Preprocedure Evaluation (Signed)
Anesthesia Evaluation  Patient identified by MRN, date of birth, ID band Patient awake    Reviewed: Allergy & Precautions, NPO status , Patient's Chart, lab work & pertinent test results  Airway Mallampati: II  TM Distance: >3 FB     Dental  (+) Dental Advisory Given   Pulmonary asthma ,  S/p covid PNA   breath sounds clear to auscultation       Cardiovascular hypertension, Pt. on medications + CAD   Rhythm:Regular Rate:Normal     Neuro/Psych Seizures -,   Neuromuscular disease    GI/Hepatic Neg liver ROS, GI bleed    Endo/Other  negative endocrine ROS  Renal/GU negative Renal ROS     Musculoskeletal  (+) Arthritis ,   Abdominal   Peds  Hematology  (+) anemia ,   Anesthesia Other Findings   Reproductive/Obstetrics                             Anesthesia Physical Anesthesia Plan  ASA: III  Anesthesia Plan: MAC   Post-op Pain Management:    Induction: Intravenous  PONV Risk Score and Plan: 2 and Propofol infusion, Ondansetron and Treatment may vary due to age or medical condition  Airway Management Planned: Natural Airway and Nasal Cannula  Additional Equipment:   Intra-op Plan:   Post-operative Plan:   Informed Consent: I have reviewed the patients History and Physical, chart, labs and discussed the procedure including the risks, benefits and alternatives for the proposed anesthesia with the patient or authorized representative who has indicated his/her understanding and acceptance.       Plan Discussed with: CRNA  Anesthesia Plan Comments:         Anesthesia Quick Evaluation

## 2019-04-27 NOTE — Anesthesia Procedure Notes (Signed)
Procedure Name: MAC Date/Time: 04/27/2019 2:28 PM Performed by: Renato Shin, CRNA Pre-anesthesia Checklist: Patient identified, Emergency Drugs available, Suction available and Patient being monitored Patient Re-evaluated:Patient Re-evaluated prior to induction Oxygen Delivery Method: Simple face mask Preoxygenation: Pre-oxygenation with 100% oxygen Induction Type: IV induction Placement Confirmation: positive ETCO2 and breath sounds checked- equal and bilateral Dental Injury: Teeth and Oropharynx as per pre-operative assessment

## 2019-04-27 NOTE — Progress Notes (Signed)
Patient off floor to Endo,

## 2019-04-27 NOTE — Progress Notes (Signed)
Patient arrived back to room in NAD, VS stable. Patient free from pain.

## 2019-04-27 NOTE — Interval H&P Note (Signed)
History and Physical Interval Note: For upper endoscopy and colonoscopy today with monitored anesthesia care to evaluate recent GI bleeding. The nature of the procedure, as well as the risks, benefits, and alternatives were carefully and thoroughly reviewed with the patient. Ample time for discussion and questions allowed. The patient understood, was satisfied, and agreed to proceed.  Xarelto has been on hold, Plavix was discontinued 2 days ago  CBC Latest Ref Rng & Units 04/27/2019 04/26/2019 04/26/2019  WBC 4.0 - 10.5 K/uL 5.9 6.1 -  Hemoglobin 12.0 - 15.0 g/dL 9.8(L) 10.7(L) 10.4(L)  Hematocrit 36.0 - 46.0 % 30.1(L) 32.9(L) 32.3(L)  Platelets 150 - 400 K/uL 215 238 -       04/27/2019 2:25 PM  Kristina [name]  has presented today for surgery, with the diagnosis of melena, rectal bleeding.  The various methods of treatment have been discussed with the patient and family. After consideration of risks, benefits and other options for treatment, the patient has consented to  Procedure(s): ESOPHAGOGASTRODUODENOSCOPY (EGD) WITH PROPOFOL (N/A) COLONOSCOPY WITH PROPOFOL (N/A) as a surgical intervention.  The patient's history has been reviewed, patient examined, no change in status, stable for surgery.  I have reviewed the patient's chart and labs.  Questions were answered to the patient's satisfaction.     [name]

## 2019-04-27 NOTE — Progress Notes (Signed)
PROGRESS NOTE   Kristina [name]  [id number]    DOB: 1946-02-14    DOA: 04/25/2019  PCP: Nolene Ebbs, MD   I have briefly reviewed patients previous medical records in [facility].  Chief Complaint:   Chief Complaint  Patient presents with  . Weakness  . Rectal Bleeding    Brief Narrative:   74 year old female, lives with her family, independent, PMH of CAD s/p stent (2018), HTN, hyperlipidemia, chronic diastolic CHF, obesity, remote history of seizures, recent hospitalization 04/10/2019-04/15/2018 for COVID-19 viral pneumonia complicated by acute segmental pulmonary embolism at which time she completed 5 days of remdesivir, has completed Decadron, was discharged on Xarelto, had been constipated for 2 to 3 days for which she called her PCP and took some medications following which she had a BM on 1/13 which she reports as dark brown color with a very small amount of blood.  On 1/15, patient had nearly been discharged and then she had a BM suggestive of melena and bright red blood.  Discharge was canceled.  GI performed EGD and colonoscopy 1/16, suspected sigmoid colon cancer, general surgery consulted.   Assessment & Plan:  Active Problems:   Near syncope   Rectal bleeding   Platelet inhibition due to Plavix   COVID-19 virus infection   GI bleeding   Melena   Malignant tumor of sigmoid colon (HCC)   Acute hemorrhagic gastritis   1. GI bleeding: [facility] was consulted.  Based on history and clinical evaluation, initially cause of bleeding was felt to be due to outlet bleeding.  Patient had been nearly discharge on 1/15  and then she had a BM suggestive of melena and bright red blood.  Discharge was canceled.  GI performed EGD and colonoscopy 1/16, detailed results as below, suspected sigmoid colon cancer, general surgery consulted.  Getting CT abdomen and CTA chest to reassess PE seen on 12/30 to determine clearance for surgery and need for continued anticoagulation and for  metastatic spread.  Plavix has been discontinued indefinitely after discussing with cardiology.  Aspirin and Xarelto currently on hold. 2. Near syncope: Could be related to orthostatic hypotension.  No further symptoms pertaining to this.  Telemetry shows sinus rhythm without abnormalities.  However if she continues to have persistent symptoms despite adjusting medications, recommend close outpatient follow-up with PCP/cardiology for further evaluation 3. Orthostatic hypotension: Unclear etiology.  Clinically appears euvolemic.  Vitals on admission, supine (94/58, 68/min), sitting (83/56, 76/min), standing (72/47, 80/min).  On recent admission, amlodipine and metoprolol were advised discontinued due to sinus bradycardia in the high 40s.  However it appears that patient continued to take these medications.  Currently blood pressures controlled on Lasix and losartan alone, continue.  Continue to hold metoprolol and amlodipine until close outpatient follow-up with PCP.  Repeated vitals at this time show negative orthostatic changes. 4. Recent COVID-19 pneumonia: Seems to have recovered fully from this.  No respiratory symptoms or hypoxia.  Has been quarantining at home which she states is supposed to end on 1/20.  Asymptomatic. 5. Suspected right lower lobe segmental PE by CTA chest 04/10/2019: This could be due to COVID-19 related hypercoagulable state.  However radiologist also opines that this could be some beam hardening artifact.  Patient had been discharged on Xarelto.  Xarelto currently on hold due to rectal bleeding from suspected sigmoid colon cancer.  Repeating CTA chest to reassess PE which was a small one on previous CT. 6. Hilar lymphadenopathy: Noted on same CTA chest 04/10/2019.  Follow radiologist recommendations i.e. recommend repeating chest CT in 3 months to ensure stability or resolution of these findings.  7. CAD status post PCI: No anginal symptoms.  I communicated with the patient's primary  cardiologist who advised that since patient's PCI was a long time ago, okay to discontinue Plavix and continue aspirin alone while on Xarelto.  As stated above, beta-blockers held during recent admission for bradycardia and now held due to concern that her presyncope/orthostatic hypotension may be worsened by her ongoing use of polypharmacy antihypertensives. 8. Chronic diastolic CHF: Compensated.  Continue Lasix and losartan. 9. Essential hypertension: Controlled as noted below on Lasix and losartan alone. 10. Chronic normocytic anemia: Suspect that her baseline hemoglobin lately is in the mid 10 g range.  Despite reported GI bleed, hemoglobin remained stable.  Suspect that her anemia is multifactorial related to iron deficiency (anemia panel: Iron 52, TIBC 295, saturation ratio 18, ferritin 40,), possible chronic disease and recent acute illness.    Hemoglobin has slightly dropped to 9.8, follow-up in a.m.   Body mass index is 31.7 kg/m./Obesity  Nutritional Status Nutrition Problem: Increased nutrient needs Etiology: acute illness, catabolic illness(recent [id number]) Signs/Symptoms: estimated needs Interventions: Ensure Enlive (each supplement provides 350kcal and 20 grams of protein)  DVT prophylaxis: SCDs Code Status: Full Family Communication: None at bedside.  GI MD has communicated with family post procedures. Disposition: To be determined pending surgical evaluation.   Consultants:   [facility] General surgery  Procedures:   Colonoscopy 1/16:  Impression:  - Preparation of the colon was fair. - Probable malignant tumor in the distal sigmoid colon. Biopsied. Tattooed. - Internal hemorrhoids. - The examination was otherwise normal where not limited by residual stool which could not be cleared.  Recommendation:  - Return patient to hospital ward for ongoing care. - Advance diet as tolerated. - Await pathology results. - Surgical consultation. - Hold Xarelto for now. -  CT abd/pelvis with contrast. Repeat CT angiography of chest to re-evaluate for pulmonary embolism (seen on 04/10/2019) given the need for surgery and decisions to be made regarding anticoagulation. - Repeat colonoscopy with 2 day preparation is recommended as an outpatient 2-3 months after surgical resection of sigmoid tumor.  EGD 1/16:  Impression:  - Normal esophagus. - Acute gastritis with shallow ulceration. Biopsied. - Normal examined duodenum.  Recommendation:  -Twice daily PPI for 4 weeks, then once daily thereafter -Await pathology results.   Antimicrobials:   None  Subjective:  Seen this morning prior to procedures.  Had been ambulating in the room without dizziness, lightheadedness, feeling like passing out chest pain or dyspnea.  Had had 3-4 BMs and I witnessed the last 1 in the toilet bowl which was brown color without melena or blood.    Objective:   Vitals:   04/27/19 1544 04/27/19 1548 04/27/19 1556 04/27/19 1601  BP: 130/80 129/81  129/83  Pulse: 82 79 77 78  Resp: 10 12 12    Temp: 97.6 F (36.4 C)     TempSrc: Oral     SpO2:      Weight:      Height:         General: Pleasant elderly female, moderately built and obese seen ambulating comfortably in her room without distress. Cardiovascular: S1 & S2 heard, RRR, S1/S2 +. No murmurs, rubs, gallops or clicks. No JVD or pedal edema.  Telemetry personally reviewed: Sinus rhythm. Respiratory: Clear to auscultation without wheezing, rhonchi or crackles. No increased work of breathing. Abdominal:  Non distended, non tender & soft. No organomegaly or masses appreciated. Normal bowel sounds heard. CNS: Alert and oriented. No focal deficits. Extremities: no edema, no cyanosis    Data Reviewed:   I have personally reviewed following labs and imaging studies   CBC: Recent Labs  Lab 04/25/19 1100 04/25/19 1446 04/26/19 0718 04/26/19 2204 04/27/19 0831  WBC 7.5  --   --  6.1 5.9  NEUTROABS 5.5  --    --   --   --   HGB 10.7*   < > 10.4* 10.7* 9.8*  HCT 34.3*   < > 32.3* 32.9* 30.1*  MCV 96.6  --   --  93.2 92.6  PLT 248  --   --  238 215   < > = values in this interval not displayed.    Basic Metabolic Panel: Recent Labs  Lab 04/25/19 1100 04/27/19 0831  NA 139 144  K 3.6 3.9  CL 102 107  CO2 26 26  GLUCOSE 108* 98  BUN 19 12  CREATININE 0.95 0.81  CALCIUM 8.6* 8.5*    Liver Function Tests: Recent Labs  Lab 04/25/19 1100 04/27/19 0831  AST 17 14*  ALT 21 17  ALKPHOS 55 56  BILITOT 1.0 0.8  PROT 5.9* 5.9*  ALBUMIN 2.6* 2.5*    CBG: Recent Labs  Lab 04/25/19 1006  GLUCAP 98    Microbiology Studies:  No results found for this or any previous visit (from the past 240 hour(s)).   Radiology Studies:  No results found.   Scheduled Meds:   . atorvastatin  80 mg Oral q1800  . cholecalciferol  5,000 Units Oral Daily  . feeding supplement (ENSURE ENLIVE)  237 mL Oral TID BM  . furosemide  20 mg Oral Daily  . losartan  50 mg Oral Daily  . pantoprazole  40 mg Oral BID    Continuous Infusions:     LOS: 1 day     Vernell Leep, MD, [place], [facility]. [facility]    To contact the attending provider between 7A-7P or the covering provider during after hours 7P-7A, please log into the web site [email or web address] and access using universal  password for that web site. If you do not have the password, please call the hospital operator.  04/27/2019, 6:14 PM

## 2019-04-27 NOTE — Op Note (Signed)
[facility] Patient Name: Kristina [name] Procedure Date : 04/27/2019 MRN: [id number] Attending MD: [name] , MD Date of Birth: 1946-01-28 CSN: [id number] Age: 74 Admit Type: Inpatient Procedure:                Upper GI endoscopy Indications:              Acute post hemorrhagic anemia, Hematochezia, Melena Providers:                Lajuan Lines. [name], MD, Tory Emerald, RN, Laverda Sorenson,                            Technician, Luane School, CRNA Referring MD:             [facility] Medicines:                Monitored Anesthesia Care Complications:            No immediate complications. Estimated Blood Loss:     Estimated blood loss was minimal. Procedure:                Pre-Anesthesia Assessment:                           - Prior to the procedure, a History and Physical                            was performed, and patient medications and                            allergies were reviewed. The patient's tolerance of                            previous anesthesia was also reviewed. The risks                            and benefits of the procedure and the sedation                            options and risks were discussed with the patient.                            All questions were answered, and informed consent                            was obtained. Prior Anticoagulants: The patient has                            taken Plavix (clopidogrel) and Xarelto, last dose                            was 2 days prior to procedure. ASA Grade                            Assessment: III - A patient with severe systemic  disease. After reviewing the risks and benefits,                            the patient was deemed in satisfactory condition to                            undergo the procedure.                           After obtaining informed consent, the endoscope was                            passed under direct vision. Throughout the                         procedure, the patient's blood pressure, pulse, and                            oxygen saturations were monitored continuously. The                            [id number] Olympus gastroscope was                            introduced through the mouth, and advanced to the                            second part of duodenum. The upper GI endoscopy was                            accomplished without difficulty. The patient                            tolerated the procedure well. Scope In: Scope Out: Findings:      The examined esophagus was normal.      Moderate inflammation characterized by erythema, granularity and shallow       ulcerations was found in the gastric antrum and in the prepyloric region       of the stomach. Biopsies were taken with a cold forceps for histology       and Helicobacter pylori testing.      The examined duodenum was normal. Impression:               - Normal esophagus.                           - Acute gastritis with shallow ulceration. Biopsied.                           - Normal examined duodenum. Moderate Sedation:      N/A Recommendation:           - Return patient to hospital ward for ongoing care.                           - Advance diet as tolerated.                           -  Continue present medications.                           - BID PPI for 4 weeks, then once daily thereafter.                           - Await pathology results.                           - See other procedure note from today (colonoscopy). Procedure Code(s):        --- Professional ---                           [id number], Esophagogastroduodenoscopy, flexible,                            transoral; with biopsy, single or multiple Diagnosis Code(s):        --- Professional ---                           K29.00, Acute gastritis without bleeding                           D62, Acute posthemorrhagic anemia                           K92.1, Melena (includes  Hematochezia) CPT copyright 2019 [employer]. All rights reserved. The codes documented in this report are preliminary and upon coder review may  be revised to meet current compliance requirements. [name], MD 04/27/2019 4:01:18 PM This report has been signed electronically. Number of Addenda: 0

## 2019-04-27 NOTE — Procedures (Addendum)
I called Laneta Wiginton, the patient's son, at her request after the upper endoscopy and colonoscopy today. We discussed the findings and plan I made him aware that I am concerned that she has sigmoid colon cancer and that we would be repeating a CT of her chest PE protocol and an abdomen and pelvis CT.  We are consulting general surgery for consideration of sigmoidectomy. Time provided for questions and answers and he thanked me for the call

## 2019-04-28 DIAGNOSIS — U071 COVID-19: Secondary | ICD-10-CM

## 2019-04-28 DIAGNOSIS — Z01810 Encounter for preprocedural cardiovascular examination: Secondary | ICD-10-CM

## 2019-04-28 DIAGNOSIS — I2510 Atherosclerotic heart disease of native coronary artery without angina pectoris: Secondary | ICD-10-CM

## 2019-04-28 LAB — CBC
HCT: 28.7 % — ABNORMAL LOW (ref 36.0–46.0)
Hemoglobin: 9.2 g/dL — ABNORMAL LOW (ref 12.0–15.0)
MCH: 30.1 pg (ref 26.0–34.0)
MCHC: 32.1 g/dL (ref 30.0–36.0)
MCV: 93.8 fL (ref 80.0–100.0)
Platelets: 217 10*3/uL (ref 150–400)
RBC: 3.06 MIL/uL — ABNORMAL LOW (ref 3.87–5.11)
RDW: 14.7 % (ref 11.5–15.5)
WBC: 8.6 10*3/uL (ref 4.0–10.5)
nRBC: 0 % (ref 0.0–0.2)

## 2019-04-28 LAB — TYPE AND SCREEN
ABO/RH(D): O POS
Antibody Screen: NEGATIVE

## 2019-04-28 LAB — CEA: CEA: 3 ng/mL (ref 0.0–4.7)

## 2019-04-28 NOTE — Consult Note (Signed)
Cardiology Consult    Patient ID: ADAMARI GOULETTE; [id number]; January 06, 1946   Admit date: 04/25/2019 Date of Consult: 04/28/2019  Primary Care Provider: Nolene Ebbs, MD Primary Cardiologist: Quay Burow, MD   Patient Profile    Kristina [name] is a 74 y.o. female with past medical history of CAD (s/p NSTEMI in 06/2016 with DES to proximal LAD and severe LCX disease complicated by severe angulation proximal to the lesion with medical management recommended), HTN and HLD who is being seen today for the evaluation of preoperative cardiac clearance at the request of Dr. Algis Liming.   History of Present Illness    Kristina [name] was last examined by Dr. Gwenlyn Found in 12/2018 and denied any recent chest pain or dyspnea on exertion. Was continued on her current regimen at that time including ASA, Plavix, Lopressor, Amlodipine, Losartan and statin therapy.   In the interim, she was admitted to [facility] from 12/30 - 04/16/2019 for COVID PNA and was treated with Remdesivir and Decadron. She was found to have a segmental PE on admission and was stared on Heparin and transitioned to Xarelto at the time of discharge. Was continued on ASA and Plavix with recommendatons to follow-up with Cardiology in regards to continuing these given the use of Xarelto. Lopressor was discontinued due to bradycardia and Amlodipine was stopped due to hypotension.   She presented back to the ED on 04/25/2019 for worsening weakness for the past 2 weeks. Orthostatics were checked in the ED by review of notes and were positive. She did report having one episode of hematochezia with Hgb at 10.7 and FOBT was positive. Was evaluated by GI and given no recurrent bleeding, was recommended to have outpatient workup once recovered from [place] and recent PE. However, prior to discharge from the hospital she had recurrent melena and ASA, Plavix and Xarelto were held and GI was again to see the patient again.    Underwent EGD and Colonoscopy on  1/16 with EGD showing acute gastritis with shallow ulceration. Colonoscopy showed a 3-4 cm mass in the sigmoid colon suspicious for malignancy with biopsy results pending. General Surgery has been consulted and are planning for likely resection next week once pathology confirmed. Cardiology consulted for clearance.    Past Medical History:  Diagnosis Date  . Arthritis    "right arm" (06/22/2016)  . Carpal tunnel syndrome   . Childhood asthma   . Hypertension   . Plantar fasciitis, bilateral    "had shots in them" (06/22/2016)  . Seizures ([place])    "when I was young" (06/22/2016)  . Wears glasses     Past Surgical History:  Procedure Laterality Date  . CARDIAC CATHETERIZATION    . CARPAL TUNNEL RELEASE Left 03/10/2016   Procedure: CARPAL TUNNEL RELEASE, left;  Surgeon: Daryll Brod, MD;  Location: [facility];  Service: Orthopedics;  Laterality: Left;  . COLONOSCOPY    . CORONARY STENT INTERVENTION N/A 06/23/2016   Procedure: Coronary Stent Intervention;  Surgeon: Sherren Mocha, MD;  Location: [facility];  Service: Cardiovascular;  Laterality: N/A;  . DILATION AND CURETTAGE OF UTERUS    . FRACTURE SURGERY    . LEFT HEART CATH AND CORONARY ANGIOGRAPHY N/A 06/23/2016   Procedure: Left Heart Cath and Coronary Angiography;  Surgeon: Sherren Mocha, MD;  Location: [facility];  Service: Cardiovascular;  Laterality: N/A;  . ORIF WRIST FRACTURE  10/11   left  . TRIGGER FINGER RELEASE Right 02/27/2013   Procedure:  RELEASE TRIGGER RIGHT MIDDLE FINGER ;  Surgeon: Wynonia Sours, MD;  Location: [facility];  Service: Orthopedics;  Laterality: Right;  . ULNAR NERVE TRANSPOSITION Left 03/10/2016   Procedure: ULNAR NERVE DECOMPRESSION possible TRANSPOSITION;  Surgeon: Daryll Brod, MD;  Location: [facility];  Service: Orthopedics;  Laterality: Left;  [name] VAGINAL HYSTERECTOMY     "took qthing except 1 ovary"       Inpatient Medications: Scheduled  Meds: . atorvastatin  80 mg Oral q1800  . cholecalciferol  5,000 Units Oral Daily  . feeding supplement (ENSURE ENLIVE)  237 mL Oral TID BM  . furosemide  20 mg Oral Daily  . losartan  50 mg Oral Daily  . pantoprazole  40 mg Oral BID   Continuous Infusions:  PRN Meds: albuterol, nitroGLYCERIN  Allergies:    Allergies  Allergen Reactions  . Tramadol Itching    Social History:   Social History   Socioeconomic History  . Marital status: Divorced    Spouse name: Not on file  . Number of children: Not on file  . Years of education: Not on file  . Highest education level: Not on file  Occupational History  . Not on file  Tobacco Use  . Smoking status: Never Smoker  . Smokeless tobacco: Never Used  Substance and Sexual Activity  . Alcohol use: Yes    Alcohol/week: 6.0 standard drinks    Types: 6 Glasses of wine per week  . Drug use: No  . Sexual activity: Not Currently  Other Topics Concern  . Not on file  Social History Narrative  . Not on file   Social Determinants of Health   Financial Resource Strain:   . Difficulty of Paying Living Expenses: Not on file  Food Insecurity:   . Worried About Charity fundraiser in the Last Year: Not on file  . Ran Out of Food in the Last Year: Not on file  Transportation Needs:   . Lack of Transportation (Medical): Not on file  . Lack of Transportation (Non-Medical): Not on file  Physical Activity:   . Days of Exercise per Week: Not on file  . Minutes of Exercise per Session: Not on file  Stress:   . Feeling of Stress : Not on file  Social Connections:   . Frequency of Communication with Friends and Family: Not on file  . Frequency of Social Gatherings with Friends and Family: Not on file  . Attends Religious Services: Not on file  . Active Member of Clubs or Organizations: Not on file  . Attends Archivist Meetings: Not on file  . Marital Status: Not on file  Intimate Partner Violence:   . Fear of Current or  Ex-Partner: Not on file  . Emotionally Abused: Not on file  . Physically Abused: Not on file  . Sexually Abused: Not on file     Family History:    Family History  Problem Relation Age of Onset  . Stroke Mother 26  . Heart attack Mother 71  . Lung cancer Father   . Lung cancer Brother       Review of Systems    General:  No chills, fever, night sweats or weight changes. Positive for fatigue and weakness. Cardiovascular:  No chest pain, dyspnea on exertion, edema, orthopnea, palpitations, paroxysmal nocturnal dyspnea. Dermatological: No rash, lesions/masses Respiratory: No cough, dyspnea Urologic: No hematuria, dysuria Abdominal:   No nausea, vomiting, diarrhea, bright red blood per rectum,  melena, or hematemesis. Positive for melena.  Neurologic:  No visual changes, changes in mental status. All other systems reviewed and are otherwise negative except as noted above.  Physical Exam/Data    Vitals:   04/27/19 1556 04/27/19 1601 04/27/19 2134 04/28/19 0518  BP:  129/83 104/79 113/67  Pulse: 77 78 (!) 102 92  Resp: 12  20 19   Temp:   99.1 F (37.3 C) 98.6 F (37 C)  TempSrc:   Oral Oral  SpO2:   97% 99%  Weight:    88.1 kg  Height:        Intake/Output Summary (Last 24 hours) at 04/28/2019 0823 Last data filed at 04/27/2019 1800 Gross per 24 hour  Intake 960 ml  Output --  Net 960 ml   Filed Weights   04/25/19 2220 04/27/19 1421 04/28/19 0518  Weight: 89.1 kg 89.1 kg 88.1 kg   Body mass index is 31.35 kg/m.   Affect appropriate Healthy:  appears stated age 4: normal Neck supple with no adenopathy JVP normal no bruits no thyromegaly Lungs clear with no wheezing and good diaphragmatic motion Heart:  S1/S2 no murmur, no rub, gallop or click PMI normal Abdomen: benighn, BS positve, no tenderness, no AAA no bruit.  No HSM or HJR Distal pulses intact with no bruits No edema Neuro non-focal Skin warm and dry No muscular weakness    EKG:  The EKG was  personally reviewed and demonstrates: NSR, HR 70 with no acute ST abnormalities when compared to prior tracings.    Labs/Studies     Relevant CV Studies:  Cardiac Catheterization: 06/23/2016  1. Severe 2 vessel coronary artery disease with critical stenosis of the proximal LAD and severe stenosis of the mid circumflex 2. Successful PCI to proximal LAD using a 4.0 mm Synergy DES postdilated with a 4.5 mm noncompliant balloon 3. Patent RCA with nonobstructive disease of line 4. Normal LV systolic function  Recommendations:  Dual antiplatelet therapy with aspirin and brilinta for a minimum of 1 year  Medical therapy for her residual CAD  The left circumflex is severely diseased, but there is severe angulation proximal to the lesion. It might be best to treat her medically and if refractory angina occurs she could be considered for PCI of the circumflex.  Laboratory Data:  Chemistry Recent Labs  Lab 04/25/19 1100 04/27/19 0831  NA 139 144  K 3.6 3.9  CL 102 107  CO2 26 26  GLUCOSE 108* 98  BUN 19 12  CREATININE 0.95 0.81  CALCIUM 8.6* 8.5*  GFRNONAA >60 >60  GFRAA >60 >60  ANIONGAP 11 11    Recent Labs  Lab 04/25/19 1100 04/27/19 0831  PROT 5.9* 5.9*  ALBUMIN 2.6* 2.5*  AST 17 14*  ALT 21 17  ALKPHOS 55 56  BILITOT 1.0 0.8   Hematology Recent Labs  Lab 04/26/19 2204 04/27/19 0831 04/28/19 0357  WBC 6.1 5.9 8.6  RBC 3.53* 3.25* 3.06*  HGB 10.7* 9.8* 9.2*  HCT 32.9* 30.1* 28.7*  MCV 93.2 92.6 93.8  MCH 30.3 30.2 30.1  MCHC 32.5 32.6 32.1  RDW 14.6 14.6 14.7  PLT 238 215 217   Cardiac EnzymesNo results for input(s): TROPONINI in the last 168 hours. No results for input(s): TROPIPOC in the last 168 hours.  BNPNo results for input(s): BNP, PROBNP in the last 168 hours.  DDimer No results for input(s): DDIMER in the last 168 hours.  Radiology/Studies:  CT ANGIO CHEST PE W OR WO CONTRAST  Result Date: 04/27/2019 CLINICAL DATA:  Shortness of breath.  EXAM: CT ANGIOGRAPHY CHEST WITH CONTRAST TECHNIQUE: Multidetector CT imaging of the chest was performed using the standard protocol during bolus administration of intravenous contrast. Multiplanar CT image reconstructions and MIPs were obtained to evaluate the vascular anatomy. CONTRAST:  159mL OMNIPAQUE IOHEXOL 350 MG/ML SOLN COMPARISON:  04/10/2019 FINDINGS: Cardiovascular: Contrast injection is sufficient to demonstrate satisfactory opacification of the pulmonary arteries to the segmental level. There is no pulmonary embolus. The main pulmonary artery is within normal limits for size. There is no CT evidence of acute right heart strain. The visualized aorta is normal. Heart size is enlarged. Coronary artery calcifications are noted. There is no significant pericardial effusion. Mediastinum/Nodes: --No mediastinal or hilar lymphadenopathy. --No axillary lymphadenopathy. --No supraclavicular lymphadenopathy. --Normal thyroid gland. --The esophagus is unremarkable Lungs/Pleura: There are new scattered pulmonary opacities bilaterally, greatest within the lower lobes. There is no pneumothorax. There is no significant pleural effusion. The trachea is unremarkable. Upper Abdomen: No acute abnormality. Musculoskeletal: No chest wall abnormality. No acute or significant osseous findings. Review of the MIP images confirms the above findings. IMPRESSION: 1. No evidence of acute pulmonary embolus. 2. New scattered pulmonary opacities bilaterally, greatest within the lower lobes, consistent with the patient's history of COVID-19 pneumonia. Electronically Signed   By: Constance Holster M.D.   On: 04/27/2019 22:45   CT ABDOMEN PELVIS W CONTRAST  Result Date: 04/28/2019 CLINICAL DATA:  colorectal cancer, staging sigmoid mass EXAM: CT ABDOMEN AND PELVIS WITH CONTRAST TECHNIQUE: Multidetector CT imaging of the abdomen and pelvis was performed using the standard protocol following bolus administration of intravenous contrast.  CONTRAST:  164mL OMNIPAQUE IOHEXOL 350 MG/ML SOLN COMPARISON:  None. FINDINGS: Lower chest: Nodular airspace opacities at the lung bases. Hepatobiliary: No focal hepatic lesion. No biliary duct dilatation. Gallbladder is normal. Common bile duct is normal. Pancreas: Pancreas is normal. No ductal dilatation. No pancreatic inflammation. Spleen: Normal spleen Adrenals/urinary tract: Adrenal glands normal. Simple fluid attenuation cyst of the LEFT kidney. Ureters and bladder normal. Stomach/Bowel: Stomach, duodenum and small bowel normal. Normal cecum. Appendix not identified. Ascending transverse descending colon normal. In the mid sigmoid colon, there is eccentric thickening measuring 3.2 cm by 2.5 cm (image 69/3). No evidence of obstruction proximal to lesion. There is no enlarged lymph node in the sigmoid mesocolon. No presacral adenopathy or iliac adenopathy. No retroperitoneal adenopathy. Vascular/Lymphatic: Abdominal aorta is normal caliber. No periportal or retroperitoneal adenopathy. No pelvic adenopathy. Reproductive: Post hysterectomy Other: No free fluid. Musculoskeletal: No aggressive osseous lesion. IMPRESSION: 1. Eccentric thickening in the mid sigmoid colon correlates to abnormal findings on colonoscopy. No bowel obstruction 2. No evidence of metastatic adenopathy within the sigmoid mesocolon or elsewhere in the abdomen or pelvis. 3. No liver metastasis. 4. Nodular airspace disease in the lower lobes consistent with COVID pneumonia Electronically Signed   By: Suzy Bouchard M.D.   On: 04/28/2019 05:27     Assessment & Plan    Preoperative Cardiac Exam:  CAD has been stable she is active still working for [employer] with no angina Plavix d/c Review of cath showed large 4.0 mm stent to LAD Did have residual small tortuous OM disease but no angina Given fact that she needs colon resection for cancer with risk of spread, continued bleeding or obstruction ok to proceed with surgery  when general surgery able to do EF was normal at time of cath and no history of arrhythmias or CHF.   COVID:  Hospitalized 12/31-1/5 with PE on xarelto now held. CTA 1/16 with signs of COVID infection no signs of acute PE [place] timing of general anesthesia for surgery up to primary team and surgery regarding respiratory status and risk of prolonged intubation     For questions or updates, please contact [place] Please consult [email or web address] for contact info under Cardiology/STEMI.  Signed, Erma Heritage, PA-C 04/28/2019, 8:23 AM Pager: [phone]

## 2019-04-28 NOTE — Consult Note (Signed)
[name] Consult Note  Kristina [name] 1945/05/10  [id number].    Requesting MD: [name] Chief Complaint/Reason for Consult: sigmoid colon Cancer HPI:  85F with h/o sz, CAD s/p stenting (01/2018), HTN, COVID requiring hospitalization 12/31-1/5, and PE on Xarelto p/w weakness, near syncope, and dark stool. She underwent colonoscopy yesterday (1/16) at which time, she was noted to have a 3-4cm, non-obstructing, fungating, distal sigmoid mass suspicious for malignancy. This was biopsied, circumferentially tattooed, and measured to be approximately 25cm from the dentate line. Patient currently denies abdominal pain and had a non-bloody stool this AM. She denies distention, n/v. No personal hx of malignancy. No family hx of colon cancer. Patient has noted blood in stools or dark stools prior to this. No past abdominal surgery. On xarelto for recent PE diagnosis. Patient allergic to tramadol. She denies tobacco or illicit drug use, reports rare alcohol use. She works in the [place] [employer], [place].   ROS: Review of Systems  Constitutional: Positive for malaise/fatigue. Negative for chills and fever.  Respiratory: Negative for shortness of breath and wheezing.   Cardiovascular: Negative for chest pain and palpitations.  Gastrointestinal: Positive for blood in stool. Negative for abdominal pain, constipation, nausea and vomiting.  Genitourinary: Negative for dysuria, frequency and urgency.  All other systems reviewed and are negative.   Family History  Problem Relation Age of Onset  . Stroke Mother 44  . Heart attack Mother 101  . Lung cancer Father   . Lung cancer Brother     Past Medical History:  Diagnosis Date  . Arthritis    "right arm" (06/22/2016)  . Carpal tunnel syndrome   . Childhood asthma   . Hypertension   . Plantar fasciitis, bilateral    "had shots in them" (06/22/2016)  . Seizures ([place])    "when I was young" (06/22/2016)  . Wears glasses     Past Surgical  History:  Procedure Laterality Date  . CARDIAC CATHETERIZATION    . CARPAL TUNNEL RELEASE Left 03/10/2016   Procedure: CARPAL TUNNEL RELEASE, left;  Surgeon: Daryll Brod, MD;  Location: [facility];  Service: Orthopedics;  Laterality: Left;  . COLONOSCOPY    . CORONARY STENT INTERVENTION N/A 06/23/2016   Procedure: Coronary Stent Intervention;  Surgeon: Sherren Mocha, MD;  Location: [facility];  Service: Cardiovascular;  Laterality: N/A;  . DILATION AND CURETTAGE OF UTERUS    . FRACTURE SURGERY    . LEFT HEART CATH AND CORONARY ANGIOGRAPHY N/A 06/23/2016   Procedure: Left Heart Cath and Coronary Angiography;  Surgeon: Sherren Mocha, MD;  Location: [facility];  Service: Cardiovascular;  Laterality: N/A;  . ORIF WRIST FRACTURE  10/11   left  . TRIGGER FINGER RELEASE Right 02/27/2013   Procedure: RELEASE TRIGGER RIGHT MIDDLE FINGER ;  Surgeon: Wynonia Sours, MD;  Location: [facility];  Service: Orthopedics;  Laterality: Right;  . ULNAR NERVE TRANSPOSITION Left 03/10/2016   Procedure: ULNAR NERVE DECOMPRESSION possible TRANSPOSITION;  Surgeon: Daryll Brod, MD;  Location: [facility];  Service: Orthopedics;  Laterality: Left;  [name] VAGINAL HYSTERECTOMY     "took qthing except 1 ovary"    Social History:  reports that she has never smoked. She has never used smokeless tobacco. She reports current alcohol use of about 6.0 standard drinks of alcohol per week. She reports that she does not use drugs.  Allergies:  Allergies  Allergen Reactions  . Tramadol Itching    Medications Prior  to Admission  Medication Sig Dispense Refill  . albuterol (VENTOLIN HFA) 108 (90 Base) MCG/ACT inhaler Inhale 2 puffs into the lungs 2 (two) times daily as needed for wheezing or shortness of breath. 8 g 0  . amLODipine (NORVASC) 5 MG tablet Take 5 mg by mouth daily.    [name] ascorbic acid (VITAMIN C) 500 MG tablet Take 1 tablet (500 mg total) by mouth daily. 60  tablet 0  . aspirin 81 MG chewable tablet Chew 1 tablet (81 mg total) by mouth daily. 30 tablet 0  . atorvastatin (LIPITOR) 80 MG tablet Take 1 tablet (80 mg total) by mouth daily at 6 PM. 30 tablet 0  . clopidogrel (PLAVIX) 75 MG tablet TAKE 1 TABLET BY MOUTH  DAILY (Patient taking differently: Take 75 mg by mouth daily. ) 90 tablet 3  . furosemide (LASIX) 20 MG tablet Take 20 mg by mouth daily.     [name] losartan (COZAAR) 50 MG tablet Take 50 mg by mouth daily.    . metoprolol tartrate (LOPRESSOR) 25 MG tablet Take 25 mg by mouth 2 (two) times daily.    . nitroGLYCERIN (NITROSTAT) 0.4 MG SL tablet Place 1 tablet (0.4 mg total) under the tongue every 5 (five) minutes as needed for chest pain. 25 tablet 2  . Rivaroxaban 15 & 20 MG TBPK Follow package directions: Take one 15mg  tablet by mouth twice a day. On day 22, switch to one 20mg  tablet once a day. Take with food. 51 each 0  . Vitamin D3 (VITAMIN D) 25 MCG tablet Take 5 tablets (5,000 Units total) by mouth daily. 60 tablet 0  . zinc sulfate 220 (50 Zn) MG capsule Take 1 capsule (220 mg total) by mouth daily. 60 capsule 0  . [START ON 05/04/2019] rivaroxaban (XARELTO) 20 MG TABS tablet Take 1 tablet (20 mg total) by mouth daily with supper. 60 tablet 0    Blood pressure 113/67, pulse 92, temperature 98.6 F (37 C), temperature source Oral, resp. rate 19, height 5\' 6"  (1.676 m), weight 88.1 kg, SpO2 99 %. Physical Exam: Physical Exam Constitutional:      General: She is not in acute distress.    Appearance: Normal appearance. She is not toxic-appearing.  HENT:     Head: Normocephalic and atraumatic.     Right Ear: External ear normal.     Left Ear: External ear normal.     Nose: Nose normal.     Mouth/Throat:     Mouth: Mucous membranes are moist.     Pharynx: Oropharynx is clear.  Eyes:     General: No scleral icterus.    Extraocular Movements: Extraocular movements intact.     Conjunctiva/sclera: Conjunctivae normal.     Pupils:  Pupils are equal, round, and reactive to light.  Cardiovascular:     Rate and Rhythm: Normal rate and regular rhythm.     Pulses: Normal pulses.  Pulmonary:     Effort: Pulmonary effort is normal.     Breath sounds: Normal breath sounds.  Abdominal:     General: Abdomen is flat. Bowel sounds are normal. There is no distension.     Palpations: Abdomen is soft.     Tenderness: There is no abdominal tenderness. There is no guarding or rebound.     Hernia: No hernia is present.  Musculoskeletal:        General: Normal range of motion.     Cervical back: Normal range of motion and neck supple.  Skin:  General: Skin is warm and dry.     Capillary Refill: Capillary refill takes less than 2 seconds.  Neurological:     General: No focal deficit present.     Mental Status: She is alert and oriented to person, place, and time.  Psychiatric:        Mood and Affect: Mood normal.        Behavior: Behavior normal.        Thought Content: Thought content normal.        Judgment: Judgment normal.     Results for orders placed or performed during the hospital encounter of 04/25/19 (from the past 48 hour(s))  CBC     Status: Abnormal   Collection Time: 04/26/19 10:04 PM  Result Value Ref Range   WBC 6.1 4.0 - 10.5 K/uL   RBC 3.53 (L) 3.87 - 5.11 MIL/uL   Hemoglobin 10.7 (L) 12.0 - 15.0 g/dL   HCT 32.9 (L) 36.0 - 46.0 %   MCV 93.2 80.0 - 100.0 fL   MCH 30.3 26.0 - 34.0 pg   MCHC 32.5 30.0 - 36.0 g/dL   RDW 14.6 11.5 - 15.5 %   Platelets 238 150 - 400 K/uL   nRBC 0.0 0.0 - 0.2 %    Comment: Performed at [facility], [place] [address] [place], [place] [address]  CBC     Status: Abnormal   Collection Time: 04/27/19  8:31 AM  Result Value Ref Range   WBC 5.9 4.0 - 10.5 K/uL   RBC 3.25 (L) 3.87 - 5.11 MIL/uL   Hemoglobin 9.8 (L) 12.0 - 15.0 g/dL   HCT 30.1 (L) 36.0 - 46.0 %   MCV 92.6 80.0 - 100.0 fL   MCH 30.2 26.0 - 34.0 pg   MCHC 32.6 30.0 - 36.0 g/dL   RDW 14.6 11.5 - 15.5 %    Platelets 215 150 - 400 K/uL   nRBC 0.0 0.0 - 0.2 %    Comment: Performed at [facility], [place] [address] [place], [place] [address]  Comprehensive metabolic panel     Status: Abnormal   Collection Time: 04/27/19  8:31 AM  Result Value Ref Range   Sodium 144 135 - 145 mmol/L   Potassium 3.9 3.5 - 5.1 mmol/L   Chloride 107 98 - 111 mmol/L   CO2 26 22 - 32 mmol/L   Glucose, Bld 98 70 - 99 mg/dL   BUN 12 8 - 23 mg/dL   Creatinine, Ser 0.81 0.44 - 1.00 mg/dL   Calcium 8.5 (L) 8.9 - 10.3 mg/dL   Total Protein 5.9 (L) 6.5 - 8.1 g/dL   Albumin 2.5 (L) 3.5 - 5.0 g/dL   AST 14 (L) 15 - 41 U/L   ALT 17 0 - 44 U/L   Alkaline Phosphatase 56 38 - 126 U/L   Total Bilirubin 0.8 0.3 - 1.2 mg/dL   GFR calc non Af Amer >60 >60 mL/min   GFR calc Af Amer >60 >60 mL/min   Anion gap 11 5 - 15    Comment: Performed at [facility] [address], [place], [place] [address]  Prealbumin     Status: Abnormal   Collection Time: 04/27/19  6:47 PM  Result Value Ref Range   Prealbumin 13.2 (L) 18 - 38 mg/dL    Comment: Performed at [facility] [address], [place], [place] [address]  CBC     Status: Abnormal  Collection Time: 04/28/19  3:57 AM  Result Value Ref Range   WBC 8.6 4.0 - 10.5 K/uL   RBC 3.06 (L) 3.87 - 5.11 MIL/uL   Hemoglobin 9.2 (L) 12.0 - 15.0 g/dL   HCT 28.7 (L) 36.0 - 46.0 %   MCV 93.8 80.0 - 100.0 fL   MCH 30.1 26.0 - 34.0 pg   MCHC 32.1 30.0 - 36.0 g/dL   RDW 14.7 11.5 - 15.5 %   Platelets 217 150 - 400 K/uL   nRBC 0.0 0.0 - 0.2 %    Comment: Performed at [facility], [place] [address] [place], [place] [address]  Type and screen Rafael Capo     Status: None   Collection Time: 04/28/19  3:58 AM  Result Value Ref Range   ABO/RH(D) O POS    Antibody Screen NEG    Sample Expiration      05/01/2019,2359 Performed at [facility], [place] [address], [place], [place] [address]    CT ANGIO CHEST PE W OR WO CONTRAST  Result  Date: 04/27/2019 CLINICAL DATA:  Shortness of breath. EXAM: CT ANGIOGRAPHY CHEST WITH CONTRAST TECHNIQUE: Multidetector CT imaging of the chest was performed using the standard protocol during bolus administration of intravenous contrast. Multiplanar CT image reconstructions and MIPs were obtained to evaluate the vascular anatomy. CONTRAST:  168mL OMNIPAQUE IOHEXOL 350 MG/ML SOLN COMPARISON:  04/10/2019 FINDINGS: Cardiovascular: Contrast injection is sufficient to demonstrate satisfactory opacification of the pulmonary arteries to the segmental level. There is no pulmonary embolus. The main pulmonary artery is within normal limits for size. There is no CT evidence of acute right heart strain. The visualized aorta is normal. Heart size is enlarged. Coronary artery calcifications are noted. There is no significant pericardial effusion. Mediastinum/Nodes: --No mediastinal or hilar lymphadenopathy. --No axillary lymphadenopathy. --No supraclavicular lymphadenopathy. --Normal thyroid gland. --The esophagus is unremarkable Lungs/Pleura: There are new scattered pulmonary opacities bilaterally, greatest within the lower lobes. There is no pneumothorax. There is no significant pleural effusion. The trachea is unremarkable. Upper Abdomen: No acute abnormality. Musculoskeletal: No chest wall abnormality. No acute or significant osseous findings. Review of the MIP images confirms the above findings. IMPRESSION: 1. No evidence of acute pulmonary embolus. 2. New scattered pulmonary opacities bilaterally, greatest within the lower lobes, consistent with the patient's history of COVID-19 pneumonia. Electronically Signed   By: Constance Holster M.D.   On: 04/27/2019 22:45   CT ABDOMEN PELVIS W CONTRAST  Result Date: 04/28/2019 CLINICAL DATA:  colorectal cancer, staging sigmoid mass EXAM: CT ABDOMEN AND PELVIS WITH CONTRAST TECHNIQUE: Multidetector CT imaging of the abdomen and pelvis was performed using the standard protocol  following bolus administration of intravenous contrast. CONTRAST:  153mL OMNIPAQUE IOHEXOL 350 MG/ML SOLN COMPARISON:  None. FINDINGS: Lower chest: Nodular airspace opacities at the lung bases. Hepatobiliary: No focal hepatic lesion. No biliary duct dilatation. Gallbladder is normal. Common bile duct is normal. Pancreas: Pancreas is normal. No ductal dilatation. No pancreatic inflammation. Spleen: Normal spleen Adrenals/urinary tract: Adrenal glands normal. Simple fluid attenuation cyst of the LEFT kidney. Ureters and bladder normal. Stomach/Bowel: Stomach, duodenum and small bowel normal. Normal cecum. Appendix not identified. Ascending transverse descending colon normal. In the mid sigmoid colon, there is eccentric thickening measuring 3.2 cm by 2.5 cm (image 69/3). No evidence of obstruction proximal to lesion. There is no enlarged lymph node in the sigmoid mesocolon. No presacral adenopathy or iliac adenopathy. No retroperitoneal adenopathy. Vascular/Lymphatic: Abdominal aorta is normal  caliber. No periportal or retroperitoneal adenopathy. No pelvic adenopathy. Reproductive: Post hysterectomy Other: No free fluid. Musculoskeletal: No aggressive osseous lesion. IMPRESSION: 1. Eccentric thickening in the mid sigmoid colon correlates to abnormal findings on colonoscopy. No bowel obstruction 2. No evidence of metastatic adenopathy within the sigmoid mesocolon or elsewhere in the abdomen or pelvis. 3. No liver metastasis. 4. Nodular airspace disease in the lower lobes consistent with COVID pneumonia Electronically Signed   By: Suzy Bouchard M.D.   On: 04/28/2019 05:27      Assessment/Plan CAD s/p stent 2019 HTN COVID infection was hospitalized 12/31-1/5 PE on xarelto - CTA yesterday actually with no evidence of PE  LGI bleeding  Sigmoid colon mass - s/p colonoscopy 1/16, path pending - CEA pending, CTs without evidence of metastatic disease - will need to sort out patient's situation with PE  diagnosis and pulmonary clearance for surgery - if she needs to be restarted on anticoagulation and she continues to have bloody stools and is unable to maintain stable hgb, then she will need more urgent surgical intervention - if she does not have any further bloody stools and hgb remains stable, could consider outpatient follow up for surgical intervention to be done at a later date if needed - keep on liquid diet until surgical planning more clear - monitor h/h    Brigid Re, [facility] 04/28/2019, 7:48 AM Please see Amion for pager number during day hours 7:00am-4:30pm

## 2019-04-28 NOTE — Consult Note (Signed)
NAME:  Kristina [name], MRN:  [id number], DOB:  [birth year], LOS: 2 ADMISSION DATE:  04/25/2019, CONSULTATION DATE:  04/28/19 REFERRING MD: Vernell Leep CHIEF COMPLAINT:  Sigmoid Mass  Brief History   74 yo female with recent hospitalization 12/30-1/5 for COVID PNA and segmental PE on Xerolto admitted 1/14 with weakness and diagnosed with sigmoid mass concerning for malignancy.  [facility] consulted for assistance with anticoagulation and pre-op clearance.    History of present illness   Patient hospitalized 12/30-1/5 for COVID PNA c/b acute segmental pulmonary embolism.  She completed a course of decadron and remdesivir and discharged home on Xarelto for segmental PE.   1/14 she re-presented with 2 week history of weakness and small volume bright red blood per rectum.  She was found to have hgb 10.7 and hemoccult +.  Home Xarelto held.  GI evaluated, the following day she was found to have stable hemoglobin and plan was for outpatient follow up with GI.  During this hospitalization patient did have recurrent melena and ultimately underwent EGD and Colonoscopy on 1/16 which showed acute gastritis with shallow ulceration as well as 3-4 cm mass in the sigmoid colon suspicious for malignancy.  General surgery consulted for possible resection next week once pathology finalized. Repeat imaging completed with CTA Chest, no filling defect was noted on imaging but scattered opacities were seen.   Past Medical History  CAD -NSTEMI 2018 with DES to proximal LAD and severe LCx disease Diastolic heart failure HTN HLD  Significant Hospital Events   1/14: Admit  Consults:  GI Cardiology PCCM  Procedures:  1/16: EGD: acute gastritis with shallow ulcerstaions 1/16: Colonoscopy: 3-4 cm mass in sigmoid colon, biopsy obtained.  Significant Diagnostic Tests:  12/30: CTA Chest with filling defect in right lower lobe segmental sized pulmonary artery branch 1/16: CT Abdomen/Pelvis:  mid sigmoid colon, there  is eccentric thickening measuring 3.2 cm by 2.5 cm.  No obstruction.  No evidence of metastatic adenopathy within the sigmoid mesocolon 1/16: CTA Chest: No filling defects noted, scattered pulmonary opacities  Micro Data:  12/30 COVID +  Antimicrobials:    Interim history/subjective:  New consult  Objective   Blood pressure 113/67, pulse 92, temperature 98.6 F (37 C), temperature source Oral, resp. rate 19, height 5\' 6"  (1.676 m), weight 88.1 kg, SpO2 99 %.        Intake/Output Summary (Last 24 hours) at 04/28/2019 1243 Last data filed at 04/28/2019 0900 Gross per 24 hour  Intake 1320 ml  Output --  Net 1320 ml   Filed Weights   04/25/19 2220 04/27/19 1421 04/28/19 0518  Weight: 89.1 kg 89.1 kg 88.1 kg    Examination: Defer to attending exam.   Resolved Hospital Problem list   COVID PNA 12/30 --discharged 1/5 and completed tx with Remdesivir and Decadron --Oxygenating well on room air  Assessment & Plan:  ? Segmental Pulmonary Embolism on CTA Chest 12/30 --Discharged on Xarelto 1/5, on hold due to recurrent melena --Repeat imaging on 1/16 without filling defect on CTA Chest  --Pending LE U/S to evaluate for DVT --Due to recurrent melena in setting of Sigmoid Mass, hold anticoagulation --If LE [place] +, recommend IVC filter --If LE [place] -, recommending holding surgery until 1 month post COVID + due to inflammatory state and high risk for clots.   GIB in setting of Sigmoid Mass --Continue to hold Plavix, ASA and Xarelto --Pathology pending from biopsy 1/16 --General Surgery consulted for possible Sigmoidectomy next week  Orthostatic hypotension,  on admission --Home metoprolol and Norvasc on hold  HTN --Controlled with losartan  Best practice:  Diet: Per primary Pain/Anxiety/Delirium protocol (if indicated): NA VAP protocol (if indicated): NA DVT prophylaxis: SCD GI prophylaxis: PPI Glucose control: Controlled without intervention Mobility: As tolerated Code  Status: FULL Family Communication: by primary team.  Disposition: PCCM will follow  Labs   CBC: Recent Labs  Lab 04/25/19 1100 04/25/19 1446 04/25/19 2251 04/26/19 0718 04/26/19 2204 04/27/19 0831 04/28/19 0357  WBC 7.5  --   --   --  6.1 5.9 8.6  NEUTROABS 5.5  --   --   --   --   --   --   HGB 10.7*   < > 10.3* 10.4* 10.7* 9.8* 9.2*  HCT 34.3*   < > 31.5* 32.3* 32.9* 30.1* 28.7*  MCV 96.6  --   --   --  93.2 92.6 93.8  PLT 248  --   --   --  238 215 217   < > = values in this interval not displayed.    Basic Metabolic Panel: Recent Labs  Lab 04/25/19 1100 04/27/19 0831  NA 139 144  K 3.6 3.9  CL 102 107  CO2 26 26  GLUCOSE 108* 98  BUN 19 12  CREATININE 0.95 0.81  CALCIUM 8.6* 8.5*   GFR: Estimated Creatinine Clearance: 71.2 mL/min (by C-G formula based on SCr of 0.81 mg/dL). Recent Labs  Lab 04/25/19 1100 04/26/19 2204 04/27/19 0831 04/28/19 0357  WBC 7.5 6.1 5.9 8.6    Liver Function Tests: Recent Labs  Lab 04/25/19 1100 04/27/19 0831  AST 17 14*  ALT 21 17  ALKPHOS 55 56  BILITOT 1.0 0.8  PROT 5.9* 5.9*  ALBUMIN 2.6* 2.5*   No results for input(s): LIPASE, AMYLASE in the last 168 hours. No results for input(s): AMMONIA in the last 168 hours.  ABG    Component Value Date/Time   HCO3 32.1 (H) 02/19/2007 1038   TCO2 29 01/26/2010 1053     Coagulation Profile: No results for input(s): INR, PROTIME in the last 168 hours.  Cardiac Enzymes: No results for input(s): CKTOTAL, CKMB, CKMBINDEX, TROPONINI in the last 168 hours.  HbA1C: Hgb A1c MFr Bld  Date/Time Value Ref Range Status  01/17/2010 08:35 AM (H) <5.7 % Final   6.2 (NOTE)                                                                       According to the ADA Clinical Practice Recommendations for 2011, when HbA1c is used as a screening test:   >=6.5%   Diagnostic of Diabetes Mellitus           (if abnormal result  is confirmed)  5.7-6.4%   Increased risk of developing Diabetes  [place]  References:Diagnosis and Classification of Diabetes Mellitus,Diabetes [id number] 1):S62-S69 and Standards of Medical Care in         Diabetes - 2011,Diabetes [id number]  (Suppl 1):S11-S61.    CBG: Recent Labs  Lab 04/25/19 1006  GLUCAP 98    Review of Systems:   Review of Systems  Constitutional: Negative.   HENT: Negative.   Eyes: Negative.   Gastrointestinal: Positive for blood in stool, constipation and  melena.  Skin: Negative.     Past Medical History  She,  has a past medical history of Arthritis, Carpal tunnel syndrome, Childhood asthma, Hypertension, Plantar fasciitis, bilateral, Seizures ([place]), and Wears glasses.   Surgical History    Past Surgical History:  Procedure Laterality Date  . CARDIAC CATHETERIZATION    . CARPAL TUNNEL RELEASE Left 03/10/2016   Procedure: CARPAL TUNNEL RELEASE, left;  Surgeon: Daryll Brod, MD;  Location: [place];  Service: Orthopedics;  Laterality: Left;  . COLONOSCOPY    . CORONARY STENT INTERVENTION N/A 06/23/2016   Procedure: Coronary Stent Intervention;  Surgeon: Sherren Mocha, MD;  Location: [place] [facility];  Service: Cardiovascular;  Laterality: N/A;  . DILATION AND CURETTAGE OF UTERUS    . FRACTURE SURGERY    . LEFT HEART CATH AND CORONARY ANGIOGRAPHY N/A 06/23/2016   Procedure: Left Heart Cath and Coronary Angiography;  Surgeon: Sherren Mocha, MD;  Location: [place] [facility];  Service: Cardiovascular;  Laterality: N/A;  . ORIF WRIST FRACTURE  10/11   left  . TRIGGER FINGER RELEASE Right 02/27/2013   Procedure: RELEASE TRIGGER RIGHT MIDDLE FINGER ;  Surgeon: Wynonia Sours, MD;  Location: [place];  Service: Orthopedics;  Laterality: Right;  . ULNAR NERVE TRANSPOSITION Left 03/10/2016   Procedure: ULNAR NERVE DECOMPRESSION possible TRANSPOSITION;  Surgeon: Daryll Brod, MD;  Location: [place];  Service: Orthopedics;  Laterality: Left;  [name] VAGINAL HYSTERECTOMY       "took qthing except 1 ovary"     Social History   reports that she has never smoked. She has never used smokeless tobacco. She reports current alcohol use of about 6.0 standard drinks of alcohol per week. She reports that she does not use drugs.   Family History   Her family history includes Heart attack (age of onset: 4) in her mother; Lung cancer in her brother and father; Stroke (age of onset: 38) in her mother.   Allergies Allergies  Allergen Reactions  . Tramadol Itching     Home Medications  Prior to Admission medications   Medication Sig Start Date End Date Taking? Authorizing Provider  albuterol (VENTOLIN HFA) 108 (90 Base) MCG/ACT inhaler Inhale 2 puffs into the lungs 2 (two) times daily as needed for wheezing or shortness of breath. 04/16/19  Yes Hall, Carole N, DO  amLODipine (NORVASC) 5 MG tablet Take 5 mg by mouth daily.   Yes [provider]  ascorbic acid (VITAMIN C) 500 MG tablet Take 1 tablet (500 mg total) by mouth daily. 04/17/19  Yes Kayleen Memos, DO  aspirin 81 MG chewable tablet Chew 1 tablet (81 mg total) by mouth daily. 06/25/16  Yes Dessa Phi, DO  atorvastatin (LIPITOR) 80 MG tablet Take 1 tablet (80 mg total) by mouth daily at 6 PM. 06/24/16  Yes Dessa Phi, DO  clopidogrel (PLAVIX) 75 MG tablet TAKE 1 TABLET BY MOUTH  DAILY Patient taking differently: Take 75 mg by mouth daily.  09/24/18  Yes [name], MD  furosemide (LASIX) 20 MG tablet Take 20 mg by mouth daily.  10/09/14  Yes [provider]  losartan (COZAAR) 50 MG tablet Take 50 mg by mouth daily.   Yes [provider]  metoprolol tartrate (LOPRESSOR) 25 MG tablet Take 25 mg by mouth 2 (two) times daily.   Yes [provider]  nitroGLYCERIN (NITROSTAT) 0.4 MG SL tablet Place 1 tablet (0.4 mg total) under the tongue every 5 (  five) minutes as needed for chest pain. 07/12/16 04/25/19 Yes Kilroy, Doreene Burke, PA-C  Rivaroxaban 15 & 20 MG TBPK Follow package  directions: Take one 15mg  tablet by mouth twice a day. On day 22, switch to one 20mg  tablet once a day. Take with food. 04/16/19  Yes Kayleen Memos, DO  Vitamin D3 (VITAMIN D) 25 MCG tablet Take 5 tablets (5,000 Units total) by mouth daily. 04/17/19  Yes Irene Pap N, DO  zinc sulfate 220 (50 Zn) MG capsule Take 1 capsule (220 mg total) by mouth daily. 04/17/19  Yes Kayleen Memos, DO  rivaroxaban (XARELTO) 20 MG TABS tablet Take 1 tablet (20 mg total) by mouth daily with supper. 05/04/19   Kayleen Memos, DO     Paulita Fujita, ACNP [facility]  After hours pager: [phone]

## 2019-04-28 NOTE — Progress Notes (Addendum)
PROGRESS NOTE   Kristina [name]  [id number]    DOB: Dec 31, 1945    DOA: 04/25/2019  PCP: Nolene Ebbs, MD   I have briefly reviewed patients previous medical records in [facility]  Chief Complaint:   Chief Complaint  Patient presents with  . Weakness  . Rectal Bleeding    Brief Narrative:   74 year old female, lives with her family, independent, PMH of CAD s/p stent (2018), HTN, hyperlipidemia, chronic diastolic CHF, obesity, remote history of seizures, recent hospitalization 04/10/2019-04/15/2018 for COVID-19 viral pneumonia complicated by acute segmental pulmonary embolism at which time she completed 5 days of remdesivir, has completed Decadron, was discharged on Xarelto, had been constipated for 2 to 3 days for which she called her PCP and took some medications following which she had a BM on 1/13 which she reports as dark brown color with a very small amount of blood.  On 1/15, patient had nearly been discharged and then she had a BM suggestive of melena and bright red blood.  Discharge was canceled.  GI performed EGD and colonoscopy 1/16, suspected sigmoid colon cancer, general surgery consulted and are yet to finalize timing of surgery.  Cardiology provided preop clearance.  Pulmonology consulted for recommendations regarding anticoagulation and timing of surgery.   Assessment & Plan:  Active Problems:   Near syncope   Rectal bleeding   Platelet inhibition due to Plavix   COVID-19 virus infection   GI bleeding   Melena   Malignant tumor of sigmoid colon (HCC)   Acute hemorrhagic gastritis   1. GI bleeding/sigmoid colon mass suspicious for cancer: [facility] was consulted.  Based on history and clinical evaluation, initially cause of bleeding was felt to be due to outlet bleeding.  Patient had been nearly discharged on 1/15  and then she had a BM suggestive of melena and bright red blood.  Discharge was canceled.  GI performed EGD and colonoscopy 1/16, detailed results  as below, gastritis and suspected sigmoid colon cancer.  Pathology is pending.  CEA: 3.  CT abdomen and CTA chest without metastatic disease. Plavix has been discontinued indefinitely after discussing with cardiology.  Aspirin and Xarelto currently on hold.  General surgery consulted, yet to determine final timing of surgery.  Cardiology provided preop cardiac clearance, okay to proceed.  As per pulmonology, would defer surgery for 1 month after Covid diagnosis to allow pulmonary status to fully recover and stabilize.  No further overt bleeding. 2. Near syncope: Could be related to orthostatic hypotension.  No further symptoms pertaining to this.  Telemetry shows sinus rhythm without abnormalities.  However if she continues to have persistent symptoms despite adjusting medications, recommend close outpatient follow-up with PCP/cardiology for further evaluation 3. Orthostatic hypotension: Unclear etiology.  Clinically appears euvolemic.  Vitals on admission, supine (94/58, 68/min), sitting (83/56, 76/min), standing (72/47, 80/min).  On recent admission, amlodipine and metoprolol were advised discontinued due to sinus bradycardia in the high 40s.  However it appears that patient continued to take these medications.  Currently blood pressures controlled on Lasix and losartan alone, continue.  Continue to hold metoprolol and amlodipine until close outpatient follow-up with PCP.  Repeated vitals at this time show negative orthostatic changes. 4. Recent COVID-19 pneumonia: Seems to have recovered fully from this.  No respiratory symptoms or hypoxia.  Has been quarantining at home which she states is supposed to end on 1/20.  Asymptomatic. 5. Suspected right lower lobe segmental PE by CTA chest 04/10/2019: This could be due  to COVID-19 related hypercoagulable state.  However radiologist also opines that this could be some beam hardening artifact.  Patient had been discharged on Xarelto.  Xarelto currently on hold due  to rectal bleeding from suspected sigmoid colon cancer.  Repeat CTA chest 1/16 without PE.  I personally discussed with Dr. Sabino Dick, radiology who reviewed CTA chest from 12/30 and 1/16.  He was unable to add any more details than already in the report.  Pulmonology consultation appreciated, recommend holding all anticoagulation due to colonic mass with features of active bleeding on colonoscopy, check lower extremity venous Dopplers and if has DVT then would place retrievable IVC filter until post colon surgery.  They also recommend waiting 1 month from Covid diagnosis as far as timing of surgery. 6. Hilar lymphadenopathy: Noted on same CTA chest 04/10/2019.  Follow radiologist recommendations i.e. recommend repeating chest CT in 3 months to ensure stability or resolution of these findings.  7. CAD status post PCI: No anginal symptoms.  I communicated with the patient's primary cardiologist who advised that since patient's PCI was a long time ago, okay to discontinue Plavix and continue aspirin alone while on Xarelto.  As stated above, beta-blockers held during recent admission for bradycardia and now held due to concern that her presyncope/orthostatic hypotension may be worsened by her ongoing use of polypharmacy antihypertensives.  Aspirin and Xarelto currently on hold.  May be able to resume aspirin at discharge. 8. Chronic diastolic CHF: Compensated.  Continue Lasix and losartan. 9. Essential hypertension: Controlled as noted below on Lasix and losartan alone. 10. Chronic normocytic anemia: Suspect that her baseline hemoglobin lately is in the mid 10 g range.  Despite reported GI bleed, hemoglobin remained stable.  Suspect that her anemia is multifactorial related to iron deficiency (anemia panel: Iron 52, TIBC 295, saturation ratio 18, ferritin 40,), possible chronic disease and recent acute illness.    Hemoglobin has gradually drifted down/9.2 today.  Follow CBC in a.m.   Body mass index is 31.35  kg/m./Obesity  Nutritional Status Nutrition Problem: Increased nutrient needs Etiology: acute illness, catabolic illness(recent [id number]) Signs/Symptoms: estimated needs Interventions: Ensure Enlive (each supplement provides 350kcal and 20 grams of protein)  DVT prophylaxis: SCDs Code Status: Full Family Communication: None at bedside.  Disposition: Monitor overnight to ensure stability of hemoglobin, lower extremity venous Dopplers and decision regarding IVC filter, final decision regarding timing of surgery, possible discharge home 1/18.   Consultants:   [facility] General surgery  Procedures:   Colonoscopy 1/16:  Impression:  - Preparation of the colon was fair. - Probable malignant tumor in the distal sigmoid colon. Biopsied. Tattooed. - Internal hemorrhoids. - The examination was otherwise normal where not limited by residual stool which could not be cleared.  Recommendation:  - Return patient to hospital ward for ongoing care. - Advance diet as tolerated. - Await pathology results. - Surgical consultation. - Hold Xarelto for now. - CT abd/pelvis with contrast. Repeat CT angiography of chest to re-evaluate for pulmonary embolism (seen on 04/10/2019) given the need for surgery and decisions to be made regarding anticoagulation. - Repeat colonoscopy with 2 day preparation is recommended as an outpatient 2-3 months after surgical resection of sigmoid tumor.  EGD 1/16:  Impression:  - Normal esophagus. - Acute gastritis with shallow ulceration. Biopsied. - Normal examined duodenum.  Recommendation:  -Twice daily PPI for 4 weeks, then once daily thereafter -Await pathology results.   Antimicrobials:   None  Subjective:  Denies complaints.  Has had  couple of normal colored BMs without blood.  No chest pain, dyspnea, dizziness, lightheadedness or palpitations even with activity in the room.    Objective:   Vitals:   04/27/19 1601 04/27/19 2134 04/28/19  0518 04/28/19 1500  BP: 129/83 104/79 113/67 115/70  Pulse: 78 (!) 102 92 80  Resp:  20 19 18   Temp:  99.1 F (37.3 C) 98.6 F (37 C) 98.4 F (36.9 C)  TempSrc:  Oral Oral Oral  SpO2:  97% 99% 98%  Weight:   88.1 kg   Height:         General: Pleasant elderly female, moderately built and obese lying comfortably propped up in bed without distress. Cardiovascular:  S1 and S2 heard, RRR.  No JVD, murmurs or pedal edema. Respiratory: Clear to auscultation without wheezing, rhonchi or crackles. No increased work of breathing. Abdominal:  Non distended, non tender & soft. No organomegaly or masses appreciated. Normal bowel sounds heard. CNS: Alert and oriented. No focal deficits. Extremities: no edema, no cyanosis    Data Reviewed:   I have personally reviewed following labs and imaging studies   CBC: Recent Labs  Lab 04/25/19 1100 04/25/19 1446 04/26/19 2204 04/27/19 0831 04/28/19 0357  WBC 7.5   < > 6.1 5.9 8.6  NEUTROABS 5.5  --   --   --   --   HGB 10.7*   < > 10.7* 9.8* 9.2*  HCT 34.3*   < > 32.9* 30.1* 28.7*  MCV 96.6   < > 93.2 92.6 93.8  PLT 248   < > 238 215 217   < > = values in this interval not displayed.    Basic Metabolic Panel: Recent Labs  Lab 04/25/19 1100 04/27/19 0831  NA 139 144  K 3.6 3.9  CL 102 107  CO2 26 26  GLUCOSE 108* 98  BUN 19 12  CREATININE 0.95 0.81  CALCIUM 8.6* 8.5*    Liver Function Tests: Recent Labs  Lab 04/25/19 1100 04/27/19 0831  AST 17 14*  ALT 21 17  ALKPHOS 55 56  BILITOT 1.0 0.8  PROT 5.9* 5.9*  ALBUMIN 2.6* 2.5*    CBG: Recent Labs  Lab 04/25/19 1006  GLUCAP 98    Microbiology Studies:  No results found for this or any previous visit (from the past 240 hour(s)).   Radiology Studies:  CT ANGIO CHEST PE W OR WO CONTRAST  Result Date: 04/27/2019 CLINICAL DATA:  Shortness of breath. EXAM: CT ANGIOGRAPHY CHEST WITH CONTRAST TECHNIQUE: Multidetector CT imaging of the chest was performed using the  standard protocol during bolus administration of intravenous contrast. Multiplanar CT image reconstructions and MIPs were obtained to evaluate the vascular anatomy. CONTRAST:  128mL OMNIPAQUE IOHEXOL 350 MG/ML SOLN COMPARISON:  04/10/2019 FINDINGS: Cardiovascular: Contrast injection is sufficient to demonstrate satisfactory opacification of the pulmonary arteries to the segmental level. There is no pulmonary embolus. The main pulmonary artery is within normal limits for size. There is no CT evidence of acute right heart strain. The visualized aorta is normal. Heart size is enlarged. Coronary artery calcifications are noted. There is no significant pericardial effusion. Mediastinum/Nodes: --No mediastinal or hilar lymphadenopathy. --No axillary lymphadenopathy. --No supraclavicular lymphadenopathy. --Normal thyroid gland. --The esophagus is unremarkable Lungs/Pleura: There are new scattered pulmonary opacities bilaterally, greatest within the lower lobes. There is no pneumothorax. There is no significant pleural effusion. The trachea is unremarkable. Upper Abdomen: No acute abnormality. Musculoskeletal: No chest wall abnormality. No acute or significant osseous findings.  Review of the MIP images confirms the above findings. IMPRESSION: 1. No evidence of acute pulmonary embolus. 2. New scattered pulmonary opacities bilaterally, greatest within the lower lobes, consistent with the patient's history of COVID-19 pneumonia. Electronically Signed   By: Constance Holster M.D.   On: 04/27/2019 22:45   CT ABDOMEN PELVIS W CONTRAST  Result Date: 04/28/2019 CLINICAL DATA:  colorectal cancer, staging sigmoid mass EXAM: CT ABDOMEN AND PELVIS WITH CONTRAST TECHNIQUE: Multidetector CT imaging of the abdomen and pelvis was performed using the standard protocol following bolus administration of intravenous contrast. CONTRAST:  169mL OMNIPAQUE IOHEXOL 350 MG/ML SOLN COMPARISON:  None. FINDINGS: Lower chest: Nodular airspace  opacities at the lung bases. Hepatobiliary: No focal hepatic lesion. No biliary duct dilatation. Gallbladder is normal. Common bile duct is normal. Pancreas: Pancreas is normal. No ductal dilatation. No pancreatic inflammation. Spleen: Normal spleen Adrenals/urinary tract: Adrenal glands normal. Simple fluid attenuation cyst of the LEFT kidney. Ureters and bladder normal. Stomach/Bowel: Stomach, duodenum and small bowel normal. Normal cecum. Appendix not identified. Ascending transverse descending colon normal. In the mid sigmoid colon, there is eccentric thickening measuring 3.2 cm by 2.5 cm (image 69/3). No evidence of obstruction proximal to lesion. There is no enlarged lymph node in the sigmoid mesocolon. No presacral adenopathy or iliac adenopathy. No retroperitoneal adenopathy. Vascular/Lymphatic: Abdominal aorta is normal caliber. No periportal or retroperitoneal adenopathy. No pelvic adenopathy. Reproductive: Post hysterectomy Other: No free fluid. Musculoskeletal: No aggressive osseous lesion. IMPRESSION: 1. Eccentric thickening in the mid sigmoid colon correlates to abnormal findings on colonoscopy. No bowel obstruction 2. No evidence of metastatic adenopathy within the sigmoid mesocolon or elsewhere in the abdomen or pelvis. 3. No liver metastasis. 4. Nodular airspace disease in the lower lobes consistent with COVID pneumonia Electronically Signed   By: Suzy Bouchard M.D.   On: 04/28/2019 05:27     Scheduled Meds:   . atorvastatin  80 mg Oral q1800  . cholecalciferol  5,000 Units Oral Daily  . feeding supplement (ENSURE ENLIVE)  237 mL Oral TID BM  . furosemide  20 mg Oral Daily  . losartan  50 mg Oral Daily  . pantoprazole  40 mg Oral BID    Continuous Infusions:     LOS: 2 days     Vernell Leep, MD, [place], [facility]. [facility]    To contact the attending provider between 7A-7P or the covering provider during after hours 7P-7A, please log into the web site [email or web address]  and access using universal [place] password for that web site. If you do not have the password, please call the hospital operator.  04/28/2019, 4:38 PM

## 2019-04-28 NOTE — Plan of Care (Signed)
  Problem: Education: Goal: Ability to identify signs and symptoms of gastrointestinal bleeding will improve 04/28/2019 1014 by Lonia Mad, RN Outcome: Progressing 04/28/2019 1014 by Lonia Mad, RN Outcome: Progressing   Problem: Bowel/Gastric: Goal: Will show no signs and symptoms of gastrointestinal bleeding 04/28/2019 1014 by Lonia Mad, RN Outcome: Progressing 04/28/2019 1014 by Lonia Mad, RN Outcome: Progressing   Problem: Fluid Volume: Goal: Will show no signs and symptoms of excessive bleeding 04/28/2019 1014 by Lonia Mad, RN Outcome: Progressing 04/28/2019 1014 by Lonia Mad, RN Outcome: Progressing   Problem: Clinical Measurements: Goal: Complications related to the disease process, condition or treatment will be avoided or minimized 04/28/2019 1014 by Lonia Mad, RN Outcome: Progressing 04/28/2019 1014 by Lonia Mad, RN Outcome: Progressing

## 2019-04-29 ENCOUNTER — Inpatient Hospital Stay (HOSPITAL_COMMUNITY): Payer: 59

## 2019-04-29 DIAGNOSIS — I2699 Other pulmonary embolism without acute cor pulmonale: Secondary | ICD-10-CM

## 2019-04-29 DIAGNOSIS — Z01818 Encounter for other preprocedural examination: Secondary | ICD-10-CM

## 2019-04-29 DIAGNOSIS — U071 COVID-19: Secondary | ICD-10-CM

## 2019-04-29 LAB — CBC
HCT: 28.2 % — ABNORMAL LOW (ref 36.0–46.0)
Hemoglobin: 9.1 g/dL — ABNORMAL LOW (ref 12.0–15.0)
MCH: 30.3 pg (ref 26.0–34.0)
MCHC: 32.3 g/dL (ref 30.0–36.0)
MCV: 94 fL (ref 80.0–100.0)
Platelets: 214 10*3/uL (ref 150–400)
RBC: 3 MIL/uL — ABNORMAL LOW (ref 3.87–5.11)
RDW: 14.6 % (ref 11.5–15.5)
WBC: 5.5 10*3/uL (ref 4.0–10.5)
nRBC: 0 % (ref 0.0–0.2)

## 2019-04-29 MED ORDER — ENSURE ENLIVE PO LIQD
237.0000 mL | Freq: Two times a day (BID) | ORAL | Status: DC
  Administered 2019-04-29: 237 mL via ORAL

## 2019-04-29 MED ORDER — PANTOPRAZOLE SODIUM 40 MG PO TBEC: 40.0000 mg | DELAYED_RELEASE_TABLET | Freq: Two times a day (BID) | ORAL | 0 refills | Status: DC
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
## 2019-04-29 NOTE — Progress Notes (Signed)
Patient was discharged home by MD order; discharged instructions review and given to patient with care notes ; IV DIC; skin intact; patient will be escorted to the car by nurse tech via wheelchair.  

## 2019-04-29 NOTE — Progress Notes (Signed)
Nutrition Follow-up  RD working remotely.  DOCUMENTATION CODES:   Obesity unspecified  INTERVENTION:   - Ensure Enlive po BID, each supplement provides 350 kcal and 20 grams of protein  - Encourage adequate PO intake  NUTRITION DIAGNOSIS:   Increased nutrient needs related to acute illness, catabolic illness (recent [id number]) as evidenced by estimated needs.  Ongoing, being addressed via supplements  GOAL:   Patient will meet greater than or equal to 90% of their needs  Progressing  MONITOR:   PO intake, Supplement acceptance, Labs, Weight trends  REASON FOR ASSESSMENT:   Consult Assessment of nutrition requirement/status  ASSESSMENT:   74 year old female who presented to the ED on 1/14 with increased weakness and rectal bleeding. Pt diagnosed with COVID-19 on 04/10/19. PMH of seizure, CAD s/p stents, HTN, recent hospitalization for COVID-19.  01/15 - clear liquid diet 01/16 - s/p colonoscopy and EGD with suspicion for sigmoid colon cancer, full liquid diet 01/17 - soft diet  Pt will require surgical intervention. However, pulmonary recommending waiting one month from initial COVID-19 diagnosis before surgery.  Spoke with pt via phone call to room. Pt reports that even with her procedures, she has not had a change in appetite. Pt reports she continues to eat well at meals and have a good appetite.  Pt reports that she has not yet tried an [employer] even though it is ordered. MAR states pt has been refusing Ensure Enlive, but pt denies ever having received one. Pt states she is eating very well though.  Weight stable.  Meal Completion: 0-100% x last 4 recorded meals  Medications reviewed and include: Ensure Enlive TID, cholecalciferol, Lasix, protonix  Labs reviewed: hemoglobin 9.1  Diet Order:   Diet Order            DIET SOFT Room service appropriate? Yes; Fluid consistency: Thin  Diet effective now        Diet - low sodium heart healthy               EDUCATION NEEDS:   Education needs have been addressed  Skin:  Skin Assessment: Reviewed RN Assessment  Last BM:  04/28/19 small type 4  Height:   Ht Readings from Last 1 Encounters:  04/27/19 5\' 6"  (1.676 m)    Weight:   Wt Readings from Last 1 Encounters:  04/28/19 88.1 kg    Ideal Body Weight:  59.1 kg  BMI:  Body mass index is 31.35 kg/m.  Estimated Nutritional Needs:   Kcal:  1850-2050  Protein:  90-110 grams  Fluid:  1.8-2.0 L    Gaynell Face, MS, RD, LDN Inpatient Clinical Dietitian Pager: [phone] Weekend/After Hours: [phone]

## 2019-04-29 NOTE — Anesthesia Postprocedure Evaluation (Signed)
Anesthesia Post Note  Patient: Kristina [name]  Procedure(s) Performed: ESOPHAGOGASTRODUODENOSCOPY (EGD) WITH PROPOFOL (N/A ) COLONOSCOPY WITH PROPOFOL (N/A ) BIOPSY SUBMUCOSAL TATTOO INJECTION     Patient location during evaluation: PACU Anesthesia Type: MAC Level of consciousness: awake and alert Pain management: pain level controlled Vital Signs Assessment: post-procedure vital signs reviewed and stable Respiratory status: spontaneous breathing, nonlabored ventilation, respiratory function stable and patient connected to nasal cannula oxygen Cardiovascular status: stable and blood pressure returned to baseline Postop Assessment: no apparent nausea or vomiting Anesthetic complications: no    Last Vitals:  Vitals:   04/29/19 0425 04/29/19 1451  BP: 122/74 128/72  Pulse: 71 72  Resp: 19 16  Temp: 36.6 C 36.7 C  SpO2: 99% 100%    Last Pain:  Vitals:   04/29/19 1451  TempSrc: Oral  PainSc:                  Tiajuana Amass

## 2019-04-29 NOTE — Progress Notes (Signed)
Progress Note  I interviewed and examined the patient today.  She is being discharged and I have updated her discharge summary with updated medications, history and physical exam, consultants recommendations etc.  I personally called patient's son and updated care and also with RN.  Vernell Leep, MD, [place], [facility]. [facility]  To contact the attending provider between 7A-7P or the covering provider during after hours 7P-7A, please log into the web site [email or web address] and access using universal [place] password for that web site. If you do not have the password, please call the hospital operator.

## 2019-04-29 NOTE — Progress Notes (Addendum)
2 Days Post-Op  Subjective: CC: Doing well. She was adv to soft diet yesterday and toleratign without n/v. She reports int crampy pain in LLQ. None currently. Passing flatus. She had a small non-bloody, non-melanous BM this AM.   Objective: Vital signs in last 24 hours: Temp:  [97.9 F (36.6 C)-98.4 F (36.9 C)] 97.9 F (36.6 C) (01/18 0425) Pulse Rate:  [71-95] 71 (01/18 0425) Resp:  [18-20] 19 (01/18 0425) BP: (115-126)/(64-74) 122/74 (01/18 0425) SpO2:  [98 %-99 %] 99 % (01/18 0425) Last BM Date: 04/28/19  Intake/Output from previous day: 01/17 0701 - 01/18 0700 In: 360 [P.O.:360] Out: -  Intake/Output this shift: No intake/output data recorded.  PE: Gen:  Alert, NAD, pleasant Pulm: normal rate and effort  Abd: Soft, NT/ND, +BS Ext:  No Le edema Psych: A&Ox3  Skin: no rashes noted, warm and dry  Lab Results:  Recent Labs    04/28/19 0357 04/29/19 0654  WBC 8.6 5.5  HGB 9.2* 9.1*  HCT 28.7* 28.2*  PLT 217 214   BMET Recent Labs    04/27/19 0831  NA 144  K 3.9  CL 107  CO2 26  GLUCOSE 98  BUN 12  CREATININE 0.81  CALCIUM 8.5*   PT/INR No results for input(s): LABPROT, INR in the last 72 hours. CMP     Component Value Date/Time   NA 144 04/27/2019 0831   K 3.9 04/27/2019 0831   CL 107 04/27/2019 0831   CO2 26 04/27/2019 0831   GLUCOSE 98 04/27/2019 0831   BUN 12 04/27/2019 0831   CREATININE 0.81 04/27/2019 0831   CALCIUM 8.5 (L) 04/27/2019 0831   PROT 5.9 (L) 04/27/2019 0831   PROT 6.9 12/26/2018 0825   ALBUMIN 2.5 (L) 04/27/2019 0831   ALBUMIN 4.4 12/26/2018 0825   AST 14 (L) 04/27/2019 0831   ALT 17 04/27/2019 0831   ALKPHOS 56 04/27/2019 0831   BILITOT 0.8 04/27/2019 0831   BILITOT 0.3 12/26/2018 0825   GFRNONAA >60 04/27/2019 0831   GFRAA >60 04/27/2019 0831   Lipase     Component Value Date/Time   LIPASE 29 03/26/2011 0833       Studies/Results: CT ANGIO CHEST PE W OR WO CONTRAST  Result Date: 04/27/2019 CLINICAL  DATA:  Shortness of breath. EXAM: CT ANGIOGRAPHY CHEST WITH CONTRAST TECHNIQUE: Multidetector CT imaging of the chest was performed using the standard protocol during bolus administration of intravenous contrast. Multiplanar CT image reconstructions and MIPs were obtained to evaluate the vascular anatomy. CONTRAST:  179mL OMNIPAQUE IOHEXOL 350 MG/ML SOLN COMPARISON:  04/10/2019 FINDINGS: Cardiovascular: Contrast injection is sufficient to demonstrate satisfactory opacification of the pulmonary arteries to the segmental level. There is no pulmonary embolus. The main pulmonary artery is within normal limits for size. There is no CT evidence of acute right heart strain. The visualized aorta is normal. Heart size is enlarged. Coronary artery calcifications are noted. There is no significant pericardial effusion. Mediastinum/Nodes: --No mediastinal or hilar lymphadenopathy. --No axillary lymphadenopathy. --No supraclavicular lymphadenopathy. --Normal thyroid gland. --The esophagus is unremarkable Lungs/Pleura: There are new scattered pulmonary opacities bilaterally, greatest within the lower lobes. There is no pneumothorax. There is no significant pleural effusion. The trachea is unremarkable. Upper Abdomen: No acute abnormality. Musculoskeletal: No chest wall abnormality. No acute or significant osseous findings. Review of the MIP images confirms the above findings. IMPRESSION: 1. No evidence of acute pulmonary embolus. 2. New scattered pulmonary opacities bilaterally, greatest within the lower lobes, consistent with  the patient's history of COVID-19 pneumonia. Electronically Signed   By: Constance Holster M.D.   On: 04/27/2019 22:45   CT ABDOMEN PELVIS W CONTRAST  Result Date: 04/28/2019 CLINICAL DATA:  colorectal cancer, staging sigmoid mass EXAM: CT ABDOMEN AND PELVIS WITH CONTRAST TECHNIQUE: Multidetector CT imaging of the abdomen and pelvis was performed using the standard protocol following bolus  administration of intravenous contrast. CONTRAST:  193mL OMNIPAQUE IOHEXOL 350 MG/ML SOLN COMPARISON:  None. FINDINGS: Lower chest: Nodular airspace opacities at the lung bases. Hepatobiliary: No focal hepatic lesion. No biliary duct dilatation. Gallbladder is normal. Common bile duct is normal. Pancreas: Pancreas is normal. No ductal dilatation. No pancreatic inflammation. Spleen: Normal spleen Adrenals/urinary tract: Adrenal glands normal. Simple fluid attenuation cyst of the LEFT kidney. Ureters and bladder normal. Stomach/Bowel: Stomach, duodenum and small bowel normal. Normal cecum. Appendix not identified. Ascending transverse descending colon normal. In the mid sigmoid colon, there is eccentric thickening measuring 3.2 cm by 2.5 cm (image 69/3). No evidence of obstruction proximal to lesion. There is no enlarged lymph node in the sigmoid mesocolon. No presacral adenopathy or iliac adenopathy. No retroperitoneal adenopathy. Vascular/Lymphatic: Abdominal aorta is normal caliber. No periportal or retroperitoneal adenopathy. No pelvic adenopathy. Reproductive: Post hysterectomy Other: No free fluid. Musculoskeletal: No aggressive osseous lesion. IMPRESSION: 1. Eccentric thickening in the mid sigmoid colon correlates to abnormal findings on colonoscopy. No bowel obstruction 2. No evidence of metastatic adenopathy within the sigmoid mesocolon or elsewhere in the abdomen or pelvis. 3. No liver metastasis. 4. Nodular airspace disease in the lower lobes consistent with COVID pneumonia Electronically Signed   By: Suzy Bouchard M.D.   On: 04/28/2019 05:27    Anti-infectives: Anti-infectives (From admission, onward)   None       Assessment/Plan CAD s/p stent 2019 - cardiology cleared 1/17. Classifies as intermediate risk. Cards would like ASA started as soon as able from LGI bleed standpoint. Does not need plavix moving forward HTN COVID + (hospitalized 12/31-1/5) - Initial dx 12/30. Pulm recs waiting one  month from initial dx before surgery  Right hilar lymph node seen on CTA - pulm recs non contrast CT in 3 months  PE on Xarelto - CTA yesterday actually with no evidence of PE. Xarelto on hold for LGI bleeding  LGI bleeding  Sigmoid colon mass ABL Anemia  - S/p colonoscopy 1/16 that showed a frond-like/villous and fungating non-obstructing mass in the distal sigmoid colon that is non-circumferential measuring ~3-4 cm in size and located 25 cm from the dentate line.  - Path pending - CEA 3 - CTs without evidence of metastatic disease - Pulm recs to check LE Dopplers. If there is a clot they would rec placing a retrievable IVC filter until after surgery - Pulm recs waiting one month from initial covid dx (12/30 before surgery) - No blood in BM today and hgb stable. Could consider outpatient follow up for surgical intervention to be done at a later date if needed - monitor h/h - If she begins to have bloody stools again and is unable to maintain stable hgb, then she will need more urgent surgical intervention  FEN: On a soft diet  VTE: SCDs ID: None  Addendum: Discussed with MD. Would favor taking care of this as an outpatient. She is tolerating a diet without n/v and having bowel function. The mass is not obstructing. Her hgb has stabilized and her BM this am was without blood. Will arrange for her to follow up with one  of our colorectal surgeons.    LOS: 3 days    Jillyn Ledger , [facility] 04/29/2019, 9:31 AM Please see Amion for pager number during day hours 7:00am-4:30pm

## 2019-04-29 NOTE — Discharge Summary (Addendum)
Physician Discharge Summary  Kristina [name] [id number] DOB: [birth year]  PCP: Nolene Ebbs, MD  Admitted from: Home Discharged to: Home  Admit date: 04/25/2019 Discharge date: 04/29/2019  Recommendations for Outpatient Follow-up:   Follow-up Information    Nolene Ebbs, MD. Schedule an appointment as soon as possible for a visit in 1 week(s).   Specialty: Internal Medicine Why: To be seen with repeat labs (CBC & CMP). Contact information: [place] [address] [phone]        [name], MD. Schedule an appointment as soon as possible for a visit.   Specialties: Cardiology, Radiology Contact information: [address] [place] [address] [phone]        [name], MD Follow up.   Specialty: Gastroenterology Why: MDs office will arrange office visit. Contact information: [address] [place] [address] [phone]        Ileana Roup, MD. Schedule an appointment as soon as possible for a visit.   Specialty: General Surgery Why: Please call the office to schedule a follow up appointment  Contact information: [place] [address] [phone]            Home Health: None Equipment/Devices: None  Discharge Condition: Improved and stable CODE STATUS: Full Diet recommendation: Heart healthy diet.  Discharge Diagnoses:  Active Problems:   Near syncope   Rectal bleeding   Platelet inhibition due to Plavix   COVID-19 virus infection   GI bleeding   Melena   Malignant tumor of sigmoid colon (HCC)   Acute hemorrhagic gastritis   Brief Summary:  74 year old female, lives with her family, independent, PMH of CAD s/p stent (2018), HTN, hyperlipidemia, chronic diastolic CHF, obesity, remote history of seizures, recent hospitalization 04/10/2019-04/15/2018 forCOVID-19 viral pneumonia complicated by acute segmental pulmonary embolism at which time she completed 5  days of remdesivir, has completed Decadron, was discharged on Xarelto, had been constipated for 2 to 3 days for which she called her PCP and took some medications following which she had a BM on 1/13 which she reports as dark brown color with a very small amount of blood.  On 1/15, patient had nearly been discharged and then she had a BM suggestive of melena and bright red blood.  Discharge was canceled.  GI performed EGD and colonoscopy 1/16, suspected sigmoid colon cancer, pathology is pending.  General surgery consulted and plan outpatient follow-up to determine appropriate timing of surgery.  Cardiology provided preop clearance.  Pulmonology consulted for preop clearance and advise regarding anticoagulation issues.  Assessment and plan:   1. GI bleeding/sigmoid colon mass suspicious for cancer:  GI was consulted.  Based on history and clinical evaluation, initially cause of bleeding was felt to be due to outlet bleeding.  Patient had been nearly discharged on 1/15  and then she had a BM suggestive of melena and bright red blood.  Discharge was canceled.  GI performed EGD and colonoscopy 1/16, detailed results as below, gastritis and suspected sigmoid colon cancer.  Pathology is pending.  CEA: 3.  CT abdomen and CTA chest without metastatic disease. Plavix discontinued indefinitely after discussing with Cardiology. Xarelto discontinued as per Pulmonology recommendation, due to sigmoid mass with bleeding, only a small segmental PE seen by CTA chest on 12/30 and repeat CTA chest this admission without PE and high risk for significant bleeding if this were to be continued. General surgery consulted and will follow outpatient to determine appropriate timing of  surgery.  Cardiology consulted for preop clearance and indicate that patient is at intermediate risk for intermediate risk procedure and do not recommend any further work-up prior to surgery.  Of note beta-blockers were discontinued due to concern  for bradycardia and hypotension, PCP consider resuming at lower dose as outpatient which may be helpful perioperatively.  As per pulmonology consultation, hold off all anticoagulation due to colonic mass bleeding, checked lower extremity venous Dopplers which were negative for DVT (plan would have been to place a retrievable IVC filter if she had lower extremity DVT) and to wait about a month from the Covid diagnosis to allow for the inflammatory milieu to settle down prior to proceeding with surgery otherwise she will be a very high risk for VTE postoperatively.  I discussed with GI, okay to resume aspirin 81 mg daily and cleared for discharge home. 2. Near syncope: Could be related to orthostatic hypotension. No further symptoms pertaining to this. Telemetry shows sinus rhythm without abnormalities. However if she continues to have persistent symptoms despite adjusting medications, recommend close outpatient follow-up with PCP/cardiology for further evaluation.  Advised avoiding driving for 6 months or until cleared by her physician during office visit. 3. Orthostatic hypotension: Unclear etiology. Clinically appears euvolemic. Vitals on admission, supine (94/58, 68/min), sitting (83/56, 76/min), standing (72/47, 80/min). On recent admission, amlodipine and metoprolol were advised discontinued due to sinus bradycardia in the high 40s. However it appears that patient continuedto take these medications. Currently blood pressures controlled on Lasix and losartan alone, continue. Continue to hold metoprolol and amlodipine until close outpatient follow-up with PCP.Repeated vitals at this time show negative orthostatic changes. 4. Recent COVID-19 pneumonia:Seems to have recovered fully from this. No respiratory symptoms or hypoxia. Has been quarantining at home which she states is supposed to end on 1/20. Asymptomatic. 5. Suspected right lower lobe segmental PE by CTA chest 04/10/2019:This could be  due to COVID-19 related hypercoagulable state. However radiologist also opines that this could be some beam hardening artifact. Patient had been discharged on Xarelto.  Xarelto discontinued due to rectal bleeding from suspected sigmoid colon cancer.  Repeat CTA chest 1/16 without PE.  I personally discussed with Radiology on 1/17 who reviewed CTA chest from 12/30 and 1/16 and radiologist was unable to add any more details than already in the report.  Pulmonology consultation appreciated, recommend holding all anticoagulation due to colonic mass with features of active bleeding on colonoscopy, check lower extremity venous Dopplers and if has DVT then would place retrievable IVC filter until post colon surgery. LEV Dopplers negative for DVT They also recommend waiting 1 month from Covid diagnosis as far as timing of surgery. 6. Hilar lymphadenopathy:Noted on same CTA chest 04/10/2019. Follow radiologist recommendations i.e. recommend repeating chest CT in 3 months to ensure stability or resolution of these findings.  7. CAD status post PCI: No anginal symptoms. I communicated with the patient's primary cardiologist who advised that since patient's PCI was a long time ago, okay to discontinue Plavix and continue aspirin alone while on Xarelto. As stated above, beta-blockers held during recent admission for bradycardia and now held due to concern that her presyncope/orthostatic hypotension may be worsened by her ongoing use of polypharmacy antihypertensives.    Aspirin resumed at discharge. 8. Chronic diastolic IU:[id number]. Continue Lasix and losartan. 9. Essential hypertension:Controlled as noted below onLasix and losartan alone. 10. Chronic normocytic anemia: Suspect that her baseline hemoglobin lately is in the mid 10 g range. Despite reported GI bleed, hemoglobin remained stable.  Suspect that her anemia is multifactorial related to iron deficiency likely due to slow occult GI blood loss none  from unknown sigmoid colon mass (anemia panel: Iron 52, TIBC 295, saturation ratio 18, ferritin 40,), possible chronic disease and recent acute illness.   Hemoglobin has stabilized in the low 9 g range.   Body mass index is 31.35 kg/m./Obesity  Nutritional Status Nutrition Problem: Increased nutrient needs Etiology: acute illness, catabolic illness(recent [id number]) Signs/Symptoms: estimated needs Interventions: Ensure Enlive (each supplement provides 350kcal and 20 grams of protein)  Consultations:   GI  General surgery  Cardiology  Pulmonology  Procedures: Colonoscopy 1/16:  Impression:  - Preparation of the colon was fair. - Probable malignant tumor in the distal sigmoid colon. Biopsied. Tattooed. - Internal hemorrhoids. - The examination was otherwise normal where not limited by residual stool which could not be cleared.  Recommendation:  - Return patient to hospital ward for ongoing care. - Advance diet as tolerated. - Await pathology results. - Surgical consultation. - Hold Xarelto for now. - CT abd/pelvis with contrast. Repeat CT angiography of chest to re-evaluate for pulmonary embolism (seen on 04/10/2019) given the need for surgery and decisions to be made regarding anticoagulation. - Repeat colonoscopy with 2 day preparation is recommended as an outpatient 2-3 months after surgical resection of sigmoid tumor.  EGD 1/16:  Impression:  - Normal esophagus. - Acute gastritis with shallow ulceration. Biopsied. - Normal examined duodenum.  Recommendation:  -Twice daily PPI for 4 weeks, then once daily thereafter -Await pathology results.  Bilateral lower extremity venous Dopplers 04/29/2019:  Summary: Right: There is no evidence of deep vein thrombosis in the lower extremity. Left: There is no evidence of deep vein thrombosis in the lower extremity.     Discharge Instructions  Discharge Instructions    (HEART FAILURE  PATIENTS) Call MD:  Anytime you have any of the following symptoms: 1) 3 pound weight gain in 24 hours or 5 pounds in 1 week 2) shortness of breath, with or without a dry hacking cough 3) swelling in the hands, feet or stomach 4) if you have to sleep on extra pillows at night in order to breathe.   Complete by: As directed    Call MD for:   Complete by: As directed    Recurrent rectal bleeding or passing black tarry stools.   Call MD for:  difficulty breathing, headache or visual disturbances   Complete by: As directed    Call MD for:  extreme fatigue   Complete by: As directed    Call MD for:  persistant dizziness or light-headedness   Complete by: As directed    Call MD for:  persistant nausea and vomiting   Complete by: As directed    Call MD for:  severe uncontrolled pain   Complete by: As directed    Call MD for:  temperature >100.4   Complete by: As directed    Diet - low sodium heart healthy   Complete by: As directed    Driving Restrictions   Complete by: As directed    No driving for 6 months or until cleared by your physician during office visit.   Increase activity slowly   Complete by: As directed        Medication List    STOP taking these medications   amLODipine 5 MG tablet Commonly known as: NORVASC   clopidogrel 75 MG tablet Commonly known as: PLAVIX   metoprolol tartrate 25 MG tablet Commonly known as:  LOPRESSOR   Rivaroxaban 15 & 20 MG Tbpk   rivaroxaban 20 MG Tabs tablet Commonly known as: XARELTO     TAKE these medications   albuterol 108 (90 Base) MCG/ACT inhaler Commonly known as: VENTOLIN HFA Inhale 2 puffs into the lungs 2 (two) times daily as needed for wheezing or shortness of breath.   ascorbic acid 500 MG tablet Commonly known as: VITAMIN C Take 1 tablet (500 mg total) by mouth daily.   aspirin 81 MG chewable tablet Chew 1 tablet (81 mg total) by mouth daily.   atorvastatin 80 MG tablet Commonly known as: LIPITOR Take 1 tablet  (80 mg total) by mouth daily at 6 PM.   furosemide 20 MG tablet Commonly known as: LASIX Take 20 mg by mouth daily.   losartan 50 MG tablet Commonly known as: COZAAR Take 50 mg by mouth daily.   nitroGLYCERIN 0.4 MG SL tablet Commonly known as: NITROSTAT Place 1 tablet (0.4 mg total) under the tongue every 5 (five) minutes as needed for chest pain.   pantoprazole 40 MG tablet Commonly known as: PROTONIX Take 1 tablet (40 mg total) by mouth 2 (two) times daily.   Vitamin D3 25 MCG tablet Commonly known as: Vitamin D Take 5 tablets (5,000 Units total) by mouth daily.   zinc sulfate 220 (50 Zn) MG capsule Take 1 capsule (220 mg total) by mouth daily.      Allergies  Allergen Reactions  . Tramadol Itching    Subjective: Patient denies complaints.  Had a small normal colored BM without blood or black tarry stools.  Tolerating diet without nausea, vomiting or abdominal pain.  Ambulating comfortably without dizziness, lightheadedness, palpitations, chest pain or dyspnea.  Discharge Exam:  Vitals:   04/28/19 1500 04/28/19 2010 04/29/19 0425 04/29/19 1451  BP: 115/70 126/64 122/74 128/72  Pulse: 80 95 71 72  Resp: 18 20 19 16   Temp: 98.4 F (36.9 C) 98 F (36.7 C) 97.9 F (36.6 C) 98.1 F (36.7 C)  TempSrc: Oral Oral Oral Oral  SpO2: 98% 98% 99% 100%  Weight:      Height:        General:Pleasant elderly female, moderately built and obese lying comfortably propped up in bed without distress. Cardiovascular: S1 and S2 heard, RRR.  No JVD, murmurs or pedal edema. Respiratory:Clear to auscultation without wheezing, rhonchi or crackles. No increased work of breathing. Abdominal: Non distended, non tender &soft. No organomegaly or masses appreciated. Normal bowel sounds heard. CNS: Alert and oriented. No focal deficits. Extremities:no edema, no cyanosis    The results of significant diagnostics from this hospitalization (including imaging, microbiology, ancillary  and laboratory) are listed below for reference.     Microbiology: No results found for this or any previous visit (from the past 240 hour(s)).   Labs: CBC: Recent Labs  Lab 04/25/19 1100 04/25/19 1446 04/26/19 0718 04/26/19 2204 04/27/19 0831 04/28/19 0357 04/29/19 0654  WBC 7.5  --   --  6.1 5.9 8.6 5.5  NEUTROABS 5.5  --   --   --   --   --   --   HGB 10.7*   < > 10.4* 10.7* 9.8* 9.2* 9.1*  HCT 34.3*   < > 32.3* 32.9* 30.1* 28.7* 28.2*  MCV 96.6  --   --  93.2 92.6 93.8 94.0  PLT 248  --   --  238 215 217 214   < > = values in this interval not displayed.  Basic Metabolic Panel: Recent Labs  Lab 04/25/19 1100 04/27/19 0831  NA 139 144  K 3.6 3.9  CL 102 107  CO2 26 26  GLUCOSE 108* 98  BUN 19 12  CREATININE 0.95 0.81  CALCIUM 8.6* 8.5*    Liver Function Tests: Recent Labs  Lab 04/25/19 1100 04/27/19 0831  AST 17 14*  ALT 21 17  ALKPHOS 55 56  BILITOT 1.0 0.8  PROT 5.9* 5.9*  ALBUMIN 2.6* 2.5*    CBG: Recent Labs  Lab 04/25/19 1006  GLUCAP 98    I discussed in detail with patient's son via phone, updated care and answered all questions.  Time coordinating discharge: 25 minutes  SIGNED:  Vernell Leep, MD, [place], [facility]. [facility]  To contact the attending provider between 7A-7P or the covering provider during after hours 7P-7A, please log into the web site [email or web address] and access using universal [place] password for that web site. If you do not have the password, please call the hospital operator.

## 2019-04-29 NOTE — Progress Notes (Signed)
Progress Note  Patient Name: Kristina [name] Date of Encounter: 04/29/2019  Primary Cardiologist: Quay Burow, MD   Subjective   Denies any chest pain or dyspnea.  Inpatient Medications    Scheduled Meds:  atorvastatin  80 mg Oral q1800   cholecalciferol  5,000 Units Oral Daily   feeding supplement (ENSURE ENLIVE)  237 mL Oral TID BM   furosemide  20 mg Oral Daily   losartan  50 mg Oral Daily   pantoprazole  40 mg Oral BID   Continuous Infusions:  PRN Meds: albuterol, nitroGLYCERIN   Vital Signs    Vitals:   04/28/19 0518 04/28/19 1500 04/28/19 2010 04/29/19 0425  BP: 113/67 115/70 126/64 122/74  Pulse: 92 80 95 71  Resp: 19 18 20 19   Temp: 98.6 F (37 C) 98.4 F (36.9 C) 98 F (36.7 C) 97.9 F (36.6 C)  TempSrc: Oral Oral Oral Oral  SpO2: 99% 98% 98% 99%  Weight: 88.1 kg     Height:       No intake or output data in the 24 hours ending 04/29/19 0906 Last 3 Weights 04/28/2019 04/27/2019 04/25/2019  Weight (lbs) 194 lb 3.6 oz 196 lb 6.9 oz 196 lb 6.9 oz  Weight (kg) 88.1 kg 89.1 kg 89.1 kg      Telemetry    Not on telemetry - Personally Reviewed  ECG    NO new ECG - Personally Reviewed  Physical Exam   GEN: No acute distress.   Neck: No JVD Cardiac: RRR, no murmurs Respiratory: Clear to auscultation bilaterally. GI: Soft, nontender, non-distended  MS: No edema Neuro:  Nonfocal  Psych: Normal affect   Labs    High Sensitivity Troponin:   Recent Labs  Lab 04/11/19 0624  TROPONINIHS 10      Chemistry Recent Labs  Lab 04/25/19 1100 04/27/19 0831  NA 139 144  K 3.6 3.9  CL 102 107  CO2 26 26  GLUCOSE 108* 98  BUN 19 12  CREATININE 0.95 0.81  CALCIUM 8.6* 8.5*  PROT 5.9* 5.9*  ALBUMIN 2.6* 2.5*  AST 17 14*  ALT 21 17  ALKPHOS 55 56  BILITOT 1.0 0.8  GFRNONAA >60 >60  GFRAA >60 >60  ANIONGAP 11 11     Hematology Recent Labs  Lab 04/27/19 0831 04/28/19 0357 04/29/19 0654  WBC 5.9 8.6 5.5  RBC 3.25* 3.06*  3.00*  HGB 9.8* 9.2* 9.1*  HCT 30.1* 28.7* 28.2*  MCV 92.6 93.8 94.0  MCH 30.2 30.1 30.3  MCHC 32.6 32.1 32.3  RDW 14.6 14.7 14.6  PLT 215 217 214    BNPNo results for input(s): BNP, PROBNP in the last 168 hours.   DDimer No results for input(s): DDIMER in the last 168 hours.   Radiology    CT ANGIO CHEST PE W OR WO CONTRAST  Result Date: 04/27/2019 CLINICAL DATA:  Shortness of breath. EXAM: CT ANGIOGRAPHY CHEST WITH CONTRAST TECHNIQUE: Multidetector CT imaging of the chest was performed using the standard protocol during bolus administration of intravenous contrast. Multiplanar CT image reconstructions and MIPs were obtained to evaluate the vascular anatomy. CONTRAST:  181mL OMNIPAQUE IOHEXOL 350 MG/ML SOLN COMPARISON:  04/10/2019 FINDINGS: Cardiovascular: Contrast injection is sufficient to demonstrate satisfactory opacification of the pulmonary arteries to the segmental level. There is no pulmonary embolus. The main pulmonary artery is within normal limits for size. There is no CT evidence of acute right heart strain. The visualized aorta is normal. Heart size is enlarged.  Coronary artery calcifications are noted. There is no significant pericardial effusion. Mediastinum/Nodes: --No mediastinal or hilar lymphadenopathy. --No axillary lymphadenopathy. --No supraclavicular lymphadenopathy. --Normal thyroid gland. --The esophagus is unremarkable Lungs/Pleura: There are new scattered pulmonary opacities bilaterally, greatest within the lower lobes. There is no pneumothorax. There is no significant pleural effusion. The trachea is unremarkable. Upper Abdomen: No acute abnormality. Musculoskeletal: No chest wall abnormality. No acute or significant osseous findings. Review of the MIP images confirms the above findings. IMPRESSION: 1. No evidence of acute pulmonary embolus. 2. New scattered pulmonary opacities bilaterally, greatest within the lower lobes, consistent with the patient's history of  COVID-19 pneumonia. Electronically Signed   By: Constance Holster M.D.   On: 04/27/2019 22:45   CT ABDOMEN PELVIS W CONTRAST  Result Date: 04/28/2019 CLINICAL DATA:  colorectal cancer, staging sigmoid mass EXAM: CT ABDOMEN AND PELVIS WITH CONTRAST TECHNIQUE: Multidetector CT imaging of the abdomen and pelvis was performed using the standard protocol following bolus administration of intravenous contrast. CONTRAST:  178mL OMNIPAQUE IOHEXOL 350 MG/ML SOLN COMPARISON:  None. FINDINGS: Lower chest: Nodular airspace opacities at the lung bases. Hepatobiliary: No focal hepatic lesion. No biliary duct dilatation. Gallbladder is normal. Common bile duct is normal. Pancreas: Pancreas is normal. No ductal dilatation. No pancreatic inflammation. Spleen: Normal spleen Adrenals/urinary tract: Adrenal glands normal. Simple fluid attenuation cyst of the LEFT kidney. Ureters and bladder normal. Stomach/Bowel: Stomach, duodenum and small bowel normal. Normal cecum. Appendix not identified. Ascending transverse descending colon normal. In the mid sigmoid colon, there is eccentric thickening measuring 3.2 cm by 2.5 cm (image 69/3). No evidence of obstruction proximal to lesion. There is no enlarged lymph node in the sigmoid mesocolon. No presacral adenopathy or iliac adenopathy. No retroperitoneal adenopathy. Vascular/Lymphatic: Abdominal aorta is normal caliber. No periportal or retroperitoneal adenopathy. No pelvic adenopathy. Reproductive: Post hysterectomy Other: No free fluid. Musculoskeletal: No aggressive osseous lesion. IMPRESSION: 1. Eccentric thickening in the mid sigmoid colon correlates to abnormal findings on colonoscopy. No bowel obstruction 2. No evidence of metastatic adenopathy within the sigmoid mesocolon or elsewhere in the abdomen or pelvis. 3. No liver metastasis. 4. Nodular airspace disease in the lower lobes consistent with COVID pneumonia Electronically Signed   By: Suzy Bouchard M.D.   On: 04/28/2019  05:27    Cardiac Studies   Cath 06/23/16: 1. Severe 2 vessel coronary artery disease with critical stenosis of the proximal LAD and severe stenosis of the mid circumflex 2. Successful PCI to proximal LAD using a 4.0 mm Synergy DES postdilated with a 4.5 mm noncompliant balloon 3. Patent RCA with nonobstructive disease of line 4. Normal LV systolic function  Recommendations:  Dual antiplatelet therapy with aspirin and brilinta for a minimum of 1 year  Medical therapy for her residual CAD  The left circumflex is severely diseased, but there is severe angulation proximal to the lesion. It might be best to treat her medically and if refractory angina occurs she could be considered for PCI of the circumflex.     Patient Profile     74 y.o. female with past medical history of CAD (s/p NSTEMI in 06/2016 with DES to proximal LAD and severe LCX disease complicated by severe angulation proximal to the lesion with medical management recommended), HTN and HLD who is being seen today for the evaluation of preoperative cardiac evaluation.  Recent COVID PNA admission 12/30-1/5/2 where she was found to have segmental PE. Was started on Xarelto and continued on ASA, plavix.  Presented with hematochezia/melena.  EGD showed acute gastritis with shallow ulceration, colonoscopy showed 3-4cm mass in sigmoid colon suspicious for malignancy.    Assessment & Plan    Pre-op evaluation: found to have sigmoid colon mass, planning for resection.  S/p PCI to pLAD in 06/2016.  No current anginal symptoms.  Reports good functional capacity, >4 METs, and denies any exertional symptoms.  Normal EF at time of cath.  RCRI score 2 given CAD, intraperitoneal surgery. Overall would classify as intermediate risk for an intermediate risk surgery.  -No further cardiac work-up recommended prior to surgery -Given prior coronary stenting, recommend restarting ASA as soon as OK per bleeding standpoint.  Does not need plavix moving  forward  PE: xarelto on hold given GI bleed    For questions or updates, please contact [place] Please consult [email or web address] for contact info under        Signed, Donato Heinz, MD  04/29/2019, 9:06 AM

## 2019-04-29 NOTE — Progress Notes (Signed)
VASCULAR LAB PRELIMINARY  PRELIMINARY  PRELIMINARY  PRELIMINARY  Bilateral lower extremity venous duplex completed.    Preliminary report:  See CV proc for preliminary results.   Kaylor Simenson, RVT 04/29/2019, 1:21 PM

## 2019-04-30 ENCOUNTER — Other Ambulatory Visit: Payer: Self-pay | Admitting: Physician Assistant

## 2019-04-30 LAB — SURGICAL PATHOLOGY

## 2019-04-30 NOTE — Telephone Encounter (Signed)
Spoke with pt and she is aware of appt. 

## 2019-05-07 ENCOUNTER — Telehealth: Payer: Self-pay | Admitting: *Deleted

## 2019-05-07 ENCOUNTER — Ambulatory Visit: Payer: Self-pay | Admitting: Surgery

## 2019-05-07 NOTE — H&P (Signed)
CC: Newly diagnosed sigmoid mass  HPI: Kristina [name] is a very pleasant 74yoF with hx of HTN, CAD (s/p PCI/stent previously on plavix), chronic diastolic CHF, recent COVID 85 pneumonia admitted 04/10/2019 for this issue. During her treatment of all that, she was found to have a segmental pulmonary bolus and started on anticoagulation. She was readmitted 04/27/2019 with near syncope and orthostatic hypotension. She was worked up for also having had melanotic stool and found to have fungating mass in her sigmoid (see below). She was seen by multiple specialists during this admission including cardiology and pulmonology. Her blood thinners were stopped after her CTA chest showed no active embolus at this time. This was done with pulmonary consultation. They also did Dopplers of her lower extremities with plans that should she have any blood clots that they would go ahead and place a filter. Her Dopplers do not show any blood clots. Pulmonology felt that it would be best if her surgery was delayed until she is at least a month out from her Covid recovery prior to proceeding with surgery due to the inflammatory milieu being present for at least a month and increasing her risk of venous thromboembolism with surgery. Given that her bleeding stopped once her anticoagulation was held, she was deemed stable for discharge. Cardiology has seen her as well and noted that before surgery, she would be an intermediate risk but had no plans for any additional optimization that she would benefit from preoperatively. They also cleared her for coming off her Plavix.  She had a colonoscopy with Dr. [name] 04/27/2019 she was found to have a frond-like villous fungating and nonobstructing mass the distal sigmoid colon that was non-circumferential. It measured 3-4 cm in size and was noted to be 25 cm from the dentate line. Using this present. Biopsies were taken. The area distal to the mass was tattooed with spot. Biopsies  returned as a 2 out now with at least high-grade dysplasia. Given its endoscopic appearance it was also not thought to be amenable to endoscopic removal. She underwent staging CT chest/had an/pelvis which demonstrated the previously noted segmental polar emboli to have resolved. New scattered opacities in the lower lobes were identified consistent with her known history of COVID-19 pneumonia. The CT abdomen/pelvis showed eccentric thickening in the mid sigmoid which correlates to the endoscopic findings. No evidence of metastatic adenopathy or metastasis to the liver or elsewhere. CEA was 3.0. Albumin was 2.5 on her most recent check but noted to be 4.4, 4 months ago. She since discharge from the hospital has been on protein shakes taking in now to 3 times a day. This is in addition to eating 3 meals a day. She denies any issues with fevers/chills/nausea/vomiting. She is having regular bowel movements. She denies any blood in her stool. She denies any abdominal pain.  PMH: HTN (well controlled on oral antihypertensive), CAD (s/p PCI/stent previously on plavix), chronic diastolic CHF (well compensated on lasix), recent COVID 19 pneumonia (now resolved from symptom standpoint)  PSH: Partial hysterectomy; laparoscopic assisted?  FHx: Denies FHx of malignancy  Social: Denies use of tobacco/EtOH/drugs. 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  The patient is a 74 year old female.   Past Surgical History Geni Bers [place], RMA; 05/07/2019 9:12 AM) Hysterectomy (not due to cancer) - Partial   Diagnostic Studies History Geni Bers  [place], RMA; 05/07/2019 9:12 AM) Colonoscopy  within last year Mammogram  1-3 years ago Pap Smear  >5 years ago  Allergies [employer], RMA; 05/07/2019 9:12 AM) No Known Drug Allergies  [05/07/2019]: Allergies Reconciled   Medication History [employer], RMA; 05/07/2019 9:15 AM) Albuterol Sulfate (108 (90 Base)MCG/ACT Aero Pow Br Act, Inhalation) Active. Ascorbic Acid (500MG  Tablet Chewable, Oral) Active. Aspirin (81MG  Tablet DR, Oral) Active. Atorvastatin Calcium (80MG  Tablet, Oral) Active. Furosemide (20MG  Tablet, Oral) Active. Losartan Potassium (50MG  Tablet, Oral) Active. Nitroglycerin (0.4MG  Tab Sublingual, Sublingual) Active. Pantoprazole Sodium (40MG  Tablet DR, Oral) Active. Vitamin D3 (25 MCG(1000 UT) Capsule, Oral) Active. Zinc (220 (50 Zn)MG Capsule, Oral) Active. Medications Reconciled  Social History Geni Bers [place], RMA; 05/07/2019 9:12 AM) Alcohol use  Occasional alcohol use. Caffeine use  Carbonated beverages, Coffee, Tea. No drug use  Tobacco use  Never smoker.  Family History Geni Bers [place], RMA; 05/07/2019 9:12 AM) Arthritis  Brother, Father, Mother, Sister. Cancer  Father. Cerebrovascular Accident  Mother. Heart Disease  Mother. Heart disease in female family member before age 27  Hypertension  Mother.  Pregnancy / Birth History Geni Bers [place], RMA; 05/07/2019 9:12 AM) Age at menarche  40 years. Age of menopause  <47 Contraceptive History  Intrauterine device. Gravida  2 Maternal age  22-20 Para  2  Other Problems Geni Bers [place], RMA; 05/07/2019 9:12 AM) Arthritis  Asthma  Back Pain  Chest pain  High blood pressure  Hypercholesterolemia  Myocardial infarction  Seizure Disorder     Review of Systems Geni Bers [place] RMA; 05/07/2019 9:12 AM) General Not Present- Appetite Loss, Chills, Fatigue, Fever, Night Sweats, Weight Gain and Weight Loss. Skin Not Present- Change in Wart/Mole, Dryness, Hives, Jaundice, New Lesions, Non-Healing Wounds, Rash and Ulcer. HEENT Not Present- Earache, Hearing Loss, Hoarseness, Nose Bleed, Oral Ulcers, Ringing in the Ears, Seasonal  Allergies, Sinus Pain, Sore Throat, Visual Disturbances, Wears glasses/contact lenses and Yellow Eyes. Respiratory Not Present- Bloody sputum, Chronic Cough, Difficulty Breathing, Snoring and Wheezing. Breast Not Present- Breast Mass, Breast Pain, Nipple Discharge and Skin Changes. Cardiovascular Not Present- Chest Pain, Difficulty Breathing Lying Down, Leg Cramps, Palpitations, Rapid Heart Rate, Shortness of Breath and Swelling of Extremities. Gastrointestinal Not Present- Abdominal Pain, Bloating, Bloody Stool, Change in Bowel Habits, Chronic diarrhea, Constipation, Difficulty Swallowing, Excessive gas, Gets full quickly at meals, Hemorrhoids, Indigestion, Nausea, Rectal Pain and Vomiting. Female Genitourinary Not Present- Frequency, Nocturia, Painful Urination, Pelvic Pain and Urgency. Musculoskeletal Not Present- Back Pain, Joint Pain, Joint Stiffness, Muscle Pain, Muscle Weakness and Swelling of Extremities. Neurological Not Present- Decreased Memory, Fainting, Headaches, Numbness, Seizures, Tingling, Tremor, Trouble walking and Weakness. Psychiatric Not Present- Anxiety, Bipolar, Change in Sleep Pattern, Depression, Fearful and Frequent crying. Endocrine Not Present- Cold Intolerance, Excessive Hunger, Hair Changes, Heat Intolerance, Hot flashes and New Diabetes. Hematology Present- Blood Thinners and Easy Bruising. Not Present- Excessive bleeding, Gland problems, HIV and Persistent Infections.  Vitals Geni Bers [place] RMA; 05/07/2019 9:13 AM) 05/07/2019 9:12 AM Weight: 200 lb Height: 66in Body Surface Area: 2 m Body Mass Index: 32.28 kg/m  Temp.: 97.53F(Temporal)  Pulse: 115 (Regular)  P.OX: 97% (Room air) BP: 150/92 (Sitting, Left Arm, Standard)       Physical Exam Harrell Gave M. Oyinkansola Truax MD; 05/07/2019 9:46 AM) The physical exam findings are as follows: Note:Constitutional: No acute distress; conversant; no deformities; wearing mask Eyes: Moist conjunctiva; no lid  lag; anicteric sclerae; pupils equal and round Neck: Trachea midline; no palpable thyromegaly Lungs: Normal respiratory effort; no tactile fremitus CV: rrr;  no palpable thrill; no pitting edema GI: Abdomen soft, nontender, nondistended; no palpable hepatosplenomegaly MSK: Normal gait; no clubbing/cyanosis Psychiatric: Appropriate affect; alert and oriented 3 Lymphatic: No palpable cervical or axillary lymphadenopathy    Assessment & Plan Harrell Gave M. Quintavia Rogstad MD; 05/07/2019 9:51 AM) CANCER OF SIGMOID COLON (C18.7) Story: Ms. Begg is a very pleasant 77yoF with hx of HTN, CAD (s/p PCI/stent previously on plavix), chronic diastolic CHF, recent COVID 19 pneumonia - recently discharged after being found to have mass in mid sigmoid that was source of melena on anticoagulation. Cleared to come off anticoagulation and plavix by pulm and cardiology. Mass bx showed TA with at least HGD; given endoscopic appearance, not amenable to endoscopic removal and favored to be a primary colon cancer with sampling missing an active area of invasive adenoCA Impression: -The anatomy and physiology of the GI tract was discussed at length with the patient. The pathophysiology of colon polyps and cancer was discussed at length with associated pictures. -Given all the above, we discussed proceeding with robotic sigmoidectomy, flexible sigmoidoscopy. We reviewed minimally evasive and potential open techniques to the surgery as well. We discussed that there is a high probability that this polyp actually is harboring cancer given its appearance and findings of at least high-grade dysplasia. -The planned procedure, material risks (including, but not limited to, pain, bleeding, infection, scarring, need for blood transfusion, damage to surrounding structures- blood vessels/nerves/viscus/organs, damage to ureter, leak from anastomosis, need for additional procedures, need for stoma which may be permanent, hernia, DVT/PE,  recurrence of cancer, pneumonia, heart attack, stroke, death) benefits and alternatives to surgery were discussed at length. Without surgery this will progress and ultimately may take her life. We also spent time highlighting the postoperative management and case scenarios where adjuvant/postop chemotherapy may be recommended. The patient's questions were answered to her satisfaction, she voiced understanding and elected to proceed with surgery. Additionally, we discussed typical postoperative expectations and the recovery process. -Cardiac clearance requested -Pulmonary clearance received - planning surgery no sooner than late Feb 2021 for surgery for allow "inflammatory milieu" to cool down from VTE risk standpoint This patient encounter took 65 minutes today to perform the following: take history, perform exam, review outside records, interpret imaging, counsel the patient on their diagnosis and document encounter, findings & plan in the EHR   Signed by Ileana Roup, MD (05/07/2019 9:52 AM)

## 2019-05-07 NOTE — Telephone Encounter (Signed)
   [facility] Pre-operative Risk Assessment    Request for surgical clearance:  1. What type of surgery is being performed? Robotic sigmoidectomy; flex sigmoidectomy   2. When is this surgery scheduled? TB   3. What type of clearance is required (medical clearance vs. Pharmacy clearance to hold med vs. Both)? both  4. Are there any medications that need to be held prior to surgery and how long? ASA   5. Practice name and name of physician performing surgery? [facility] Dr. Dema Severin   6. What is your office phone number [phone]    7.   What is your office fax number [phone] attn: Marguarite Arbour  RMA  8.   Anesthesia type (None, local, MAC, general) ? general   Kristina [name] Kristina [name] 05/07/2019, 4:35 PM  _________________________________________________________________   (provider comments below)  OV 1/27 with Dr. Gwenlyn Found

## 2019-05-08 ENCOUNTER — Encounter: Payer: Self-pay | Admitting: Cardiovascular Disease

## 2019-05-08 ENCOUNTER — Other Ambulatory Visit: Payer: Self-pay

## 2019-05-08 ENCOUNTER — Ambulatory Visit (INDEPENDENT_AMBULATORY_CARE_PROVIDER_SITE_OTHER): Payer: 59 | Admitting: Cardiovascular Disease

## 2019-05-08 VITALS — BP 144/82 | HR 80 | Temp 97.1°F | Ht 67.0 in | Wt 199.0 lb

## 2019-05-08 DIAGNOSIS — I2510 Atherosclerotic heart disease of native coronary artery without angina pectoris: Secondary | ICD-10-CM

## 2019-05-08 DIAGNOSIS — E785 Hyperlipidemia, unspecified: Secondary | ICD-10-CM

## 2019-05-08 DIAGNOSIS — I10 Essential (primary) hypertension: Secondary | ICD-10-CM

## 2019-05-08 DIAGNOSIS — Z9861 Coronary angioplasty status: Secondary | ICD-10-CM | POA: Diagnosis not present

## 2019-05-08 NOTE — Assessment & Plan Note (Signed)
History of CAD status post proximal LAD stenting by Dr. Burt Knack 06/22/2016 in the setting of a non-STEMI.  A 4 mm Synergy stent was deployed and postdilated to 4.5 mm.  She did have residual disease in her circumflex however this was a tortuous vessel and medical therapy was recommended.  She had normal LV function.  She was on aspirin Plavix until recently when she developed GI bleed.  She did have pulmonary embolus and was placed on Xarelto which is since stopped as well as her Plavix.  She denies chest pain or shortness of breath.  She does need colon resection and she can be cleared at low risk for this.

## 2019-05-08 NOTE — Assessment & Plan Note (Signed)
History of essential hypertension blood pressure measured at 144/82.  She is on losartan 50 mg a day.

## 2019-05-08 NOTE — Patient Instructions (Signed)
Medication Instructions:  Your physician recommends that you continue on your current medications as directed. Please refer to the Current Medication list given to you today.  If you need a refill on your cardiac medications before your next appointment, please call your pharmacy.   Lab work: Fasting Lipids and Hepatic Panel If you have labs (blood work) drawn today and your tests are completely normal, you will receive your results only by: MyChart Message (if you have MyChart) OR A paper copy in the mail If you have any lab test that is abnormal or we need to change your treatment, we will call you to review the results.  Testing/Procedures: NONE  Follow-Up: At [facility], you and your health needs are our priority.  As part of our continuing mission to provide you with exceptional heart care, we have created designated Provider Care Teams.  These Care Teams include your primary Cardiologist (physician) and Advanced Practice Providers (APPs -  Physician Assistants and Nurse Practitioners) who all work together to provide you with the care you need, when you need it. You may see Quay Burow, MD or one of the following Advanced Practice Providers on your designated Care Team:    Kerin Ransom, PA-C  [place], [place]  Coletta Memos, [place]  Your physician wants you to follow-up in: 6 months with a physicians assistant Your physician wants you to follow-up in: 1 year with Dr. Gwenlyn Found

## 2019-05-08 NOTE — Telephone Encounter (Signed)
   Primary Cardiologist: Quay Burow, MD   Patient is already cleared for surgery when admitted "Donato Heinz, MD  04/29/2019, 9:06 AM    Pre-op evaluation: found to have sigmoid colon mass, planning for resection.  S/p PCI to pLAD in 06/2016.  No current anginal symptoms.  Reports good functional capacity, >4 METs, and denies any exertional symptoms.  Normal EF at time of cath.  RCRI score 2 given CAD, intraperitoneal surgery. Overall would classify as intermediate risk for an intermediate risk surgery.  -No further cardiac work-up recommended prior to surgery -Given prior coronary stenting, recommend restarting ASA as soon as OK per bleeding standpoint.  Does not need plavix moving forward".   I will route this recommendation to the requesting party via Epic fax function and remove from pre-op pool.  Please call with questions.  [place], [place] 05/08/2019, 8:37 AM

## 2019-05-08 NOTE — Assessment & Plan Note (Signed)
History of dyslipidemia on high-dose statin therapy.  We will recheck a lipid liver profile.

## 2019-05-08 NOTE — Progress Notes (Signed)
05/08/2019 Kristina [name]   Dec 14, 1945  [id number]  Primary Physician Nolene Ebbs, MD Primary Cardiologist: [name] MD Kristina [name], [place]  HPI:  Kristina [name] is a 74 y.o.  moderately overweight single African-American female mother of 2, grandmother of 6 grandchildren and great grandmother of 4 great grandsons who I am seeing back for 1 year follow-up. Primary care provider is Dr.Avebeure.  I last saw her in the office 12/18/2018.Marland KitchenShe has a history of treated hypertension and hyperlipidemia. She does not smoke and drinks socially. She had a non-STEMI 06/22/2016 and underwent cardiac catheterization by Dr. Burt Knack at that time the following day revealing a high-grade proximal LAD stenosis treated with a 4 mm Synergy drug-eluting stent postdilated to 4.5 mm. She did have moderately high-grade circumflex obtuse marginal branch over this was a tortuous vessel and was treated medically. Her RCA was free of disease and she had normal LV function.   Since I saw her in the office 4 months ago she was admitted to the hospital with Covid pneumonia this was complicated by pulmonary embolism and GI bleeding.  She ultimately recovered from this.  She underwent upper and lower endoscopy revealing erosive gastritis and a 3 to 4 cm colonic polyp.  Her oral anticoagulation was stopped as was her Plavix.  She feels clinically improved.  She is being scheduled for colon resection.   Current Meds  Medication Sig  . albuterol (VENTOLIN HFA) 108 (90 Base) MCG/ACT inhaler Inhale 2 puffs into the lungs 2 (two) times daily as needed for wheezing or shortness of breath.  [name] ascorbic acid (VITAMIN C) 500 MG tablet Take 1 tablet (500 mg total) by mouth daily. (Patient taking differently: Take 500 mg by mouth 2 (two) times daily. )  . aspirin 81 MG chewable tablet Chew 1 tablet (81 mg total) by mouth daily.  [name] atorvastatin (LIPITOR) 80 MG tablet Take 1 tablet (80 mg total) by mouth daily at 6 PM.   . furosemide (LASIX) 20 MG tablet Take 20 mg by mouth daily.   [name] losartan (COZAAR) 50 MG tablet Take 50 mg by mouth daily.  . nitroGLYCERIN (NITROSTAT) 0.4 MG SL tablet Place 1 tablet (0.4 mg total) under the tongue every 5 (five) minutes as needed for chest pain.  . pantoprazole (PROTONIX) 40 MG tablet Take 1 tablet (40 mg total) by mouth 2 (two) times daily.  . Vitamin D3 (VITAMIN D) 25 MCG tablet Take 5 tablets (5,000 Units total) by mouth daily.  [name] zinc sulfate 220 (50 Zn) MG capsule Take 1 capsule (220 mg total) by mouth daily.  . [DISCONTINUED] albuterol (VENTOLIN HFA) 108 (90 Base) MCG/ACT inhaler Inhale 2 puffs into the lungs 2 (two) times daily as needed for wheezing or shortness of breath.     Allergies  Allergen Reactions  . Tramadol Itching    Social History   Socioeconomic History  . Marital status: Divorced    Spouse name: Not on file  . Number of children: Not on file  . Years of education: Not on file  . Highest education level: Not on file  Occupational History  . Not on file  Tobacco Use  . Smoking status: Never Smoker  . Smokeless tobacco: Never Used  Substance and Sexual Activity  . Alcohol use: Yes    Alcohol/week: 6.0 standard drinks    Types: 6 Glasses of wine per week  . Drug use: No  . Sexual activity: Not Currently  Other  Topics Concern  . Not on file  Social History Narrative  . Not on file   Social Determinants of Health   Financial Resource Strain:   . Difficulty of Paying Living Expenses: Not on file  Food Insecurity:   . Worried About Charity fundraiser in the Last Year: Not on file  . Ran Out of Food in the Last Year: Not on file  Transportation Needs:   . Lack of Transportation (Medical): Not on file  . Lack of Transportation (Non-Medical): Not on file  Physical Activity:   . Days of Exercise per Week: Not on file  . Minutes of Exercise per Session: Not on file  Stress:   . Feeling of Stress : Not on file  Social Connections:    . Frequency of Communication with Friends and Family: Not on file  . Frequency of Social Gatherings with Friends and Family: Not on file  . Attends Religious Services: Not on file  . Active Member of Clubs or Organizations: Not on file  . Attends Archivist Meetings: Not on file  . Marital Status: Not on file  Intimate Partner Violence:   . Fear of Current or Ex-Partner: Not on file  . Emotionally Abused: Not on file  . Physically Abused: Not on file  . Sexually Abused: Not on file     Review of Systems: General: negative for chills, fever, night sweats or weight changes.  Cardiovascular: negative for chest pain, dyspnea on exertion, edema, orthopnea, palpitations, paroxysmal nocturnal dyspnea or shortness of breath Dermatological: negative for rash Respiratory: negative for cough or wheezing Urologic: negative for hematuria Abdominal: negative for nausea, vomiting, diarrhea, bright red blood per rectum, melena, or hematemesis Neurologic: negative for visual changes, syncope, or dizziness All other systems reviewed and are otherwise negative except as noted above.    Blood pressure (!) 144/82, pulse 80, temperature (!) 97.1 F (36.2 C), height 5\' 7"  (1.702 m), weight 199 lb (90.3 kg).  General appearance: alert and no distress Neck: no adenopathy, no carotid bruit, no JVD, supple, symmetrical, trachea midline and thyroid not enlarged, symmetric, no tenderness/mass/nodules Lungs: clear to auscultation bilaterally Heart: regular rate and rhythm, S1, S2 normal, no murmur, click, rub or gallop Extremities: extremities normal, atraumatic, no cyanosis or edema Pulses: 2+ and symmetric Skin: Skin color, texture, turgor normal. No rashes or lesions Neurologic: Alert and oriented X 3, normal strength and tone. Normal symmetric reflexes. Normal coordination and gait  EKG not performed today  ASSESSMENT AND PLAN:   Benign essential HTN History of essential hypertension blood  pressure measured at 144/82.  She is on losartan 50 mg a day.  CAD S/P percutaneous coronary angioplasty History of CAD status post proximal LAD stenting by Dr. Burt Knack 06/22/2016 in the setting of a non-STEMI.  A 4 mm Synergy stent was deployed and postdilated to 4.5 mm.  She did have residual disease in her circumflex however this was a tortuous vessel and medical therapy was recommended.  She had normal LV function.  She was on aspirin Plavix until recently when she developed GI bleed.  She did have pulmonary embolus and was placed on Xarelto which is since stopped as well as her Plavix.  She denies chest pain or shortness of breath.  She does need colon resection and she can be cleared at low risk for this.  Dyslipidemia History of dyslipidemia on high-dose statin therapy.  We will recheck a lipid liver profile.      Roderic Palau  Adora Fridge MD FACP,FACC,FAHA, [facility] 05/08/2019 2:50 PM

## 2019-05-10 LAB — LIPID PANEL
Chol/HDL Ratio: 2.4 ratio (ref 0.0–4.4)
Cholesterol, Total: 164 mg/dL (ref 100–199)
HDL: 69 mg/dL (ref >39)
LDL Chol Calc (NIH): 71 mg/dL (ref 0–99)
Triglycerides: 144 mg/dL (ref 0–149)
VLDL Cholesterol Cal: 24 mg/dL (ref 5–40)

## 2019-05-10 LAB — HEPATIC FUNCTION PANEL
ALT: 9 IU/L (ref 0–32)
AST: 8 IU/L (ref 0–40)
Albumin: 3.9 g/dL (ref 3.7–4.7)
Alkaline Phosphatase: 98 IU/L (ref 39–117)
Bilirubin Total: 0.4 mg/dL (ref 0.0–1.2)
Bilirubin, Direct: 0.11 mg/dL (ref 0.00–0.40)
Total Protein: 6.5 g/dL (ref 6.0–8.5)

## 2019-05-13 ENCOUNTER — Encounter: Payer: Self-pay | Admitting: Internal Medicine

## 2019-05-13 ENCOUNTER — Ambulatory Visit: Payer: 59 | Admitting: Internal Medicine

## 2019-05-13 VITALS — BP 142/78 | HR 76 | Temp 97.3°F | Ht 67.0 in | Wt 199.0 lb

## 2019-05-13 DIAGNOSIS — K2900 Acute gastritis without bleeding: Secondary | ICD-10-CM | POA: Diagnosis not present

## 2019-05-13 DIAGNOSIS — K625 Hemorrhage of anus and rectum: Secondary | ICD-10-CM

## 2019-05-13 DIAGNOSIS — D374 Neoplasm of uncertain behavior of colon: Secondary | ICD-10-CM

## 2019-05-13 NOTE — Patient Instructions (Addendum)
Take Pantoprazole twice daily until Feb 15 202, then decrease to once daily thereafter.   You will need Colon in August 2021. We will place recall for that and contact you at later time to schedule.   If you are age 72 or older, your body mass index should be between 23-30. Your Body mass index is 31.17 kg/m. If this is out of the aforementioned range listed, please consider follow up with your Primary Care Provider.  If you are age 71 or younger, your body mass index should be between 19-25. Your Body mass index is 31.17 kg/m. If this is out of the aformentioned range listed, please consider follow up with your Primary Care Provider.    Thank you for choosing me and [facility].  Dr.Pyrtle

## 2019-05-13 NOTE — Progress Notes (Signed)
Subjective:    Patient ID: Kristina [name], female    DOB: 04-14-1945, 74 y.o.   MRN: [id number]  HPI Kristina [name] is a 74 year old female with a recent history of ZCHYI-50 complicated by small pulmonary embolism started on anticoagulation who was readmitted to the hospital with rectal bleeding and acute anemia found to have a sigmoid colon neoplasm with at least high-grade dysplasia, gastritis with ulcers who is seen today for follow-up.  She also has a history of hypertension, CAD with prior PCI to the LAD in March 2018, history of seizures.  During her recent second hospitalization I performed an upper endoscopy and colonoscopy on 04/27/2019. EGD revealed moderate gastritis with shallow ulcerations in the antrum and prepyloric stomach.  There was no H. pylori or evidence for dysplasia/malignancy.  She has been on twice daily PPI since this time. Colonoscopy on the same day revealed a frond-like/villous and fungating nonobstructing mass in the distal sigmoid.  This was biopsied extensively and there was oozing present prior to biopsy.  This lesion was tattooed distally and found to be adenomatous with at least high-grade dysplasia.  Ultimately prior to discharge CT of the abdomen pelvis was done which did not show any evidence for metastatic disease.  CT chest PE protocol was repeated which did not show residual PE.  Her anticoagulation was then discontinued given the lack of PE but also recent GI bleeding.  Cardiology was consulted and given the age of her cardiac stent her Plavix was also discontinued.  Today she reports she is feeling very well.  No further bleeding.  No abdominal pain.  Bowel movements have been regular.  No melena.  No upper GI or hepatobiliary complaint.  No dyspnea or chest pain.  No cough.  She met with Dr. Dema Severin and will undergo robotic assisted sigmoidectomy on June 13, 2019.   Review of Systems As per HPI, otherwise negative  Current Medications, Allergies, Past  Medical History, Past Surgical History, Family History and Social History were reviewed in [employer] record.     Objective:   Physical Exam BP (!) 142/78   Pulse 76   Temp (!) 97.3 F (36.3 C)   Ht '5\' 7"'$  (1.702 m)   Wt 199 lb (90.3 kg)   BMI 31.17 kg/m  Gen: awake, alert, NAD HEENT: anicteric CV: RRR, no mrg Pulm: CTA b/l Abd: soft, NT/ND, +BS throughout Ext: no c/c/e Neuro: nonfocal      Assessment & Plan:  74 year old female with a recent history of YDXAJ-28 complicated by small pulmonary embolism started on anticoagulation who was readmitted to the hospital with rectal bleeding and acute anemia found to have a sigmoid colon neoplasm with at least high-grade dysplasia, gastritis with ulcers who is seen today for follow-up.   1.  Sigmoid tumor --at least an advanced adenoma with high-grade dysplasia though I am suspicious that there could be malignancy.  We have previously discussed sample bias until this lesion is removed entirely we will not know for sure.  Fortunately this is scheduled for next month.  She has not had any further bleeding off of anticoagulation. --She will proceed with robotic assisted sigmoidectomy on 06/13/2019 --I recommended 3 to 4 months after sigmoid resection that we repeat colonoscopy to rule out any additional polyps given that her prep was not good or excellent at the time of her colonoscopy while she was admitted --Recall colonoscopy July or August 2021  2.  Gastritis with ulcers --H. pylori negative.  No further bleeding.  She will complete 1 month of twice daily PPI and then around 05/27/2019 she can reduce pantoprazole to once per day.  I recommended we continue pantoprazole through her operative.  And for about 1 month after her colon surgery.  After that she can likely discontinue PPI therapy altogether.  3.  Recent COVID-19 --resolved without residual complication.  She asked about vaccination which she should consider and  is recommended around 12 weeks after her initial hospitalization  30 minutes total spent today including patient facing time, coordination of care, reviewing medical history/procedures/pertinent radiology studies, and documentation of the encounter.

## 2019-05-28 ENCOUNTER — Telehealth: Payer: Self-pay | Admitting: Internal Medicine

## 2019-05-28 MED ORDER — PANTOPRAZOLE SODIUM 40 MG PO TBEC: 40.0000 mg | DELAYED_RELEASE_TABLET | Freq: Every day | ORAL | 1 refills | Status: DC

## 2019-05-28 NOTE — Telephone Encounter (Signed)
Patient is calling and is seeking a refill on Pantoprazole states she only has one day's worth left and she needs it for her procedure scheduled 06/13/19 with Dr. Dema Severin she is wondering if we can help her with request.

## 2019-05-28 NOTE — Telephone Encounter (Signed)
I have sent pantoprazole 40 mg once daily to pharmacy x 2 more months (per Dr Vena Rua last office note, patient needed twice daily dosing x 1 month, then decrease to once daily dosing x 1 month, then remain on once daily dosing for 1 more month post surgery). Patient verbalizes understanding of this and has requested script be sent to [facility] at [facility].

## 2019-06-04 NOTE — Patient Instructions (Signed)
DUE TO COVID-19 ONLY ONE VISITOR IS ALLOWED TO COME WITH YOU AND STAY IN THE WAITING ROOM ONLY DURING PRE OP AND PROCEDURE DAY OF SURGERY. THE 1 VISITOR MAY VISIT WITH YOU AFTER SURGERY IN YOUR PRIVATE ROOM DURING VISITING HOURS ONLY!                  KALY FICHTNER   Your procedure is scheduled on: 06/13/19   Report to [facility] Main  Entrance   Report to admitting at  11:00 AM     Call this number if you have problems the morning of surgery [phone]   Follow all instructions given by the Dr. For the bowel prep.  Drink plenty of fluid the day before to prevent dehydration.  Drink 2 pre surgery ensures at 10:00 PM the night before surgery.  No food after midnight  You may have clear liquids until 10:00 AM    CLEAR LIQUID DIET   Foods Allowed                                                                     Foods Excluded  Coffee and tea, regular and decaf                             liquids that you cannot  Plain Jell-O any favor except red or purple                                           see through such as: Fruit ices (not with fruit pulp)                                     milk, soups, orange juice  Iced Popsicles                                    All solid food Carbonated beverages, regular and diet                                    Cranberry, grape and apple juices Sports drinks like Gatorade Lightly seasoned clear broth or consume(fat free) Sugar, honey syrup  At 10:00 AM drink the last pre surgical insure then nothing more by mouth. _  BRUSH YOUR TEETH MORNING OF SURGERY AND RINSE YOUR MOUTH OUT, NO CHEWING GUM CANDY OR MINTS.     Take these medicines the morning of surgery with A SIP OF WATER: Protonix                                 You may not have any metal on your body including hair pins and              piercings  Do not wear jewelry, make-up, lotions, powders or perfumes, deodorant  Do not wear nail polish on your  fingernails.  Do not shave  48 hours prior to surgery.               Do not bring valuables to the hospital. [place].  Contacts, dentures or bridgework may not be worn into surgery.                   Please read over the following fact sheets you were given: _____________________________________________________________________             [facility] - Preparing for Surgery  Before surgery, you can play an important role.   Because skin is not sterile, your skin needs to be as free of germs as possible.   You can reduce the number of germs on your skin by washing with CHG (chlorahexidine gluconate) soap before surgery.   CHG is an antiseptic cleaner which kills germs and bonds with the skin to continue killing germs even after washing. Please DO NOT use if you have an allergy to CHG or antibacterial soaps.   If your skin becomes reddened/irritated stop using the CHG and inform your nurse when you arrive at Short Stay. Do not shave (including legs and underarms) for at least 48 hours prior to the first CHG shower.    Please follow these instructions carefully:  1.  Shower with CHG Soap the night before surgery and the  morning of Surgery.  2.  If you choose to wash your hair, wash your hair first as usual with your  normal  shampoo.  3.  After you shampoo, rinse your hair and body thoroughly to remove the  shampoo.                                        4.  Use CHG as you would any other liquid soap.  You can apply chg directly  to the skin and wash                       Gently with a scrungie or clean washcloth.  5.  Apply the CHG Soap to your body ONLY FROM THE NECK DOWN.   Do not use on face/ open                           Wound or open sores. Avoid contact with eyes, ears mouth and genitals (private parts).                       Wash face,  Genitals (private parts) with your normal soap.             6.  Wash thoroughly, paying  special attention to the area where your surgery  will be performed.  7.  Thoroughly rinse your body with warm water from the neck down.  8.  DO NOT shower/wash with your normal soap after using and rinsing off  the CHG Soap.             9.  Pat yourself dry with a clean towel.            10.  Wear clean pajamas.  11.  Place clean sheets on your bed the night of your first shower and do not  sleep with pets. Day of Surgery : Do not apply any lotions/deodorants the morning of surgery.  Please wear clean clothes to the hospital/surgery center.  FAILURE TO FOLLOW THESE INSTRUCTIONS MAY RESULT IN THE CANCELLATION OF YOUR SURGERY PATIENT SIGNATURE_________________________________  NURSE SIGNATURE__________________________________  ________________________________________________________________________   Adam Phenix  An incentive spirometer is a tool that can help keep your lungs clear and active. This tool measures how well you are filling your lungs with each breath. Taking long deep breaths may help reverse or decrease the chance of developing breathing (pulmonary) problems (especially infection) following:  A long period of time when you are unable to move or be active. BEFORE THE PROCEDURE   If the spirometer includes an indicator to show your best effort, your nurse or respiratory therapist will set it to a desired goal.  If possible, sit up straight or lean slightly forward. Try not to slouch.  Hold the incentive spirometer in an upright position. INSTRUCTIONS FOR USE  1. Sit on the edge of your bed if possible, or sit up as far as you can in bed or on a chair. 2. Hold the incentive spirometer in an upright position. 3. Breathe out normally. 4. Place the mouthpiece in your mouth and seal your lips tightly around it. 5. Breathe in slowly and as deeply as possible, raising the piston or the ball toward the top of the column. 6. Hold your breath for 3-5 seconds or  for as long as possible. Allow the piston or ball to fall to the bottom of the column. 7. Remove the mouthpiece from your mouth and breathe out normally. 8. Rest for a few seconds and repeat Steps 1 through 7 at least 10 times every 1-2 hours when you are awake. Take your time and take a few normal breaths between deep breaths. 9. The spirometer may include an indicator to show your best effort. Use the indicator as a goal to work toward during each repetition. 10. After each set of 10 deep breaths, practice coughing to be sure your lungs are clear. If you have an incision (the cut made at the time of surgery), support your incision when coughing by placing a pillow or rolled up towels firmly against it. Once you are able to get out of bed, walk around indoors and cough well. You may stop using the incentive spirometer when instructed by your caregiver.  RISKS AND COMPLICATIONS  Take your time so you do not get dizzy or light-headed.  If you are in pain, you may need to take or ask for pain medication before doing incentive spirometry. It is harder to take a deep breath if you are having pain. AFTER USE  Rest and breathe slowly and easily.  It can be helpful to keep track of a log of your progress. Your caregiver can provide you with a simple table to help with this. If you are using the spirometer at home, follow these instructions: [place] IF:   You are having difficultly using the spirometer.  You have trouble using the spirometer as often as instructed.  Your pain medication is not giving enough relief while using the spirometer.  You develop fever of 100.5 F (38.1 C) or higher. SEEK IMMEDIATE MEDICAL CARE IF:   You cough up bloody sputum that had not been present before.  You develop fever of 102 F (38.9 C) or  greater.  You develop worsening pain at or near the incision site. MAKE SURE YOU:   Understand these instructions.  Will watch your condition.  Will  get help right away if you are not doing well or get worse. Document Released: 08/08/2006 Document Revised: 06/20/2011 Document Reviewed: 10/09/2006 [facility] Patient Information 2014 [place], [place].   ________________________________________________________________________

## 2019-06-06 ENCOUNTER — Encounter (HOSPITAL_COMMUNITY)
Admission: RE | Admit: 2019-06-06 | Discharge: 2019-06-06 | Disposition: A | Discharge status: Home / Self Care | Payer: 59 | Source: Ambulatory Visit | Attending: Surgery | Admitting: Surgery

## 2019-06-06 ENCOUNTER — Other Ambulatory Visit: Payer: Self-pay

## 2019-06-06 ENCOUNTER — Encounter (HOSPITAL_COMMUNITY): Payer: Self-pay

## 2019-06-06 DIAGNOSIS — I2510 Atherosclerotic heart disease of native coronary artery without angina pectoris: Secondary | ICD-10-CM | POA: Insufficient documentation

## 2019-06-06 DIAGNOSIS — I10 Essential (primary) hypertension: Secondary | ICD-10-CM | POA: Diagnosis not present

## 2019-06-06 DIAGNOSIS — M1990 Unspecified osteoarthritis, unspecified site: Secondary | ICD-10-CM | POA: Insufficient documentation

## 2019-06-06 DIAGNOSIS — I252 Old myocardial infarction: Secondary | ICD-10-CM | POA: Insufficient documentation

## 2019-06-06 DIAGNOSIS — K635 Polyp of colon: Secondary | ICD-10-CM | POA: Insufficient documentation

## 2019-06-06 DIAGNOSIS — Z01812 Encounter for preprocedural laboratory examination: Secondary | ICD-10-CM | POA: Diagnosis not present

## 2019-06-06 DIAGNOSIS — Z79899 Other long term (current) drug therapy: Secondary | ICD-10-CM | POA: Insufficient documentation

## 2019-06-06 DIAGNOSIS — K219 Gastro-esophageal reflux disease without esophagitis: Secondary | ICD-10-CM | POA: Diagnosis not present

## 2019-06-06 DIAGNOSIS — Z7982 Long term (current) use of aspirin: Secondary | ICD-10-CM | POA: Insufficient documentation

## 2019-06-06 DIAGNOSIS — Z01818 Encounter for other preprocedural examination: Secondary | ICD-10-CM | POA: Diagnosis present

## 2019-06-06 DIAGNOSIS — Z955 Presence of coronary angioplasty implant and graft: Secondary | ICD-10-CM | POA: Insufficient documentation

## 2019-06-06 DIAGNOSIS — Z86711 Personal history of pulmonary embolism: Secondary | ICD-10-CM | POA: Diagnosis not present

## 2019-06-06 HISTORY — DX: Gastro-esophageal reflux disease without esophagitis: K21.9

## 2019-06-06 LAB — CBC WITH DIFFERENTIAL/PLATELET
Abs Immature Granulocytes: 0 10*3/uL (ref 0.00–0.07)
Basophils Absolute: 0 10*3/uL (ref 0.0–0.1)
Basophils Relative: 1 %
Eosinophils Absolute: 0.1 10*3/uL (ref 0.0–0.5)
Eosinophils Relative: 3 %
HCT: 35.1 % — ABNORMAL LOW (ref 36.0–46.0)
Hemoglobin: 10.6 g/dL — ABNORMAL LOW (ref 12.0–15.0)
Immature Granulocytes: 0 %
Lymphocytes Relative: 49 %
Lymphs Abs: 2.3 10*3/uL (ref 0.7–4.0)
MCH: 27.7 pg (ref 26.0–34.0)
MCHC: 30.2 g/dL (ref 30.0–36.0)
MCV: 91.6 fL (ref 80.0–100.0)
Monocytes Absolute: 0.3 10*3/uL (ref 0.1–1.0)
Monocytes Relative: 7 %
Neutro Abs: 1.8 10*3/uL (ref 1.7–7.7)
Neutrophils Relative %: 40 %
Platelets: 326 10*3/uL (ref 150–400)
RBC: 3.83 MIL/uL — ABNORMAL LOW (ref 3.87–5.11)
RDW: 14.6 % (ref 11.5–15.5)
WBC: 4.6 10*3/uL (ref 4.0–10.5)
nRBC: 0 % (ref 0.0–0.2)

## 2019-06-06 LAB — COMPREHENSIVE METABOLIC PANEL
ALT: 13 U/L (ref 0–44)
AST: 16 U/L (ref 15–41)
Albumin: 3.9 g/dL (ref 3.5–5.0)
Alkaline Phosphatase: 88 U/L (ref 38–126)
Anion gap: 10 (ref 5–15)
BUN: 13 mg/dL (ref 8–23)
CO2: 25 mmol/L (ref 22–32)
Calcium: 9.3 mg/dL (ref 8.9–10.3)
Chloride: 106 mmol/L (ref 98–111)
Creatinine, Ser: 0.82 mg/dL (ref 0.44–1.00)
GFR calc Af Amer: >60 mL/min (ref >60)
GFR calc non Af Amer: >60 mL/min (ref >60)
Glucose, Bld: 106 mg/dL — ABNORMAL HIGH (ref 70–99)
Potassium: 3.7 mmol/L (ref 3.5–5.1)
Sodium: 141 mmol/L (ref 135–145)
Total Bilirubin: 0.5 mg/dL (ref 0.3–1.2)
Total Protein: 7.3 g/dL (ref 6.5–8.1)

## 2019-06-06 LAB — PROTIME-INR
INR: 1 (ref 0.8–1.2)
Prothrombin Time: 13.3 seconds (ref 11.4–15.2)

## 2019-06-06 LAB — APTT: aPTT: 35 seconds (ref 24–36)

## 2019-06-06 LAB — HEMOGLOBIN A1C
Hgb A1c MFr Bld: 6.2 % — ABNORMAL HIGH (ref 4.8–5.6)
Mean Plasma Glucose: 131.24 mg/dL

## 2019-06-06 LAB — ABO/RH: ABO/RH(D): O POS

## 2019-06-06 NOTE — Progress Notes (Signed)
PCP - Dr. Revonda Humphrey Cardiologist - Dr. Adora Fridge  Chest x-ray - 2019 EKG - 04/25/19 Stress Test - no ECHO - no Cardiac Cath - 2018  Sleep Study - no CPAP -   Fasting Blood Sugar - NA Checks Blood Sugar _____ times a day  Blood Thinner Instructions:ASA Aspirin Instructions:continue to take Last Dose:NA  Anesthesia review:   Patient denies shortness of breath, fever, cough and chest pain at PAT appointment yes  Patient verbalized understanding of instructions that were given to them at the PAT appointment. Patient was also instructed that they will need to review over the PAT instructions again at home before surgery. yes

## 2019-06-07 NOTE — Progress Notes (Signed)
Anesthesia Chart Review   Case: [id number] Date/Time: 06/13/19 1230   Procedures:      XI ROBOT ASSISTED SIGMOIDECTOMY (N/A )     FLEXIBLE SIGMOIDOSCOPY (N/A )   Anesthesia type: General   Pre-op diagnosis: COLON POLYP WITH DYSPLASIA, POSSIBLE COLON CANCER   Location: [facility] ROOM 02 / [facility]   Surgeons: Ileana Roup, MD      DISCUSSION:73 y.o. never smoker with h/o HTN, GERD, CAD (MI 2018 s/p DES to LAD), colon polyp with dysplasia, possible colon cancer scheduled for above procedure 06/13/19 with Dr. Nadeen Landau.   Pt last seen by cardiologist, Dr. Quay Burow, 05/08/19.  Per OV note, "History of CAD status post proximal LAD stenting by Dr. Burt Knack 06/22/2016 in the setting of a non-STEMI.  A 4 mm Synergy stent was deployed and postdilated to 4.5 mm.  She did have residual disease in her circumflex however this was a tortuous vessel and medical therapy was recommended.  She had normal LV function.  She was on aspirin Plavix until recently when she developed GI bleed.  She did have pulmonary embolus and was placed on Xarelto which is since stopped as well as her Plavix.  She denies chest pain or shortness of breath.  She does need colon resection and she can be cleared at low risk for this."  Anticipate pt can proceed with planned procedure barring acute status change.   VS: BP (!) 155/96   Pulse 90   Temp 37 C (Oral)   Resp 16   Ht 5\' 6"  (1.676 m)   Wt 95.3 kg   SpO2 100%   BMI 33.89 kg/m   PROVIDERS: Nolene Ebbs, MD is PCP   Quay Burow, MD is Cardiologist  LABS: Labs reviewed: Acceptable for surgery. (all labs ordered are listed, but only abnormal results are displayed)  Labs Reviewed  CBC WITH DIFFERENTIAL/PLATELET - Abnormal; Notable for the following components:      Result Value   RBC 3.83 (*)    Hemoglobin 10.6 (*)    HCT 35.1 (*)    All other components within normal limits  COMPREHENSIVE METABOLIC PANEL - Abnormal; Notable for the following  components:   Glucose, Bld 106 (*)    All other components within normal limits  HEMOGLOBIN A1C - Abnormal; Notable for the following components:   Hgb A1c MFr Bld 6.2 (*)    All other components within normal limits  APTT  PROTIME-INR  TYPE AND SCREEN  ABO/RH     IMAGES:   EKG: 04/25/19 Rate 70 bpm  Sinus rhythm Normal ECG No significant change since last tracing  CV: Cardiac Cath 06/23/2016 1. Severe 2 vessel coronary artery disease with critical stenosis of the proximal LAD and severe stenosis of the mid circumflex 2. Successful PCI to proximal LAD using a 4.0 mm Synergy DES postdilated with a 4.5 mm noncompliant balloon 3. Patent RCA with nonobstructive disease of line 4. Normal LV systolic function  Recommendations:  Dual antiplatelet therapy with aspirin and brilinta for a minimum of 1 year  Medical therapy for her residual CAD  The left circumflex is severely diseased, but there is severe angulation proximal to the lesion. It might be best to treat her medically and if refractory angina occurs she could be considered for PCI of the circumflex.  Echo 01/18/2010 Study Conclusions   - Procedure narrative: Transthoracic echocardiography. Image quality   was poor. The study was technically difficult, as a result of poor   sound  wave transmission.  - Left ventricle: The cavity size was normal. Systolic function was   normal. The estimated ejection fraction was in the range of 55% to   60%. Regional wall motion abnormalities cannot be excluded.   Doppler parameters are consistent with abnormal left ventricular   relaxation (grade 1 diastolic dysfunction).   Past Medical History:  Diagnosis Date  . Arthritis    "right arm" (06/22/2016)  . Cancer ([place]) 06/2019  . Carpal tunnel syndrome   . GERD (gastroesophageal reflux disease)   . Hypertension   . Myocardial infarction ([place]) 2018  . Plantar fasciitis, bilateral    "had shots in them" (06/22/2016)   . Pneumonia 04/10/2019  . Seizures ([place])    "when I was young" (06/22/2016)  . Wears glasses     Past Surgical History:  Procedure Laterality Date  . BIOPSY  04/27/2019   Procedure: BIOPSY;  Surgeon: [name], MD;  Location: [facility] ENDOSCOPY;  Service: Gastroenterology;;  . CARDIAC CATHETERIZATION  2018  . CARPAL TUNNEL RELEASE Left 03/10/2016   Procedure: CARPAL TUNNEL RELEASE, left;  Surgeon: Daryll Brod, MD;  Location: [place];  Service: Orthopedics;  Laterality: Left;  . COLONOSCOPY    . COLONOSCOPY WITH PROPOFOL N/A 04/27/2019   Procedure: COLONOSCOPY WITH PROPOFOL;  Surgeon: [name], MD;  Location: [place];  Service: Gastroenterology;  Laterality: N/A;  . CORONARY STENT INTERVENTION N/A 06/23/2016   Procedure: Coronary Stent Intervention;  Surgeon: Sherren Mocha, MD;  Location: [place] CV LAB;  Service: Cardiovascular;  Laterality: N/A;  . DILATION AND CURETTAGE OF UTERUS    . ESOPHAGOGASTRODUODENOSCOPY (EGD) WITH PROPOFOL N/A 04/27/2019   Procedure: ESOPHAGOGASTRODUODENOSCOPY (EGD) WITH PROPOFOL;  Surgeon: [name], MD;  Location: [place];  Service: Gastroenterology;  Laterality: N/A;  . FRACTURE SURGERY    . LEFT HEART CATH AND CORONARY ANGIOGRAPHY N/A 06/23/2016   Procedure: Left Heart Cath and Coronary Angiography;  Surgeon: Sherren Mocha, MD;  Location: [place] CV LAB;  Service: Cardiovascular;  Laterality: N/A;  . ORIF WRIST FRACTURE  10/11   left  . SUBMUCOSAL TATTOO INJECTION  04/27/2019   Procedure: SUBMUCOSAL TATTOO INJECTION;  Surgeon: [name], MD;  Location: [facility] ENDOSCOPY;  Service: Gastroenterology;;  . TRIGGER FINGER RELEASE Right 02/27/2013   Procedure: RELEASE TRIGGER RIGHT MIDDLE FINGER ;  Surgeon: Wynonia Sours, MD;  Location: [place];  Service: Orthopedics;  Laterality: Right;  . ULNAR NERVE TRANSPOSITION Left 03/10/2016   Procedure: ULNAR NERVE DECOMPRESSION possible TRANSPOSITION;  Surgeon:  Daryll Brod, MD;  Location: [place];  Service: Orthopedics;  Laterality: Left;  [name] VAGINAL HYSTERECTOMY     "took qthing except 1 ovary"    MEDICATIONS: . ascorbic acid (VITAMIN C) 500 MG tablet  . aspirin 81 MG chewable tablet  . atorvastatin (LIPITOR) 80 MG tablet  . furosemide (LASIX) 20 MG tablet  . losartan (COZAAR) 50 MG tablet  . nitroGLYCERIN (NITROSTAT) 0.4 MG SL tablet  . pantoprazole (PROTONIX) 40 MG tablet  . Vitamin D3 (VITAMIN D) 25 MCG tablet  . zinc sulfate 220 (50 Zn) MG capsule   No current facility-administered medications for this encounter.    Maia Plan [facility] Pre-Surgical Testing [phone] 06/07/19  3:09 PM

## 2019-06-10 ENCOUNTER — Other Ambulatory Visit (HOSPITAL_COMMUNITY): Payer: 59

## 2019-06-10 DIAGNOSIS — C801 Malignant (primary) neoplasm, unspecified: Secondary | ICD-10-CM

## 2019-06-10 HISTORY — DX: Malignant (primary) neoplasm, unspecified: C80.1

## 2019-06-12 MED ORDER — BUPIVACAINE LIPOSOME 1.3 % IJ SUSP
20.0000 mL | Freq: Once | INTRAMUSCULAR | Status: DC
  Filled 2019-06-12 (×2): qty 20

## 2019-06-13 ENCOUNTER — Other Ambulatory Visit: Payer: Self-pay

## 2019-06-13 ENCOUNTER — Encounter (HOSPITAL_COMMUNITY)
Admission: RE | Disposition: A | Discharge status: Home / Self Care | Payer: Self-pay | Source: Home / Self Care | Attending: Surgery

## 2019-06-13 ENCOUNTER — Inpatient Hospital Stay (HOSPITAL_COMMUNITY)
Admission: RE | Admit: 2019-06-13 | Discharge: 2019-06-16 | DRG: 330 | Disposition: A | Discharge status: Home / Self Care | Payer: 59 | Attending: Surgery | Admitting: Surgery

## 2019-06-13 ENCOUNTER — Encounter (HOSPITAL_COMMUNITY): Payer: Self-pay | Admitting: Surgery

## 2019-06-13 ENCOUNTER — Inpatient Hospital Stay (HOSPITAL_COMMUNITY): Payer: 59 | Admitting: Physician Assistant

## 2019-06-13 ENCOUNTER — Inpatient Hospital Stay (HOSPITAL_COMMUNITY): Payer: 59 | Admitting: Certified Registered Nurse Anesthetist

## 2019-06-13 DIAGNOSIS — M1909 Primary osteoarthritis, other specified site: Secondary | ICD-10-CM | POA: Diagnosis present

## 2019-06-13 DIAGNOSIS — K219 Gastro-esophageal reflux disease without esophagitis: Secondary | ICD-10-CM | POA: Diagnosis present

## 2019-06-13 DIAGNOSIS — Z8616 Personal history of COVID-19: Secondary | ICD-10-CM | POA: Diagnosis not present

## 2019-06-13 DIAGNOSIS — K660 Peritoneal adhesions (postprocedural) (postinfection): Secondary | ICD-10-CM | POA: Diagnosis present

## 2019-06-13 DIAGNOSIS — D125 Benign neoplasm of sigmoid colon: Secondary | ICD-10-CM | POA: Diagnosis present

## 2019-06-13 DIAGNOSIS — I110 Hypertensive heart disease with heart failure: Secondary | ICD-10-CM | POA: Diagnosis present

## 2019-06-13 DIAGNOSIS — I252 Old myocardial infarction: Secondary | ICD-10-CM

## 2019-06-13 DIAGNOSIS — Z8701 Personal history of pneumonia (recurrent): Secondary | ICD-10-CM

## 2019-06-13 DIAGNOSIS — I5032 Chronic diastolic (congestive) heart failure: Secondary | ICD-10-CM | POA: Diagnosis present

## 2019-06-13 DIAGNOSIS — E669 Obesity, unspecified: Secondary | ICD-10-CM | POA: Diagnosis present

## 2019-06-13 DIAGNOSIS — Z86711 Personal history of pulmonary embolism: Secondary | ICD-10-CM

## 2019-06-13 DIAGNOSIS — Z9049 Acquired absence of other specified parts of digestive tract: Secondary | ICD-10-CM

## 2019-06-13 DIAGNOSIS — Z955 Presence of coronary angioplasty implant and graft: Secondary | ICD-10-CM | POA: Diagnosis not present

## 2019-06-13 DIAGNOSIS — Z90710 Acquired absence of both cervix and uterus: Secondary | ICD-10-CM

## 2019-06-13 DIAGNOSIS — Z8249 Family history of ischemic heart disease and other diseases of the circulatory system: Secondary | ICD-10-CM

## 2019-06-13 DIAGNOSIS — Z6833 Body mass index (BMI) 33.0-33.9, adult: Secondary | ICD-10-CM

## 2019-06-13 DIAGNOSIS — I2510 Atherosclerotic heart disease of native coronary artery without angina pectoris: Secondary | ICD-10-CM | POA: Diagnosis present

## 2019-06-13 HISTORY — PX: FLEXIBLE SIGMOIDOSCOPY: SHX5431

## 2019-06-13 LAB — TYPE AND SCREEN
ABO/RH(D): O POS
Antibody Screen: NEGATIVE

## 2019-06-13 SURGERY — COLECTOMY, PARTIAL, ROBOT-ASSISTED, LAPAROSCOPIC
Anesthesia: General | Site: Abdomen

## 2019-06-13 MED ORDER — HEPARIN SODIUM (PORCINE) 5000 UNIT/ML IJ SOLN
5000.0000 [IU] | Freq: Three times a day (TID) | INTRAMUSCULAR | Status: DC
  Administered 2019-06-13 – 2019-06-15 (×4): 5000 [IU] via SUBCUTANEOUS
  Filled 2019-06-13 (×4): qty 1

## 2019-06-13 MED ORDER — PHENYLEPHRINE HCL-NACL 10-0.9 MG/250ML-% IV SOLN
INTRAVENOUS | Status: DC | PRN
  Administered 2019-06-13: 50 ug/min via INTRAVENOUS

## 2019-06-13 MED ORDER — LACTATED RINGERS IV SOLN: INTRAVENOUS | Status: DC

## 2019-06-13 MED ORDER — PANTOPRAZOLE SODIUM 40 MG PO TBEC
40.0000 mg | DELAYED_RELEASE_TABLET | Freq: Every day | ORAL | Status: DC
  Administered 2019-06-14 – 2019-06-16 (×3): 40 mg via ORAL
  Filled 2019-06-13 (×3): qty 1

## 2019-06-13 MED ORDER — ALVIMOPAN 12 MG PO CAPS
12.0000 mg | ORAL_CAPSULE | Freq: Two times a day (BID) | ORAL | Status: DC
  Administered 2019-06-14 – 2019-06-15 (×2): 12 mg via ORAL
  Filled 2019-06-13 (×5): qty 1

## 2019-06-13 MED ORDER — IBUPROFEN 200 MG PO TABS: 600.0000 mg | ORAL_TABLET | Freq: Four times a day (QID) | ORAL | Status: DC | PRN

## 2019-06-13 MED ORDER — KETAMINE HCL 10 MG/ML IJ SOLN
INTRAMUSCULAR | Status: DC | PRN
  Administered 2019-06-13: 10 mg via INTRAVENOUS
  Administered 2019-06-13 (×2): 20 mg via INTRAVENOUS

## 2019-06-13 MED ORDER — DEXAMETHASONE SODIUM PHOSPHATE 10 MG/ML IJ SOLN
INTRAMUSCULAR | Status: AC
  Filled 2019-06-13: qty 1

## 2019-06-13 MED ORDER — BISACODYL 5 MG PO TBEC
20.0000 mg | DELAYED_RELEASE_TABLET | Freq: Once | ORAL | Status: AC
  Administered 2019-06-13: 20 mg via ORAL
  Filled 2019-06-13: qty 4

## 2019-06-13 MED ORDER — ACETAMINOPHEN 500 MG PO TABS
1000.0000 mg | ORAL_TABLET | ORAL | Status: AC
  Administered 2019-06-13: 1000 mg via ORAL
  Filled 2019-06-13: qty 2

## 2019-06-13 MED ORDER — OXYCODONE HCL 5 MG PO TABS: 5.0000 mg | ORAL_TABLET | Freq: Once | ORAL | Status: DC | PRN

## 2019-06-13 MED ORDER — FENTANYL CITRATE (PF) 100 MCG/2ML IJ SOLN
INTRAMUSCULAR | Status: DC | PRN
  Administered 2019-06-13: 100 ug via INTRAVENOUS
  Administered 2019-06-13: 50 ug via INTRAVENOUS
  Administered 2019-06-13: 100 ug via INTRAVENOUS
  Administered 2019-06-13: 50 ug via INTRAVENOUS

## 2019-06-13 MED ORDER — FUROSEMIDE 20 MG PO TABS
20.0000 mg | ORAL_TABLET | Freq: Every day | ORAL | Status: DC
  Administered 2019-06-14 – 2019-06-16 (×3): 20 mg via ORAL
  Filled 2019-06-13 (×3): qty 1

## 2019-06-13 MED ORDER — DEXAMETHASONE SODIUM PHOSPHATE 10 MG/ML IJ SOLN
INTRAMUSCULAR | Status: DC | PRN
  Administered 2019-06-13: 10 mg via INTRAVENOUS

## 2019-06-13 MED ORDER — ALUM & MAG HYDROXIDE-SIMETH 200-200-20 MG/5ML PO SUSP: 30.0000 mL | Freq: Four times a day (QID) | ORAL | Status: DC | PRN

## 2019-06-13 MED ORDER — PHENYLEPHRINE HCL (PRESSORS) 10 MG/ML IV SOLN
INTRAVENOUS | Status: AC
  Filled 2019-06-13: qty 1

## 2019-06-13 MED ORDER — ROCURONIUM BROMIDE 10 MG/ML (PF) SYRINGE
PREFILLED_SYRINGE | INTRAVENOUS | Status: DC | PRN
  Administered 2019-06-13: 100 mg via INTRAVENOUS

## 2019-06-13 MED ORDER — ACETAMINOPHEN 500 MG PO TABS
1000.0000 mg | ORAL_TABLET | Freq: Four times a day (QID) | ORAL | Status: DC
  Administered 2019-06-13 – 2019-06-16 (×8): 1000 mg via ORAL
  Filled 2019-06-13 (×9): qty 2

## 2019-06-13 MED ORDER — FENTANYL CITRATE (PF) 100 MCG/2ML IJ SOLN
INTRAMUSCULAR | Status: AC
  Filled 2019-06-13: qty 2

## 2019-06-13 MED ORDER — SODIUM CHLORIDE (PF) 0.9 % IJ SOLN
INTRAMUSCULAR | Status: AC
  Filled 2019-06-13: qty 50

## 2019-06-13 MED ORDER — ROCURONIUM BROMIDE 10 MG/ML (PF) SYRINGE
PREFILLED_SYRINGE | INTRAVENOUS | Status: AC
  Filled 2019-06-13: qty 10

## 2019-06-13 MED ORDER — ALVIMOPAN 12 MG PO CAPS
12.0000 mg | ORAL_CAPSULE | ORAL | Status: AC
  Administered 2019-06-13: 12 mg via ORAL
  Filled 2019-06-13: qty 1

## 2019-06-13 MED ORDER — LOSARTAN POTASSIUM 50 MG PO TABS
50.0000 mg | ORAL_TABLET | Freq: Every day | ORAL | Status: DC
  Administered 2019-06-14 – 2019-06-16 (×3): 50 mg via ORAL
  Filled 2019-06-13 (×3): qty 1

## 2019-06-13 MED ORDER — BUPIVACAINE LIPOSOME 1.3 % IJ SUSP
INTRAMUSCULAR | Status: DC | PRN
  Administered 2019-06-13: 20 mL

## 2019-06-13 MED ORDER — BUPIVACAINE-EPINEPHRINE (PF) 0.25% -1:200000 IJ SOLN
INTRAMUSCULAR | Status: AC
  Filled 2019-06-13: qty 30

## 2019-06-13 MED ORDER — POLYETHYLENE GLYCOL 3350 17 GM/SCOOP PO POWD
1.0000 | Freq: Once | ORAL | Status: AC
  Administered 2019-06-13: 255 g via ORAL
  Filled 2019-06-13: qty 255

## 2019-06-13 MED ORDER — LACTATED RINGERS IR SOLN
Status: DC | PRN
  Administered 2019-06-13: 1000 mL

## 2019-06-13 MED ORDER — ONDANSETRON HCL 4 MG PO TABS: 4.0000 mg | ORAL_TABLET | Freq: Four times a day (QID) | ORAL | Status: DC | PRN

## 2019-06-13 MED ORDER — MIDAZOLAM HCL 2 MG/2ML IJ SOLN
INTRAMUSCULAR | Status: AC
  Filled 2019-06-13: qty 2

## 2019-06-13 MED ORDER — BUPIVACAINE-EPINEPHRINE (PF) 0.25% -1:200000 IJ SOLN
INTRAMUSCULAR | Status: DC | PRN
  Administered 2019-06-13: 30 mL

## 2019-06-13 MED ORDER — DIPHENHYDRAMINE HCL 12.5 MG/5ML PO ELIX: 12.5000 mg | ORAL_SOLUTION | Freq: Four times a day (QID) | ORAL | Status: DC | PRN

## 2019-06-13 MED ORDER — INDOCYANINE GREEN 25 MG IV SOLR
INTRAVENOUS | Status: DC | PRN
  Administered 2019-06-13: 25 mg via INTRAVENOUS

## 2019-06-13 MED ORDER — ATORVASTATIN CALCIUM 40 MG PO TABS
80.0000 mg | ORAL_TABLET | Freq: Every day | ORAL | Status: DC
  Administered 2019-06-13 – 2019-06-15 (×3): 80 mg via ORAL
  Filled 2019-06-13 (×3): qty 2

## 2019-06-13 MED ORDER — TRAMADOL HCL 50 MG PO TABS
50.0000 mg | ORAL_TABLET | Freq: Four times a day (QID) | ORAL | Status: DC | PRN
  Administered 2019-06-13: 50 mg via ORAL
  Filled 2019-06-13: qty 1

## 2019-06-13 MED ORDER — HYDROMORPHONE HCL 1 MG/ML IJ SOLN: 0.2500 mg | INTRAMUSCULAR | Status: DC | PRN

## 2019-06-13 MED ORDER — PROPOFOL 10 MG/ML IV BOLUS
INTRAVENOUS | Status: AC
  Filled 2019-06-13: qty 20

## 2019-06-13 MED ORDER — OXYCODONE HCL 5 MG/5ML PO SOLN: 5.0000 mg | Freq: Once | ORAL | Status: DC | PRN

## 2019-06-13 MED ORDER — LIDOCAINE 2% (20 MG/ML) 5 ML SYRINGE
INTRAMUSCULAR | Status: DC | PRN
  Administered 2019-06-13: 80 mg via INTRAVENOUS

## 2019-06-13 MED ORDER — PROMETHAZINE HCL 25 MG/ML IJ SOLN: 6.2500 mg | INTRAMUSCULAR | Status: DC | PRN

## 2019-06-13 MED ORDER — PROPOFOL 10 MG/ML IV BOLUS
INTRAVENOUS | Status: DC | PRN
  Administered 2019-06-13: 130 mg via INTRAVENOUS

## 2019-06-13 MED ORDER — LIDOCAINE HCL 2 % IJ SOLN
INTRAMUSCULAR | Status: AC
  Filled 2019-06-13: qty 20

## 2019-06-13 MED ORDER — METRONIDAZOLE 500 MG PO TABS
1000.0000 mg | ORAL_TABLET | ORAL | Status: DC
  Filled 2019-06-13: qty 2

## 2019-06-13 MED ORDER — 0.9 % SODIUM CHLORIDE (POUR BTL) OPTIME
TOPICAL | Status: DC | PRN
  Administered 2019-06-13: 2000 mL

## 2019-06-13 MED ORDER — ENSURE SURGERY PO LIQD
237.0000 mL | Freq: Two times a day (BID) | ORAL | Status: DC
  Administered 2019-06-14 – 2019-06-15 (×2): 237 mL via ORAL
  Filled 2019-06-13 (×6): qty 237

## 2019-06-13 MED ORDER — PHENYLEPHRINE 40 MCG/ML (10ML) SYRINGE FOR IV PUSH (FOR BLOOD PRESSURE SUPPORT)
PREFILLED_SYRINGE | INTRAVENOUS | Status: DC | PRN
  Administered 2019-06-13 (×5): 80 ug via INTRAVENOUS

## 2019-06-13 MED ORDER — BUPIVACAINE LIPOSOME 1.3 % IJ SUSP: 20.0000 mL | Freq: Once | INTRAMUSCULAR | Status: DC

## 2019-06-13 MED ORDER — HEPARIN SODIUM (PORCINE) 5000 UNIT/ML IJ SOLN
5000.0000 [IU] | Freq: Once | INTRAMUSCULAR | Status: AC
  Administered 2019-06-13: 5000 [IU] via SUBCUTANEOUS
  Filled 2019-06-13: qty 1

## 2019-06-13 MED ORDER — ONDANSETRON HCL 4 MG/2ML IJ SOLN
INTRAMUSCULAR | Status: DC | PRN
  Administered 2019-06-13: 4 mg via INTRAVENOUS

## 2019-06-13 MED ORDER — HYDROMORPHONE HCL 1 MG/ML IJ SOLN
0.5000 mg | INTRAMUSCULAR | Status: DC | PRN
  Administered 2019-06-13: 0.5 mg via INTRAVENOUS
  Filled 2019-06-13 (×2): qty 0.5

## 2019-06-13 MED ORDER — NITROGLYCERIN 0.4 MG SL SUBL: 0.4000 mg | SUBLINGUAL_TABLET | SUBLINGUAL | Status: DC | PRN

## 2019-06-13 MED ORDER — ONDANSETRON HCL 4 MG/2ML IJ SOLN: 4.0000 mg | Freq: Four times a day (QID) | INTRAMUSCULAR | Status: DC | PRN

## 2019-06-13 MED ORDER — LIDOCAINE 2% (20 MG/ML) 5 ML SYRINGE
INTRAMUSCULAR | Status: AC
  Filled 2019-06-13: qty 5

## 2019-06-13 MED ORDER — CHLORHEXIDINE GLUCONATE CLOTH 2 % EX PADS
6.0000 | MEDICATED_PAD | Freq: Once | CUTANEOUS | Status: AC
  Administered 2019-06-13: 6 via TOPICAL

## 2019-06-13 MED ORDER — ONDANSETRON HCL 4 MG/2ML IJ SOLN
INTRAMUSCULAR | Status: AC
  Filled 2019-06-13: qty 2

## 2019-06-13 MED ORDER — SUGAMMADEX SODIUM 200 MG/2ML IV SOLN
INTRAVENOUS | Status: DC | PRN
  Administered 2019-06-13: 200 mg via INTRAVENOUS

## 2019-06-13 MED ORDER — KETAMINE HCL 10 MG/ML IJ SOLN
INTRAMUSCULAR | Status: AC
  Filled 2019-06-13: qty 1

## 2019-06-13 MED ORDER — MIDAZOLAM HCL 5 MG/5ML IJ SOLN
INTRAMUSCULAR | Status: DC | PRN
  Administered 2019-06-13: 2 mg via INTRAVENOUS

## 2019-06-13 MED ORDER — DIPHENHYDRAMINE HCL 50 MG/ML IJ SOLN: 12.5000 mg | Freq: Four times a day (QID) | INTRAMUSCULAR | Status: DC | PRN

## 2019-06-13 MED ORDER — SODIUM CHLORIDE 0.9 % IV SOLN
2.0000 g | INTRAVENOUS | Status: AC
  Administered 2019-06-13: 2 g via INTRAVENOUS
  Filled 2019-06-13: qty 2

## 2019-06-13 MED ORDER — NEOMYCIN SULFATE 500 MG PO TABS
1000.0000 mg | ORAL_TABLET | ORAL | Status: DC
  Filled 2019-06-13: qty 2

## 2019-06-13 SURGICAL SUPPLY — 109 items
ADH SKN CLS APL DERMABOND .7 (GAUZE/BANDAGES/DRESSINGS) ×1
APPLIER CLIP 5 13 M/L LIGAMAX5 (MISCELLANEOUS)
APPLIER CLIP ROT 10 11.4 M/L (STAPLE)
APR CLP MED LRG 11.4X10 (STAPLE)
APR CLP MED LRG 5 ANG JAW (MISCELLANEOUS)
BLADE EXTENDED COATED 6.5IN (ELECTRODE) ×4 IMPLANT
CANNULA REDUC XI 12-8 STAPL (CANNULA) ×3
CANNULA REDUC XI 12-8MM STAPL (CANNULA) ×1
CANNULA REDUCER 12-8 DVNC XI (CANNULA) ×2 IMPLANT
CELLS DAT CNTRL 66122 CELL SVR (MISCELLANEOUS) IMPLANT
CLIP APPLIE 5 13 M/L LIGAMAX5 (MISCELLANEOUS) IMPLANT
CLIP APPLIE ROT 10 11.4 M/L (STAPLE) IMPLANT
CLIP VESOLOCK LG 6/CT PURPLE (CLIP) IMPLANT
CLIP VESOLOCK MED LG 6/CT (CLIP) IMPLANT
COVER SURGICAL LIGHT HANDLE (MISCELLANEOUS) ×8 IMPLANT
COVER TIP SHEARS 8 DVNC (MISCELLANEOUS) ×2 IMPLANT
COVER TIP SHEARS 8MM DA VINCI (MISCELLANEOUS) ×4
COVER WAND RF STERILE (DRAPES) IMPLANT
DECANTER SPIKE VIAL GLASS SM (MISCELLANEOUS) ×4 IMPLANT
DERMABOND ADVANCED (GAUZE/BANDAGES/DRESSINGS) ×2
DERMABOND ADVANCED .7 DNX12 (GAUZE/BANDAGES/DRESSINGS) ×2 IMPLANT
DEVICE TROCAR PUNCTURE CLOSURE (ENDOMECHANICALS) IMPLANT
DRAIN CHANNEL 19F RND (DRAIN) IMPLANT
DRAPE ARM DVNC X/XI (DISPOSABLE) ×8 IMPLANT
DRAPE CARDIOVASC SPLIT 84X147 (DRAPES) ×4 IMPLANT
DRAPE CARDIOVASC SPLIT 88X140 (DRAPES) ×4 IMPLANT
DRAPE COLUMN DVNC XI (DISPOSABLE) ×2 IMPLANT
DRAPE DA VINCI XI ARM (DISPOSABLE) ×16
DRAPE DA VINCI XI COLUMN (DISPOSABLE) ×4
DRAPE SURG IRRIG POUCH 19X23 (DRAPES) ×4 IMPLANT
DRSG OPSITE POSTOP 4X10 (GAUZE/BANDAGES/DRESSINGS) IMPLANT
DRSG OPSITE POSTOP 4X6 (GAUZE/BANDAGES/DRESSINGS) ×4 IMPLANT
DRSG OPSITE POSTOP 4X8 (GAUZE/BANDAGES/DRESSINGS) IMPLANT
DRSG TEGADERM 2-3/8X2-3/4 SM (GAUZE/BANDAGES/DRESSINGS) ×20 IMPLANT
DRSG TEGADERM 4X4.75 (GAUZE/BANDAGES/DRESSINGS) ×4 IMPLANT
ELECT REM PT RETURN 15FT ADLT (MISCELLANEOUS) ×4 IMPLANT
ENDOLOOP SUT PDS II  0 18 (SUTURE)
ENDOLOOP SUT PDS II 0 18 (SUTURE) IMPLANT
EVACUATOR SILICONE 100CC (DRAIN) IMPLANT
GAUZE SPONGE 2X2 8PLY STRL LF (GAUZE/BANDAGES/DRESSINGS) ×2 IMPLANT
GAUZE SPONGE 4X4 12PLY STRL (GAUZE/BANDAGES/DRESSINGS) IMPLANT
GLOVE BIO SURGEON STRL SZ7.5 (GLOVE) ×12 IMPLANT
GLOVE INDICATOR 8.0 STRL GRN (GLOVE) ×12 IMPLANT
GOWN STRL REUS W/TWL XL LVL3 (GOWN DISPOSABLE) ×20 IMPLANT
GRASPER SUT TROCAR 14GX15 (MISCELLANEOUS) IMPLANT
HOLDER FOLEY CATH W/STRAP (MISCELLANEOUS) ×4 IMPLANT
KIT PROCEDURE DA VINCI SI (MISCELLANEOUS) ×4
KIT PROCEDURE DVNC SI (MISCELLANEOUS) ×2 IMPLANT
KIT TURNOVER KIT A (KITS) IMPLANT
NEEDLE INSUFFLATION 14GA 120MM (NEEDLE) ×4 IMPLANT
PACK COLON (CUSTOM PROCEDURE TRAY) ×4 IMPLANT
PAD POSITIONING PINK XL (MISCELLANEOUS) ×4 IMPLANT
PENCIL SMOKE EVACUATOR (MISCELLANEOUS) IMPLANT
PORT LAP GEL ALEXIS MED 5-9CM (MISCELLANEOUS) ×4 IMPLANT
PROTECTOR NERVE ULNAR (MISCELLANEOUS) ×8 IMPLANT
RELOAD STAPLER 4.3X60 GRN DVNC (STAPLE) ×4 IMPLANT
RTRCTR WOUND ALEXIS 18CM MED (MISCELLANEOUS)
SCISSORS LAP 5X35 DISP (ENDOMECHANICALS) ×4 IMPLANT
SEAL CANN UNIV 5-8 DVNC XI (MISCELLANEOUS) ×8 IMPLANT
SEAL XI 5MM-8MM UNIVERSAL (MISCELLANEOUS) ×16
SEALER VESSEL DA VINCI XI (MISCELLANEOUS) ×4
SEALER VESSEL EXT DVNC XI (MISCELLANEOUS) ×2 IMPLANT
SET IRRIG TUBING LAPAROSCOPIC (IRRIGATION / IRRIGATOR) ×4 IMPLANT
SLEEVE ADV FIXATION 5X100MM (TROCAR) IMPLANT
SOLUTION ELECTROLUBE (MISCELLANEOUS) ×4 IMPLANT
SPONGE GAUZE 2X2 STER 10/PKG (GAUZE/BANDAGES/DRESSINGS) ×2
STAPLER 45 BLU RELOAD XI (STAPLE) IMPLANT
STAPLER 45 BLUE RELOAD XI (STAPLE)
STAPLER 45 GREEN RELOAD XI (STAPLE)
STAPLER 45 GRN RELOAD XI (STAPLE) IMPLANT
STAPLER 60 DA VINCI SURE FORM (STAPLE) ×4
STAPLER 60 SUREFORM DVNC (STAPLE) ×2 IMPLANT
STAPLER CANNULA SEAL DVNC XI (STAPLE) ×2 IMPLANT
STAPLER CANNULA SEAL XI (STAPLE) ×4
STAPLER ECHELON POWER CIR 31 (STAPLE) ×4 IMPLANT
STAPLER RELOAD 4.3X60 GREEN (STAPLE) ×8
STAPLER RELOAD 4.3X60 GRN DVNC (STAPLE) ×4
STAPLER SHEATH (SHEATH)
STAPLER SHEATH ENDOWRIST DVNC (SHEATH) IMPLANT
STOPCOCK 4 WAY LG BORE MALE ST (IV SETS) ×8 IMPLANT
SURGILUBE 2OZ TUBE FLIPTOP (MISCELLANEOUS) ×4 IMPLANT
SUT MNCRL AB 4-0 PS2 18 (SUTURE) ×4 IMPLANT
SUT PDS AB 1 CT1 27 (SUTURE) ×8 IMPLANT
SUT PDS AB 1 TP1 96 (SUTURE) IMPLANT
SUT PROLENE 0 CT 2 (SUTURE) IMPLANT
SUT PROLENE 2 0 KS (SUTURE) ×4 IMPLANT
SUT PROLENE 2 0 SH DA (SUTURE) IMPLANT
SUT SILK 2 0 (SUTURE)
SUT SILK 2 0 SH CR/8 (SUTURE) ×4 IMPLANT
SUT SILK 2-0 18XBRD TIE 12 (SUTURE) IMPLANT
SUT SILK 3 0 (SUTURE) ×4
SUT SILK 3 0 SH CR/8 (SUTURE) ×4 IMPLANT
SUT SILK 3-0 18XBRD TIE 12 (SUTURE) ×2 IMPLANT
SUT V-LOC BARB 180 2/0GR6 GS22 (SUTURE)
SUT VIC AB 3-0 SH 18 (SUTURE) IMPLANT
SUT VIC AB 3-0 SH 27 (SUTURE)
SUT VIC AB 3-0 SH 27XBRD (SUTURE) IMPLANT
SUT VICRYL 0 UR6 27IN ABS (SUTURE) ×4 IMPLANT
SUTURE V-LC BRB 180 2/0GR6GS22 (SUTURE) IMPLANT
SYR 10ML LL (SYRINGE) ×4 IMPLANT
SYS LAPSCP GELPORT 120MM (MISCELLANEOUS)
SYSTEM LAPSCP GELPORT 120MM (MISCELLANEOUS) IMPLANT
TAPE UMBILICAL COTTON 1/8X30 (MISCELLANEOUS) IMPLANT
TOWEL OR NON WOVEN STRL DISP B (DISPOSABLE) ×4 IMPLANT
TRAY FOLEY MTR SLVR 14FR STAT (SET/KITS/TRAYS/PACK) ×4 IMPLANT
TROCAR ADV FIXATION 5X100MM (TROCAR) ×4 IMPLANT
TUBING CONNECTING 10 (TUBING) ×6 IMPLANT
TUBING CONNECTING 10' (TUBING) ×2
TUBING INSUFFLATION 10FT LAP (TUBING) ×4 IMPLANT

## 2019-06-13 NOTE — Transfer of Care (Signed)
Immediate Anesthesia Transfer of Care Note  Patient: Kristina [name]  Procedure(s) Performed: XI ROBOT ASSISTED LOW ANTERIOR RESECTION, LYSIS OF ADHESIONS, BILATERAL TAP BLOCK (N/A Abdomen) FLEXIBLE SIGMOIDOSCOPY (N/A )  Patient Location: PACU  Anesthesia Type:General  Level of Consciousness: awake and drowsy  Airway & Oxygen Therapy: Patient Spontanous Breathing and Patient connected to face mask  Post-op Assessment: Report given to RN and Post -op Vital signs reviewed and stable  Post vital signs: Reviewed and stable  Last Vitals:  Vitals Value Taken Time  BP 159/88 06/13/19 1500  Temp    Pulse 88 06/13/19 1505  Resp 9 06/13/19 1505  SpO2 100 % 06/13/19 1505  Vitals shown include unvalidated device data.  Last Pain:  Vitals:   06/13/19 1007  TempSrc:   PainSc: 0-No pain         Complications: No apparent anesthesia complications

## 2019-06-13 NOTE — Anesthesia Preprocedure Evaluation (Signed)
Anesthesia Evaluation  Patient identified by MRN, date of birth, ID band Patient awake    Reviewed: Allergy & Precautions, NPO status , Patient's Chart, lab work & pertinent test results  Airway Mallampati: II  TM Distance: >3 FB     Dental  (+) Dental Advisory Given   Pulmonary asthma ,  S/p covid PNA   breath sounds clear to auscultation       Cardiovascular hypertension, Pt. on medications + CAD   Rhythm:Regular Rate:Normal     Neuro/Psych Seizures -,   Neuromuscular disease    GI/Hepatic Neg liver ROS, GERD  ,GI bleed    Endo/Other  negative endocrine ROS  Renal/GU negative Renal ROS     Musculoskeletal  (+) Arthritis ,   Abdominal   Peds  Hematology  (+) anemia ,   Anesthesia Other Findings   Reproductive/Obstetrics                             Anesthesia Physical  Anesthesia Plan  ASA: III  Anesthesia Plan: General   Post-op Pain Management:    Induction: Intravenous  PONV Risk Score and Plan: 3 and Ondansetron, Treatment may vary due to age or medical condition, Dexamethasone and Midazolam  Airway Management Planned: Oral ETT  Additional Equipment:   Intra-op Plan:   Post-operative Plan: Extubation in OR  Informed Consent: I have reviewed the patients History and Physical, chart, labs and discussed the procedure including the risks, benefits and alternatives for the proposed anesthesia with the patient or authorized representative who has indicated his/her understanding and acceptance.       Plan Discussed with: CRNA  Anesthesia Plan Comments:         Anesthesia Quick Evaluation

## 2019-06-13 NOTE — Progress Notes (Addendum)
Provided education to patient about basic post-op measures. Incentive spirometry provided with education in addition to cough and deep breath. If possible and can tolerate, advised patient to ambulate. Pain meds provided for pain management  Per MD order patient was started on clear liquid diet which she tolerated well. Diet advance to soft consistency for supper

## 2019-06-13 NOTE — Anesthesia Procedure Notes (Signed)
Procedure Name: Intubation Date/Time: 06/13/2019 11:41 AM Performed by: Gerald Leitz, CRNA Pre-anesthesia Checklist: Patient identified, Patient being monitored, Timeout performed, Emergency Drugs available and Suction available Patient Re-evaluated:Patient Re-evaluated prior to induction Oxygen Delivery Method: Circle system utilized Preoxygenation: Pre-oxygenation with 100% oxygen Induction Type: IV induction Ventilation: Mask ventilation without difficulty Laryngoscope Size: Mac and 3 Grade View: Grade I Tube type: Oral Tube size: 7.0 mm Number of attempts: 1 Airway Equipment and Method: Stylet Placement Confirmation: ETT inserted through vocal cords under direct vision,  positive ETCO2 and breath sounds checked- equal and bilateral Secured at: 21 cm Tube secured with: Tape Dental Injury: Teeth and Oropharynx as per pre-operative assessment

## 2019-06-13 NOTE — H&P (Signed)
CC: Newly diagnosed sigmoid mass - here today for surgery  HPI: Kristina [name] is a very pleasant 74yoF with hx of HTN, CAD (s/p PCI/stent previously on plavix), chronic diastolic CHF, COVID 19 pneumonia admitted 04/10/2019 for this issue - she has recovered. During her treatment of all that, she was found to have a segmental pulmonary embolus and started on anticoagulation. She was readmitted 04/27/2019 with near syncope and orthostatic hypotension. She was worked up for also having had melanotic stool and found to have fungating mass in her sigmoid. She was seen by multiple specialists during her admission including cardiology and pulmonology. Her blood thinners were stopped after her CTA chest showed no active embolus. This was done with pulmonary consultation. They also did Dopplers of her lower extremities which did not show any blood clots. Pulmonology felt that it would be best if her surgery was delayed until she was at least a month out from her Covid recovery prior to proceeding with surgery due to the inflammatory milieu being present for at least a month and increasing her risk of venous thromboembolism with surgery. Given that her bleeding stopped once her anticoagulation was held, she was deemed stable for discharent home. Cardiology has seen her as well and noted that before surgery, she would be an intermediate risk but had no plans for any additional optimization that she would benefit from preoperatively. They also cleared her for coming off her Plavix.  She had a colonoscopy with Dr. [name] 04/27/2019. She was found to have a frond-like villous fungating and nonobstructing mass the distal sigmoid colon that was non-circumferential. It measured 3-4 cm in size and was noted to be 25 cm from the dentate line. Using this present. Biopsies were taken. The area distal to the mass was tattooed with spot. Biopsies returned as a tubular adenoma with at least high-grade dysplasia. Given  its endoscopic appearance it was also not thought to be amenable to endoscopic removal. She underwent staging CT chest/had an/pelvis which demonstrated the previously noted segmental polar emboli to have resolved. New scattered opacities in the lower lobes were identified consistent with her known history of COVID-19 pneumonia. The CT abdomen/pelvis showed eccentric thickening in the mid sigmoid which correlates to the endoscopic findings. No evidence of metastatic adenopathy or metastasis to the liver or elsewhere. CEA was 3.0. Since discharge from the hospital, she has been on protein shakes taking in now 2-3 times a day. This is in addition to eating 3 meals a day. She denies any issues with fevers/chills/nausea/vomiting. She is having regular bowel movements. She denies any blood in her stool. She denies any abdominal pain.  INTERVAL HX She denies any changes in her health or health history. She has been off anticoagulation for weeks and also off plavix for same interval. She denies any issues with abdominal pain, n/v nor blood in stool since coming off these medications. She denies fever/chills/cp/sob/cough. She states she is ready for surgery.  PMH: HTN (well controlled on oral antihypertensive), CAD (s/p PCI/stent previously on plavix), chronic diastolic CHF (well compensated on lasix), recent COVID 19 pneumonia (now resolved from symptom standpoint)  PSH: Partial hysterectomy; laparoscopic assisted? Has Pfannenstiel scar  FHx: Denies FHx of malignancy  Social: Denies use of tobacco/EtOH/drugs. 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  Past Medical History:  Diagnosis Date  .  Arthritis    "right arm" (06/22/2016)  . Cancer ([place]) 06/2019  . Carpal tunnel syndrome   . GERD (gastroesophageal reflux disease)   .  Hypertension   . Myocardial infarction ([place]) 2018  . Plantar fasciitis, bilateral    "had shots in them" (06/22/2016)  . Pneumonia 04/10/2019  . Seizures ([place])    "when I was young" (06/22/2016)  . Wears glasses     Past Surgical History:  Procedure Laterality Date  . BIOPSY  04/27/2019   Procedure: BIOPSY;  Surgeon: [name], MD;  Location[facility] ENDOSCOPY;  Service: Gastroenterology;;  . CARDIAC CATHETERIZATION  2018  . CARPAL TUNNEL RELEASE Left 03/10/2016   Procedure: CARPAL TUNNEL RELEASE, left;  Surgeon: Daryll Brod, MD;  Location: [place];  Service: Orthopedics;  Laterality: Left;  . COLONOSCOPY    . COLONOSCOPY WITH PROPOFOL N/A 04/27/2019   Procedure: COLONOSCOPY WITH PROPOFOL;  Surgeon: [name], MD;  Location: [place];  Service: Gastroenterology;  Laterality: N/A;  . CORONARY STENT INTERVENTION N/A 06/23/2016   Procedure: Coronary Stent Intervention;  Surgeon: Sherren Mocha, MD;  Location: [place] CV LAB;  Service: Cardiovascular;  Laterality: N/A;  . DILATION AND CURETTAGE OF UTERUS    . ESOPHAGOGASTRODUODENOSCOPY (EGD) WITH PROPOFOL N/A 04/27/2019   Procedure: ESOPHAGOGASTRODUODENOSCOPY (EGD) WITH PROPOFOL;  Surgeon: [name], MD;  Location: [place];  Service: Gastroenterology;  Laterality: N/A;  . FRACTURE SURGERY    . LEFT HEART CATH AND CORONARY ANGIOGRAPHY N/A 06/23/2016   Procedure: Left Heart Cath and Coronary Angiography;  Surgeon: Sherren Mocha, MD;  Location: [place] CV LAB;  Service: Cardiovascular;  Laterality: N/A;  . ORIF WRIST FRACTURE  10/11   left  . SUBMUCOSAL TATTOO INJECTION  04/27/2019   Procedure: SUBMUCOSAL TATTOO INJECTION;  Surgeon: [name], MD;  Location: [facility] ENDOSCOPY;  Service: Gastroenterology;;  . TRIGGER FINGER RELEASE Right 02/27/2013   Procedure: RELEASE TRIGGER RIGHT MIDDLE FINGER ;  Surgeon: Wynonia Sours, MD;  Location: [place];  Service: Orthopedics;  Laterality: Right;  .  ULNAR NERVE TRANSPOSITION Left 03/10/2016   Procedure: ULNAR NERVE DECOMPRESSION possible TRANSPOSITION;  Surgeon: Daryll Brod, MD;  Location: [place];  Service: Orthopedics;  Laterality: Left;  [name] VAGINAL HYSTERECTOMY     "took qthing except 1 ovary"    Family History  Problem Relation Age of Onset  . Stroke Mother 59  . Heart attack Mother 40  . Lung cancer Father   . Lung cancer Brother   . Prostate cancer Brother   . Colon polyps Neg Hx   . Colon cancer Neg Hx     Social:  reports that she has never smoked. She has never used smokeless tobacco. She reports current alcohol use of about 6.0 standard drinks of alcohol per week. She reports that she does not use drugs.  Allergies: No Known Allergies  Medications: I have reviewed the patient's current medications.  No results found for this or any previous visit (from the past 48 hour(s)).  No results found.  ROS - all of the below systems have been reviewed with the patient and positives are indicated with bold text General: chills, fever or night sweats Eyes: blurry vision or double vision ENT: epistaxis or sore throat Allergy/Immunology: itchy/watery eyes or nasal congestion Hematologic/Lymphatic: bleeding problems, blood clots or swollen lymph nodes Endocrine: temperature intolerance or unexpected weight changes Breast: new or changing breast lumps or nipple discharge Resp: cough, shortness of  breath, or wheezing CV: chest pain or dyspnea on exertion GI: as per HPI GU: dysuria, trouble voiding, or hematuria MSK: joint pain or joint stiffness Neuro: TIA or stroke symptoms Derm: pruritus and skin lesion changes Psych: anxiety and depression  PE Blood pressure (!) 144/82, pulse 91, temperature 98.5 F (36.9 C), temperature source Oral, resp. rate 13, weight 95.3 kg, SpO2 100 %. Constitutional: NAD; conversant; no deformities Eyes: Moist conjunctiva; no lid lag; anicteric; PERRL Neck: Trachea midline;  no thyromegaly Lungs: Normal respiratory effort; no tactile fremitus CV: RRR; no palpable thrills; no pitting edema GI: Abd soft, NT/ND; no palpable hepatosplenomegaly MSK: Normal range of motion of extremities; no clubbing/cyanosis Psychiatric: Appropriate affect; alert and oriented x3 Lymphatic: No palpable cervical or axillary lymphadenopathy  No results found for this or any previous visit (from the past 48 hour(s)).  No results found.   A/P: Kristina [name] is a very pleasant 33yoF with hx of HTN, CAD (s/p PCI/stent previously on plavix), chronic diastolic CHF, recent COVID 58 pneumonia - recently discharged after being found to have mass in mid sigmoid that was source of melena on anticoagulation. Cleared to come off anticoagulation and plavix by pulm and cardiology. Mass bx showed TA with at least HGD; given endoscopic appearance, not amenable to endoscopic removal and favored to be a primary colon cancer with sampling missing an active area of invasive adenoCA  -The anatomy and physiology of the GI tract was discussed at length with the patient. The pathophysiology of colon polyps and cancer was discussed at length with associated pictures. -Given all the above, we discussed proceeding with robotic sigmoidectomy, flexible sigmoidoscopy. We reviewed minimally invasive and potential open techniques to the surgery as well. We discussed that there is a high probability that this polyp actually is harboring cancer given its appearance and findings of at least high-grade dysplasia. -The planned procedure, material risks (including, but not limited to, pain, bleeding, infection, scarring, need for blood transfusion, damage to surrounding structures- blood vessels/nerves/viscus/organs, damage to ureter, leak from anastomosis, need for additional procedures, need for stoma which may be permanent, hernia, DVT/PE, recurrence of cancer, pneumonia, heart attack, stroke, death) benefits and alternatives to  surgery were discussed at length. Without surgery, this will progress and ultimately may take her life. We also spent time highlighting the postoperative management and case scenarios where adjuvant/postop chemotherapy may be recommended. The patient's questions were answered to her satisfaction, she voiced understanding and elected to proceed with surgery. Additionally, we discussed typical postoperative expectations and the recovery process. -Cardiac clearance obtained - low risk per Dr. Gwenlyn Found her cardiologist -Pulmonary clearance obtained  Sharon Mt. Dema Severin, M.D. [facility] Use [email or web address] to contact on call provider

## 2019-06-13 NOTE — Anesthesia Postprocedure Evaluation (Signed)
Anesthesia Post Note  Patient: MONICKA ASLESON  Procedure(s) Performed: XI ROBOT ASSISTED LOW ANTERIOR RESECTION, LYSIS OF ADHESIONS, BILATERAL TAP BLOCK (N/A Abdomen) FLEXIBLE SIGMOIDOSCOPY (N/A )     Patient location during evaluation: PACU Anesthesia Type: General Level of consciousness: awake and alert Pain management: pain level controlled Vital Signs Assessment: post-procedure vital signs reviewed and stable Respiratory status: spontaneous breathing, nonlabored ventilation and respiratory function stable Cardiovascular status: blood pressure returned to baseline and stable Postop Assessment: no apparent nausea or vomiting Anesthetic complications: no    Last Vitals:  Vitals:   06/13/19 1545 06/13/19 1601  BP: (!) 145/88 (!) 148/94  Pulse: 88 83  Resp: 15 18  Temp: (!) 36.4 C (!) 36.4 C  SpO2: 98% 100%    Last Pain:  Vitals:   06/13/19 1601  TempSrc: Oral  PainSc:                  Lynda Rainwater

## 2019-06-13 NOTE — Op Note (Addendum)
PATIENT: Kristina [name]  74 y.o. female  Patient Care Team: Nolene Ebbs, MD as PCP - General (Internal Medicine) [name], MD as PCP - Cardiology (Cardiology)  PREOP DIAGNOSIS: COLON POLYP WITH DYSPLASIA, POSSIBLE COLON CANCER  POSTOP DIAGNOSIS: COLON POLYP WITH DYSPLASIA, POSSIBLE COLON CANCER  PROCEDURE:  1.  Robotic low anterior resection 2.  Robotic assisted lysis of adhesions x 60 minutes 3.  Flexible sigmoidoscopy 4.  Bilateral transversus abdominis plane blocks  SURGEON: Sharon Mt. Dema Severin, MD  ASSISTANT: Michael Boston, MD  ANESTHESIA: General endotracheal  EBL: 50 mL Total I/O In: 1700 [I.V.:1600; IV Piggyback:100] Out: 50 [Blood:50]  DRAINS: None  SPECIMEN:  1. Rectosigmoid colon (open end proximal) 2.  Distal donut-final distal margin  COUNTS: Sponge, needle and instrument counts were reported correct x2  FINDINGS: Significant adhesions in the pelvis between a redundant loop of sigmoid colon, sigmoid mesentery, small bowel, and cecum.  This is all likely due to her prior remote surgical history-hysterectomy.  Tattoo identified in the mid sigmoid colon with intraluminal mass just proximal to it.  Low anterior resection carried out with double stapled colorectal anastomosis.  The anastomosis rests 13 cm from the anal verge by flexible sigmoidoscopy.  STATEMENT OF MEDICAL NECESSITY: SYDELLE CERVONI is a 74 y.o. female with history of hypertension, CAD (status post PCI/stent previously on Plavix), chronic diastolic heart failure, [id number] pneumonia and was admitted 04/10/2019 with severe Covid 19 infection.  She was found to have segmental pulmonary emboli and started on anticoagulation.  She was readmitted 04/27/2019 with near syncope and orthostatic hypotension.  She was found to have GI bleed and underwent work-up for this.  She was found to have a fungating mass in her sigmoid colon.  She underwent evaluation with cardiology and pulmonology.  They had  recommended she wait at least 1 month from her COVID-19 infection given the severity of her illness before proceeding with any sort of surgery.  Her anticoagulation was discontinued by her cardiologist and pulmonologist.  She did have a repeat CTA chest which showed no evidence of active pulmonary emboli.  She also had lower extremity Dopplers that were negative.  With him in the office and discussed everything moving forward.  She opted to pursue surgery.  Please refer to notes elsewhere for details regarding this discussion  NARRATIVE: Informed consent was verified. The patient was taken to the operating room, placed supine on the operating table and SCD's were applied. General endotracheal anesthesia was induced. She was then positioned in the lithotomy position with Allen stirrups.  Pressure points were then evaluated and padded. The patient's abdomen was then prepped and draped in the standard sterile fashion. Surgical timeout confirmed our plan.   An OG tube had been placed by anesthesia and was confirmed to be to suction.  At Palmer's point, a Veress needle was introduced into the abdomen and insufflation commenced to maximum pressure of 15 mmHg with CO2.  After pneumoperitoneum had been established, just off the midline in the right upper abdomen, an 8 mm blunt tipped robotic trocar was carefully placed.  Laparoscope was inserted and demonstrated no evidence of trocar site or Veress needle site complications.  Bilateral transversus abdominis plane blocks were then created using dilute mixture of Exparel with Marcaine.  3 additional 8 mm trochars were placed in a line extending from the left upper quadrant to the right ASIS.  A 12 mm robotic trocar was placed well above the bladder and the suprapubic midline.  A  5 mm assist port was placed in the right lateral abdominal wall.  She was positioned in Trendelenburg with gentle right side down.  The small bowel was swept from the pelvis.  There were  adhesions between the small bowel and the sigmoid as well as the sigmoid mesentery.  The tattoo was identified in the the mid sigmoid colon.  The robot was docked.  I then went to the console.  Using scissors, sharp adhesiolysis was carried out placing all adhesions in her pelvis which are likely from her prior hysterectomy.  There were significant adhesions between a redundant loop of sigmoid colon, sigmoid mesentery, small bowel, and cecum. This took 60 minutes to complete. The cecum, small bowel and sigmoid were evaluated and there was no evidence of deserosalization or injury.  After these adhesions have been freed, sigmoid was evaluated.  The tattoo was confirmed.  The mass was noted to be just proximal to the tattoo in the mid sigmoid colon.  A lateral to medial approach was utilized.  The descending colon was mobilized up to the splenic flexure along the Celestine Prim line of Toldt. The descending colon was able to be reflected medially to the midline.  Attention was then turned to sigmoid colon mobilization.  The sigmoid colon was mobilized off the intersigmoid fossa and care was taken to protect the left ureter from injury.  The ureter was identified and left in place along with the remnant gonadal vessels - posterior to the mesentery.  The sigmoid mesentery was mobilized. The sigmoid colon was then elevated. The peritoneum overlying the IMA was scored and the peritoneum opened down to the proximal rectum. The IMA was isolated and location of the left ureter confirmed to be down and posterior to where the plane of dissection was located. The IMA was divided with an EnSeal energy device.  The IMA stump was then inspected and noted to be hemostatic with an appropriate appearing seal.  Attention was then turned to the pelvic dissection.  The proximal rectum was identified and an area where the tinea had splayed, there were loss of appendices epiploica, and an area that was just beyond the sacral promontory, the  distal point of transection was selected.  The mesorectum was carefully cleared using the Enseal device.  Attention was then turned the proximal point of transection.  Including the IMA pedicle with the mesentery, the proximal point of transection was identified at the level of the distal descending colon.  The mesentery was then divided using the Enseal device out to this level.   The pelvis was hemostatic as was the cut edge of mesentery. The rectosigmoid colon was divided with a 60 mm robotic stapler using a green load.  Attention was then turned to performing the perfusion test.  ICG was administered.  There was avid uptake of ICG out to the level of the cleared mesentery on the descending colon. The rectum had avid uptake as well.   Attention was then turned to the extracorporeal portion of the procedure.  The robot was undocked.  A Pfannenstiel incision was created at the location of the 12 mm port site.  The rectus fascia was incised, elevated with Kocher clamps, and subfascial flaps raised.  The peritoneum was then incised in the midline under pneumoperitoneum.  An [place] wound protector was placed.  Towels were placed around the field.  The specimen was delivered.  The proximal point of transection was identified in the mesentery been cleared.  A pursestring device was applied  and a 2-0 Prolene on a Keith needle passed through.  This was then divided sharply and passed off the specimen with the open end being proximal.  Out to the back table and confirmed that the dysplastic polyp had been completely removed and within the specimen it was.  I changed gown and gloves.  The pursestring clamp was released and EEA sizers passed.  A 31 mm EEA was selected.  Belt loops of 3-0 silk suture were then placed around the pursestring suture line.  The anvil was placed in the pursestring tied.  There was a small amount of fat within the planned anastomosis that was cleared.  There were no diverticula.  The anvil was  placed back in the abdomen and a cap placed over the wound protector.  Pneumoperitoneum was reestablished.  I then went below to pass the stapler.  EEA sizers were passed up the rectal stump and the 31 mm EEA stapler passed under direct visualization.  The spike was deployed just anterior to the staple line.  The components were mated. The descending colon easily reached and remained in the pelvis without any tension.  Orientation was confirmed such that there is no twisting of the descending colon.  There is no bowel to the mesentery.  There were no other structures within the planned staple line.  The stapler was then closed, held, fired.  The stapler was removed and the donuts inspected.  Donuts were complete. Pelvis was noted to be hemostatic.  Proximal to the level anastomosis was gently occluded in the pelvis filled with sterile saline.  I passed the flexible sigmoidoscope under direct visualization and was able to visualize the anastomosis which was hemostatic, airtight, and well perfused in appearance.   The distal donut was also passed off as specimen. The colorectal anastomosis rests 13 cm from the anal verge by flexible sigmoidoscopy.  Attention was turned to closing.  All equipment was removed and had been passed off.  We then exchanged all equipment for clean equipment.  Gowns and gloves well adjustments have been changed.  We turned our attention to closure. Irrigation was evacuated. Port sites were inspected and noted be hemostatic.  The midline peritoneum was closed using a 0 Vicryl running suture.  The fascia was reapproximated using 2 running #1 PDS sutures.  Additional local anesthetic was infiltrated into the anus no incision.  The skin of all incision sites was closed with a running 4-0 Monocryl subcuticular suture.  Dermabond was applied to all incision sites.  Honeycomb dressing was placed on the Pfannenstiel incision.  She was then taken out of the lithotomy position, awakened from  anesthesia, extubated, and transferred to a stretcher for transport to PACU in satisfactory condition.  An MD assistant was necessary for tissue manipulation, retraction and positioning due to the complexity of the case, obesity of the patient and hospital policies  DISPOSITION: PACU in satisfactory condition

## 2019-06-13 NOTE — Progress Notes (Signed)
Called patient's son to inform him his mother has gone to surgery. Dr Dema Severin will call him when surgery is finished in about 3 hours. He verbalizes understanding.

## 2019-06-14 LAB — BASIC METABOLIC PANEL
Anion gap: 9 (ref 5–15)
BUN: 14 mg/dL (ref 8–23)
CO2: 25 mmol/L (ref 22–32)
Calcium: 9.4 mg/dL (ref 8.9–10.3)
Chloride: 103 mmol/L (ref 98–111)
Creatinine, Ser: 1.1 mg/dL — ABNORMAL HIGH (ref 0.44–1.00)
GFR calc Af Amer: 58 mL/min — ABNORMAL LOW (ref >60)
GFR calc non Af Amer: 50 mL/min — ABNORMAL LOW (ref >60)
Glucose, Bld: 134 mg/dL — ABNORMAL HIGH (ref 70–99)
Potassium: 4 mmol/L (ref 3.5–5.1)
Sodium: 137 mmol/L (ref 135–145)

## 2019-06-14 LAB — CBC
HCT: 34.3 % — ABNORMAL LOW (ref 36.0–46.0)
Hemoglobin: 10.6 g/dL — ABNORMAL LOW (ref 12.0–15.0)
MCH: 28.2 pg (ref 26.0–34.0)
MCHC: 30.9 g/dL (ref 30.0–36.0)
MCV: 91.2 fL (ref 80.0–100.0)
Platelets: 325 10*3/uL (ref 150–400)
RBC: 3.76 MIL/uL — ABNORMAL LOW (ref 3.87–5.11)
RDW: 14.6 % (ref 11.5–15.5)
WBC: 10.8 10*3/uL — ABNORMAL HIGH (ref 4.0–10.5)
nRBC: 0 % (ref 0.0–0.2)

## 2019-06-14 LAB — GLUCOSE, CAPILLARY
Glucose-Capillary: 109 mg/dL — ABNORMAL HIGH (ref 70–99)
Glucose-Capillary: 139 mg/dL — ABNORMAL HIGH (ref 70–99)
Glucose-Capillary: 116 mg/dL — ABNORMAL HIGH (ref 70–99)

## 2019-06-14 LAB — PHOSPHORUS: Phosphorus: 4.3 mg/dL (ref 2.5–4.6)

## 2019-06-14 LAB — MAGNESIUM: Magnesium: 2.1 mg/dL (ref 1.7–2.4)

## 2019-06-14 MED ORDER — INSULIN ASPART 100 UNIT/ML ~~LOC~~ SOLN: 0.0000 [IU] | Freq: Three times a day (TID) | SUBCUTANEOUS | Status: DC
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
## 2019-06-14 NOTE — Discharge Instructions (Signed)
POST OP INSTRUCTIONS AFTER COLON SURGERY  1. DIET: Be sure to include lots of fluids daily to stay hydrated - 64oz of water per day (8, 8 oz glasses).  Avoid fast food or heavy meals for the first couple of weeks as your are more likely to get nauseated. Avoid raw/uncooked fruits or vegetables for the first 4 weeks (its ok to have these if they are blended into smoothie form). If you have fruits/vegetables, make sure they are cooked until soft enough to mash on the roof of your mouth and chew your food well. Otherwise, diet as tolerated.  2. Take your usually prescribed home medications unless otherwise directed.  3. PAIN CONTROL: a. Pain is best controlled by a usual combination of three different methods TOGETHER: i. Ice/Heat ii. Over the counter pain medication iii. Prescription pain medication b. Most patients will experience some swelling and bruising around the surgical site.  Ice packs or heating pads (30-60 minutes up to 6 times a day) will help. Some people prefer to use ice alone, heat alone, alternating between ice & heat.  Experiment to what works for you.  Swelling and bruising can take several weeks to resolve.   c. It is helpful to take an over-the-counter pain medication regularly for the first few weeks: i. Ibuprofen (Motrin/Advil) - 200mg tabs - take 3 tabs (600mg) every 6 hours as needed for pain (unless you have been directed previously to avoid NSAIDs/ibuprofen) ii. Acetaminophen (Tylenol) - you may take 650mg every 6 hours as needed. You can take this with motrin as they act differently on the body. If you are taking a narcotic pain medication that has acetaminophen in it, do not take over the counter tylenol at the same time. iii. NOTE: You may take both of these medications together - most patients  find it most helpful when alternating between the two (i.e. Ibuprofen at 6am, tylenol at 9am, ibuprofen at 12pm ...) d. A  prescription for pain medication should be given to you  upon discharge.  Take your pain medication as prescribed if your pain is not adequatly controlled with the over-the-counter pain reliefs mentioned above.  4. Avoid getting constipated.  Between the surgery and the pain medications, it is common to experience some constipation.  Increasing fluid intake and taking a fiber supplement (such as Metamucil, Citrucel, FiberCon, MiraLax, etc) 1-2 times a day regularly will usually help prevent this problem from occurring.  A mild laxative (prune juice, Milk of Magnesia, MiraLax, etc) should be taken according to package directions if there are no bowel movements after 48 hours.    5. Dressing: Your incisions are covered in Dermabond which is like sterile superglue for the skin. This will come off on it's own in a couple weeks. It is waterproof and you may bathe normally starting the day after your surgery in a shower. Avoid baths/pools/lakes/oceans until your wounds have fully healed.  6. ACTIVITIES as tolerated:   a. Avoid heavy lifting (>10lbs or 1 gallon of milk) for the next 6 weeks. b. You may resume regular daily activities as tolerated--such as daily self-care, walking, climbing stairs--gradually increasing activities as tolerated.  If you can walk 30 minutes without difficulty, it is safe to try more intense activity such as jogging, treadmill, bicycling, low-impact aerobics.  c. DO NOT PUSH THROUGH PAIN.  Let pain be your guide: If it hurts to do something, don't do it. d. You may drive when you are no longer taking prescription pain medication, you   can comfortably wear a seatbelt, and you can safely maneuver your car and apply brakes.  7. FOLLOW UP in our office a. Please call [facility] at [phone] to set up an appointment to see your surgeon in the office for a follow-up appointment approximately 2 weeks after your surgery. b. Make sure that you call for this appointment the day you arrive home to insure a convenient appointment time.  9. If you  have disability or family leave forms that need to be completed, you may have them completed by your primary care physician's office; for return to work instructions, please ask our office staff and they will be happy to assist you in obtaining this documentation   When to call us [phone]: 1. Poor pain control 2. Reactions / problems with new medications (rash/itching, etc)  3. Fever over 101.5 F (38.5 C) 4. Inability to urinate 5. Nausea/vomiting 6. Worsening swelling or bruising 7. Continued bleeding from incision. 8. Increased pain, redness, or drainage from the incision  The clinic staff is available to answer your questions during regular business hours (8:30am-5pm).  Please don't hesitate to call and ask to speak to one of our nurses for clinical concerns.   A surgeon from [facility] is always on call at the hospitals   If you have a medical emergency, go to the nearest emergency room or call 911.  [facility] [address], [address], [place], [place]  [address] MAIN: [phone] FAX: [phone] [email or web address] 

## 2019-06-14 NOTE — Progress Notes (Signed)
Occupational Therapy Evaluation Patient Details Name: Kristina [name] MRN: [id number] DOB: 05-31-1945 Today's Date: 06/14/2019    History of Present Illness 38yoF admitted with colon mass found on colonoscopy 04/27/19, s/p lower anterior colon resection 06/13/19    hx of HTN, CAD (s/p PCI/stent previously on plavix), chronic diastolic CHF, COVID 19 pneumonia admitted 04/10/2019 for this issue,  segmental pulmonary embolus.   Clinical Impression   Patient reports living with family in a single story home, who will be able to assist as needed upon d/c. Patient was able to mobilize around room with Modified Independence to retrieve clothing and grooming items. Patient was able to complete grooming task while standing at sink in bathroom. Patient safely completed LB dressing with no LOB episodes with Modified Independence. Patient will be an evaluation only for Occupational Therapy. No further OT services needed.     Follow Up Recommendations  No OT follow up    Equipment Recommendations       Recommendations for Other Services       Precautions / Restrictions Precautions Precautions: Fall Restrictions Weight Bearing Restrictions: No      Mobility Bed Mobility Overal bed mobility: Modified Independent             General bed mobility comments: HOB elevated ~ 30 degrees, incr time, use of rail to roll to right side; no assist to return to supine  Transfers Overall transfer level: Needs assistance   Transfers: Sit to/from Stand Sit to Stand: Modified independent (Device/Increase time)         General transfer comment: no physical assist    Balance Overall balance assessment: Modified Independent   Sitting balance-Leahy Scale: Good       Standing balance-Leahy Scale: Good               High level balance activites: Backward walking;Direction changes;Turns;Head turns High Level Balance Comments: no LOB with above           ADL either performed or assessed  with clinical judgement   ADL Overall ADL's : Modified independent                                       General ADL Comments: requiring extra time only      Vision Baseline Vision/History: No visual deficits       Perception     Praxis      Pertinent Vitals/Pain Pain Assessment: No/denies pain     Hand Dominance Right   Extremity/Trunk Assessment Upper Extremity Assessment Upper Extremity Assessment: Overall WFL for tasks assessed   Lower Extremity Assessment Lower Extremity Assessment: Defer to PT evaluation       Communication Communication Communication: No difficulties   Cognition Arousal/Alertness: Awake/alert Behavior During Therapy: WFL for tasks assessed/performed Overall Cognitive Status: Within Functional Limits for tasks assessed                                     General Comments       Exercises     Shoulder Instructions      Home Living Family/patient expects to be discharged to:: Private residence Living Arrangements: Other relatives Available Help at Discharge: Family;Friend(s);Available PRN/intermittently Type of Home: House Home Access: Level entry     Home Layout: One level     Bathroom Shower/Tub: Tub/shower  unit;Walk-in shower   Bathroom Toilet: Handicapped height Bathroom Accessibility: Yes How Accessible: Accessible via walker Home Equipment: Shower seat;Walker - 2 wheels;Cane - single point;Grab bars - tub/shower          Prior Functioning/Environment Level of Independence: Independent        Comments: works for the [employer] [place] in [employer], drives, very indpependent        OT Problem List:        OT Treatment/Interventions:      OT Goals(Current goals can be found in the care plan section)    OT Frequency:     Barriers to D/C:            Co-evaluation              AM-PAC OT "6 Clicks" Daily Activity     Outcome Measure Help from another person eating  meals?: None Help from another person taking care of personal grooming?: None Help from another person toileting, which includes using toliet, bedpan, or urinal?: None Help from another person bathing (including washing, rinsing, drying)?: None Help from another person to put on and taking off regular upper body clothing?: None Help from another person to put on and taking off regular lower body clothing?: None 6 Click Score: 24   End of Session Nurse Communication: Mobility status  Activity Tolerance: Patient tolerated treatment well Patient left: in bed;with call bell/phone within reach                   Time: 1334-1357 OT Time Calculation (min): 23 min Charges:  OT General Charges $OT Visit: 1 Visit OT Evaluation $OT Eval Low Complexity: 1 Low OT Treatments $Self Care/Home Management : 8-22 mins  Alphia Behanna OTR/L   Burdette Gergely 06/14/2019, 4:50 PM

## 2019-06-14 NOTE — Evaluation (Addendum)
Physical Therapy Evaluation Patient Details Name: PEG SALMINEN MRN: [id number] DOB: [birth year] Today's Date: 06/14/2019   History of Present Illness  8yoF admitted with colon mass found on colonoscopy 04/27/19, s/p lower anterior colon resection 06/13/19    hx of HTN, CAD (s/p PCI/stent previously on plavix), chronic diastolic CHF, COVID 19 pneumonia admitted 04/10/2019 for this issue,  segmental pulmonary embolus.  Clinical Impression  Patient evaluated by Physical Therapy with no further acute PT needs identified. All education has been completed and the patient has no further questions.  Pt doing very well today, no c/o pain, very pleasant and motivated.  amb  ~400' with no device. Feel pt is ok to amb on her own if ok with nursing staff.  See below for any follow-up Physical Therapy or equipment needs. PT is signing off. Thank you for this referral.     Follow Up Recommendations No PT follow up    Equipment Recommendations  None recommended by PT    Recommendations for Other Services       Precautions / Restrictions Precautions Precautions: Fall Restrictions Weight Bearing Restrictions: No      Mobility  Bed Mobility Overal bed mobility: Modified Independent             General bed mobility comments: HOB elevated ~ 30 degrees, incr time, use of rail to roll to right side; no assist to return to supine  Transfers Overall transfer level: Needs assistance   Transfers: Sit to/from Stand Sit to Stand: Modified independent (Device/Increase time);Supervision         General transfer comment: no physical assist, supervision initially for safety  Ambulation/Gait Ambulation/Gait assistance: Supervision;Modified independent (Device/Increase time) Gait Distance (Feet): 400 Feet Assistive device: None Gait Pattern/deviations: Step-through pattern     General Gait Details: very slight drift to R initial 40', no LOB with head turns or  min challenges  Stairs             Wheelchair Mobility    Modified Rankin (Stroke Patients Only)       Balance Overall balance assessment: Needs assistance           Standing balance-Leahy Scale: Good               High level balance activites: Backward walking;Direction changes;Turns;Head turns High Level Balance Comments: no LOB with above             Pertinent Vitals/Pain Pain Assessment: No/denies pain    Home Living Family/patient expects to be discharged to:: Private residence   Available Help at Discharge: Family;Friend(s);Available PRN/intermittently Type of Home: House Home Access: Level entry     Home Layout: One level Home Equipment: Clinical cytogeneticist - 2 wheels;Cane - single point      Prior Function Level of Independence: Independent         Comments: works for the [employer] [place] in [employer], drives, very indpependent     Journalist, newspaper        Extremity/Trunk Assessment   Upper Extremity Assessment Upper Extremity Assessment: Defer to OT evaluation    Lower Extremity Assessment Lower Extremity Assessment: Overall WFL for tasks assessed       Communication   Communication: No difficulties  Cognition Arousal/Alertness: Awake/alert Behavior During Therapy: WFL for tasks assessed/performed Overall Cognitive Status: Within Functional Limits for tasks assessed  General Comments      Exercises     Assessment/Plan    PT Assessment Patent does not need any further PT services  PT Problem List         PT Treatment Interventions      PT Goals (Current goals can be found in the Care Plan section)  Acute Rehab PT Goals PT Goal Formulation: All assessment and education complete, DC therapy    Frequency     Barriers to discharge        Co-evaluation               AM-PAC PT "6 Clicks" Mobility  Outcome Measure Help needed turning from your back to your side while in a  flat bed without using bedrails?: None Help needed moving from lying on your back to sitting on the side of a flat bed without using bedrails?: None Help needed moving to and from a bed to a chair (including a wheelchair)?: None Help needed standing up from a chair using your arms (e.g., wheelchair or bedside chair)?: None Help needed to walk in hospital room?: None Help needed climbing 3-5 steps with a railing? : None 6 Click Score: 24    End of Session   Activity Tolerance: Patient tolerated treatment well Patient left: in bed;with call bell/phone within reach;with bed alarm set   PT Visit Diagnosis: Difficulty in walking, not elsewhere classified (R26.2)    Time: [id number] PT Time Calculation (min) (ACUTE ONLY): 19 min   Charges:   PT Evaluation $PT Eval Low Complexity: 1 Low          Antwan Pandya, PT   Acute Rehab Dept [facility]): [id number]   06/14/2019   [facility] 06/14/2019, 1:45 PM

## 2019-06-15 LAB — CBC
HCT: 32 % — ABNORMAL LOW (ref 36.0–46.0)
Hemoglobin: 9.8 g/dL — ABNORMAL LOW (ref 12.0–15.0)
MCH: 27.8 pg (ref 26.0–34.0)
MCHC: 30.6 g/dL (ref 30.0–36.0)
MCV: 90.7 fL (ref 80.0–100.0)
Platelets: 242 K/uL (ref 150–400)
RBC: 3.53 MIL/uL — ABNORMAL LOW (ref 3.87–5.11)
RDW: 14.6 % (ref 11.5–15.5)
WBC: 7.2 K/uL (ref 4.0–10.5)
nRBC: 0 % (ref 0.0–0.2)

## 2019-06-15 LAB — BASIC METABOLIC PANEL
Anion gap: 6 (ref 5–15)
BUN: 14 mg/dL (ref 8–23)
CO2: 28 mmol/L (ref 22–32)
Calcium: 8.9 mg/dL (ref 8.9–10.3)
Chloride: 108 mmol/L (ref 98–111)
Creatinine, Ser: 0.84 mg/dL (ref 0.44–1.00)
GFR calc Af Amer: >60 mL/min (ref >60)
GFR calc non Af Amer: >60 mL/min (ref >60)
Glucose, Bld: 102 mg/dL — ABNORMAL HIGH (ref 70–99)
Potassium: 3.4 mmol/L — ABNORMAL LOW (ref 3.5–5.1)
Sodium: 142 mmol/L (ref 135–145)

## 2019-06-15 LAB — GLUCOSE, CAPILLARY
Glucose-Capillary: 90 mg/dL (ref 70–99)
Glucose-Capillary: 83 mg/dL (ref 70–99)
Glucose-Capillary: 113 mg/dL — ABNORMAL HIGH (ref 70–99)
Glucose-Capillary: 106 mg/dL — ABNORMAL HIGH (ref 70–99)

## 2019-06-15 MED ORDER — POTASSIUM CHLORIDE CRYS ER 20 MEQ PO TBCR
40.0000 meq | EXTENDED_RELEASE_TABLET | Freq: Once | ORAL | Status: AC
  Administered 2019-06-15: 40 meq via ORAL
  Filled 2019-06-15: qty 2
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
## 2019-06-16 LAB — BASIC METABOLIC PANEL
Anion gap: 10 (ref 5–15)
BUN: 11 mg/dL (ref 8–23)
CO2: 27 mmol/L (ref 22–32)
Calcium: 9 mg/dL (ref 8.9–10.3)
Chloride: 103 mmol/L (ref 98–111)
Creatinine, Ser: 0.7 mg/dL (ref 0.44–1.00)
GFR calc Af Amer: >60 mL/min (ref >60)
GFR calc non Af Amer: >60 mL/min (ref >60)
Glucose, Bld: 101 mg/dL — ABNORMAL HIGH (ref 70–99)
Potassium: 3.3 mmol/L — ABNORMAL LOW (ref 3.5–5.1)
Sodium: 140 mmol/L (ref 135–145)

## 2019-06-16 LAB — CBC
HCT: 32.8 % — ABNORMAL LOW (ref 36.0–46.0)
Hemoglobin: 9.9 g/dL — ABNORMAL LOW (ref 12.0–15.0)
MCH: 27.7 pg (ref 26.0–34.0)
MCHC: 30.2 g/dL (ref 30.0–36.0)
MCV: 91.6 fL (ref 80.0–100.0)
Platelets: 272 10*3/uL (ref 150–400)
RBC: 3.58 MIL/uL — ABNORMAL LOW (ref 3.87–5.11)
RDW: 14.6 % (ref 11.5–15.5)
WBC: 5.5 10*3/uL (ref 4.0–10.5)
nRBC: 0 % (ref 0.0–0.2)

## 2019-06-16 LAB — GLUCOSE, CAPILLARY: Glucose-Capillary: 105 mg/dL — ABNORMAL HIGH (ref 70–99)

## 2019-06-16 MED ORDER — TRAMADOL HCL 50 MG PO TABS: 50.0000 mg | ORAL_TABLET | Freq: Four times a day (QID) | ORAL | 0 refills | Status: AC | PRN
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
## 2019-06-16 NOTE — Progress Notes (Signed)
Pt has had 2 BMs this shift with no blood present. No other complaints. Hortencia Conradi RN

## 2019-06-16 NOTE — Progress Notes (Signed)
Patient was given discharge instructions with no immediate questions or concerns. Patient will be taken downstairs via wheelchair. 

## 2019-06-19 LAB — SURGICAL PATHOLOGY

## 2019-06-24 ENCOUNTER — Other Ambulatory Visit: Payer: Self-pay | Admitting: Internal Medicine

## 2019-06-24 DIAGNOSIS — Z1231 Encounter for screening mammogram for malignant neoplasm of breast: Secondary | ICD-10-CM

## 2019-06-25 ENCOUNTER — Other Ambulatory Visit: Payer: Self-pay

## 2019-06-25 ENCOUNTER — Ambulatory Visit
Admission: RE | Admit: 2019-06-25 | Discharge: 2019-06-25 | Disposition: A | Discharge status: Home / Self Care | Payer: 59 | Source: Ambulatory Visit | Attending: Internal Medicine | Admitting: Internal Medicine

## 2019-06-25 DIAGNOSIS — Z1231 Encounter for screening mammogram for malignant neoplasm of breast: Secondary | ICD-10-CM

## 2019-06-27 ENCOUNTER — Ambulatory Visit: Payer: 59 | Attending: Internal Medicine

## 2019-06-27 DIAGNOSIS — Z23 Encounter for immunization: Secondary | ICD-10-CM

## 2019-06-27 NOTE — Progress Notes (Signed)
   Covid-19 Vaccination Clinic  Name:  Kristina [name]    MRN: [id number] DOB: May 10, 1945  06/27/2019  Kristina [name] was observed post Covid-19 immunization for 15 minutes without incident. She was provided with Vaccine Information Sheet and instruction to access the V-Safe system.   Kristina [name] was instructed to call 911 with any severe reactions post vaccine: [name] Difficulty breathing  . Swelling of face and throat  . A fast heartbeat  . A bad rash all over body  . Dizziness and weakness   Immunizations Administered    Name Date Dose VIS Date Route   Pfizer COVID-19 Vaccine 06/27/2019  8:13 AM 0.3 mL 03/22/2019 Intramuscular   Manufacturer: [place]   Lot: [id number]   [place]: [id number]

## 2019-07-06 ENCOUNTER — Ambulatory Visit: Payer: Self-pay

## 2019-07-22 ENCOUNTER — Ambulatory Visit: Payer: 59 | Attending: Internal Medicine

## 2019-07-22 DIAGNOSIS — Z23 Encounter for immunization: Secondary | ICD-10-CM

## 2019-07-22 NOTE — Progress Notes (Signed)
   Covid-19 Vaccination Clinic  Name:  Kristina [name]    MRN: [id number] DOB: 04/08/1946  07/22/2019  [name] was observed post Covid-19 immunization for 15 minutes without incident. She was provided with Vaccine Information Sheet and instruction to access the V-Safe system.   [name] was instructed to call 911 with any severe reactions post vaccine: [name] Difficulty breathing  . Swelling of face and throat  . A fast heartbeat  . A bad rash all over body  . Dizziness and weakness   Immunizations Administered    Name Date Dose VIS Date Route   Pfizer COVID-19 Vaccine 07/22/2019  8:12 AM 0.3 mL 03/22/2019 Intramuscular   Manufacturer: [place]   Lot: [id number]   [place]: [id number]

## 2019-08-06 ENCOUNTER — Telehealth: Payer: Self-pay | Admitting: *Deleted

## 2019-08-06 NOTE — Telephone Encounter (Signed)
Left message for patient to call back. Dr [name] has asked that I contact patient to schedule colonoscopy for follow up hx tubular adenoma with high grade dysplasia. Patient will need a 2 day prep. Patient with robotic lysis of adhesions and low anterior resection on 06/13/19.

## 2019-08-07 NOTE — Telephone Encounter (Signed)
Left message for patient to call back  

## 2019-08-08 NOTE — Telephone Encounter (Signed)
I have left a message for patient to call back. I will send a letter to patient as well so she will hopefully call us back due to the fact that we havent been able to reach her thus far.

## 2019-08-09 NOTE — Telephone Encounter (Signed)
Pt returned call.  States Dr Dema Severin told her she would not need another colonoscopy for a year.  She is going to call him and then call us back.  Pt needs 2 day prep and previsit scheduled.

## 2019-09-19 ENCOUNTER — Telehealth: Payer: Self-pay | Admitting: Cardiovascular Disease

## 2019-09-19 NOTE — Telephone Encounter (Signed)
I attempted to contact patient 09/19/19 to schedule follow up visit with Kerin Ransom from patients recall list. The patient didn't answer so I left message for patient to call back to get that appointment scheduled.
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
## 2019-10-11 ENCOUNTER — Other Ambulatory Visit: Payer: Self-pay

## 2019-10-11 ENCOUNTER — Emergency Department (HOSPITAL_COMMUNITY): Payer: 59

## 2019-10-11 ENCOUNTER — Emergency Department (HOSPITAL_COMMUNITY)
Admission: EM | Admit: 2019-10-11 | Discharge: 2019-10-11 | Disposition: A | Discharge status: Home / Self Care | Payer: 59 | Attending: Emergency Medicine | Admitting: Emergency Medicine

## 2019-10-11 DIAGNOSIS — S79911A Unspecified injury of right hip, initial encounter: Secondary | ICD-10-CM | POA: Diagnosis present

## 2019-10-11 DIAGNOSIS — M25531 Pain in right wrist: Secondary | ICD-10-CM | POA: Insufficient documentation

## 2019-10-11 DIAGNOSIS — I119 Hypertensive heart disease without heart failure: Secondary | ICD-10-CM | POA: Diagnosis not present

## 2019-10-11 DIAGNOSIS — Y999 Unspecified external cause status: Secondary | ICD-10-CM | POA: Diagnosis not present

## 2019-10-11 DIAGNOSIS — S7001XA Contusion of right hip, initial encounter: Secondary | ICD-10-CM | POA: Diagnosis not present

## 2019-10-11 DIAGNOSIS — Y92410 Unspecified street and highway as the place of occurrence of the external cause: Secondary | ICD-10-CM | POA: Diagnosis not present

## 2019-10-11 DIAGNOSIS — Z79899 Other long term (current) drug therapy: Secondary | ICD-10-CM | POA: Insufficient documentation

## 2019-10-11 DIAGNOSIS — Z7982 Long term (current) use of aspirin: Secondary | ICD-10-CM | POA: Insufficient documentation

## 2019-10-11 DIAGNOSIS — I25110 Atherosclerotic heart disease of native coronary artery with unstable angina pectoris: Secondary | ICD-10-CM | POA: Diagnosis not present

## 2019-10-11 DIAGNOSIS — Y939 Activity, unspecified: Secondary | ICD-10-CM | POA: Insufficient documentation

## 2019-10-11 MED ORDER — ACETAMINOPHEN 500 MG PO TABS
1000.0000 mg | ORAL_TABLET | Freq: Once | ORAL | Status: AC
  Administered 2019-10-11: 1000 mg via ORAL
  Filled 2019-10-11: qty 2

## 2019-10-11 MED ORDER — METHOCARBAMOL 500 MG PO TABS
500.0000 mg | ORAL_TABLET | Freq: Two times a day (BID) | ORAL | 0 refills | Status: DC
Start: 2019-10-11 — End: 2019-10-11

## 2019-10-11 MED ORDER — METHOCARBAMOL 500 MG PO TABS
500.0000 mg | ORAL_TABLET | Freq: Two times a day (BID) | ORAL | 0 refills | Status: DC
Start: 2019-10-11 — End: 2021-05-26
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
## 2020-05-11 ENCOUNTER — Encounter: Payer: Self-pay | Admitting: General Practice

## 2020-05-11 ENCOUNTER — Telehealth (INDEPENDENT_AMBULATORY_CARE_PROVIDER_SITE_OTHER): Payer: 59 | Admitting: General Practice

## 2020-05-11 DIAGNOSIS — Z79899 Other long term (current) drug therapy: Secondary | ICD-10-CM

## 2020-05-11 DIAGNOSIS — I2510 Atherosclerotic heart disease of native coronary artery without angina pectoris: Secondary | ICD-10-CM

## 2020-05-11 DIAGNOSIS — E785 Hyperlipidemia, unspecified: Secondary | ICD-10-CM | POA: Diagnosis not present

## 2020-05-11 DIAGNOSIS — I10 Essential (primary) hypertension: Secondary | ICD-10-CM

## 2020-05-11 DIAGNOSIS — Z7189 Other specified counseling: Secondary | ICD-10-CM

## 2020-05-11 DIAGNOSIS — Z9861 Coronary angioplasty status: Secondary | ICD-10-CM

## 2020-05-11 NOTE — Patient Instructions (Signed)
Medication Instructions:  The current medical regimen is effective;  continue present plan and medications as directed. Please refer to the Current Medication list given to you today.  *If you need a refill on your cardiac medications before your next appointment, please call your pharmacy*  Lab Work: FASTING LIPID, BMET, LFT  If you have labs (blood work) drawn today and your tests are completely normal, you will receive your results only by:  [place] (if you have MyChart) OR A paper copy in the mail.  If you have any lab test that is abnormal or we need to change your treatment, we will call you to review the results. You may go to any [facility] that is convenient for you however, we do have a lab in our office that is able to assist you. You DO NOT need an appointment for our lab. The lab is open 8:00am and closes at 4:00pm. Lunch 12:45 - 1:45pm.  Special Instructions TAKE AND LOG YOUR BLOOD PRESSURE FOR 1-2 WEEKS AND SEND [place] RESULTS.  PLEASE READ AND FOLLOW SALTY 6-ATTACHED-1,800 mg daily  PLEASE INCREASE PHYSICAL ACTIVITY AS TOLERATED, GOAL 150 MINUTES/WEEEKLY  Follow-Up: Your next appointment:  12 month(s) In Person with Quay Burow, MD OR IF UNAVAILABLE JESSE CLEAVER, FNP-C   Please call our office 2 months in advance to schedule this appointment   At [facility], you and your health needs are our priority.  As part of our continuing mission to provide you with exceptional heart care, we have created designated Provider Care Teams.  These Care Teams include your primary Cardiologist (physician) and Advanced Practice Providers (APPs -  Physician Assistants and Nurse Practitioners) who all work together to provide you with the care you need, when you need it.

## 2020-05-19 ENCOUNTER — Ambulatory Visit (AMBULATORY_SURGERY_CENTER): Payer: Self-pay | Admitting: *Deleted

## 2020-05-19 ENCOUNTER — Other Ambulatory Visit: Payer: Self-pay

## 2020-05-19 VITALS — Ht 67.0 in | Wt 203.0 lb

## 2020-05-19 DIAGNOSIS — D374 Neoplasm of uncertain behavior of colon: Secondary | ICD-10-CM

## 2020-05-19 MED ORDER — SUPREP BOWEL PREP KIT 17.5-3.13-1.6 GM/177ML PO SOLN: 1.0000 | Freq: Once | ORAL | 0 refills | Status: AC

## 2020-05-19 NOTE — Progress Notes (Signed)
No egg or soy allergy known to patient  No issues with past sedation with any surgeries or procedures No intubation problems in the past  No FH of Malignant Hyperthermia No diet pills per patient No home 02 use per patient  No blood thinners per patient  Pt denies issues with constipation  No A fib or A flutter  EMMI video to pt or via [place] 19 guidelines implemented in PV today with Pt and RN  Pt is fully vaccinated  for [employer] given to pt in PV today , Code to Pharmacy and  NO PA's for preps discussed with pt  In PV today   Due to the COVID-19 pandemic we are asking patients to follow certain guidelines.  Pt aware of COVID protocols and [facility] guidelines

## 2020-05-19 NOTE — Patient Instructions (Signed)
Srep

## 2020-06-02 ENCOUNTER — Ambulatory Visit (AMBULATORY_SURGERY_CENTER): Payer: 59 | Admitting: Internal Medicine

## 2020-06-02 ENCOUNTER — Other Ambulatory Visit: Payer: Self-pay

## 2020-06-02 ENCOUNTER — Encounter: Payer: Self-pay | Admitting: Internal Medicine

## 2020-06-02 VITALS — BP 175/108 | HR 72 | Temp 97.9°F | Resp 19 | Ht 67.0 in | Wt 203.0 lb

## 2020-06-02 DIAGNOSIS — Z86010 Personal history of colonic polyps: Secondary | ICD-10-CM

## 2020-06-02 DIAGNOSIS — D124 Benign neoplasm of descending colon: Secondary | ICD-10-CM

## 2020-06-02 MED ORDER — SODIUM CHLORIDE 0.9 % IV SOLN: 500.0000 mL | Freq: Once | INTRAVENOUS | Status: AC

## 2020-06-02 NOTE — Patient Instructions (Signed)
Handout on polyps given to you today  Await pathology results on polyp removed     YOU HAD AN ENDOSCOPIC PROCEDURE TODAY AT THE [facility]:   Refer to the procedure report that was given to you for any specific questions about what was found during the examination.  If the procedure report does not answer your questions, please call your gastroenterologist to clarify.  If you requested that your care partner not be given the details of your procedure findings, then the procedure report has been included in a sealed envelope for you to review at your convenience later.  YOU SHOULD EXPECT: Some feelings of bloating in the abdomen. Passage of more gas than usual.  Walking can help get rid of the air that was put into your GI tract during the procedure and reduce the bloating. If you had a lower endoscopy (such as a colonoscopy or flexible sigmoidoscopy) you may notice spotting of blood in your stool or on the toilet paper. If you underwent a bowel prep for your procedure, you may not have a normal bowel movement for a few days.  Please Note:  You might notice some irritation and congestion in your nose or some drainage.  This is from the oxygen used during your procedure.  There is no need for concern and it should clear up in a day or so.  SYMPTOMS TO REPORT IMMEDIATELY:   Following lower endoscopy (colonoscopy or flexible sigmoidoscopy):  Excessive amounts of blood in the stool  Significant tenderness or worsening of abdominal pains  Swelling of the abdomen that is new, acute  Fever of 100F or higher    For urgent or emergent issues, a gastroenterologist can be reached at any hour by calling [phone]. Do not use MyChart messaging for urgent concerns.    DIET:  We do recommend a small meal at first, but then you may proceed to your regular diet.  Drink plenty of fluids but you should avoid alcoholic beverages for 24 hours.  ACTIVITY:  You should plan to take it easy  for the rest of today and you should NOT DRIVE or use heavy machinery until tomorrow (because of the sedation medicines used during the test).    FOLLOW UP: Our staff will call the number listed on your records 48-72 hours following your procedure to check on you and address any questions or concerns that you may have regarding the information given to you following your procedure. If we do not reach you, we will leave a message.  We will attempt to reach you two times.  During this call, we will ask if you have developed any symptoms of COVID 19. If you develop any symptoms (ie: fever, flu-like symptoms, shortness of breath, cough etc.) before then, please call [phone].  If you test positive for Covid 19 in the 2 weeks post procedure, please call and report this information to us.    If any biopsies were taken you will be contacted by phone or by letter within the next 1-3 weeks.  Please call us at [phone] if you have not heard about the biopsies in 3 weeks.    SIGNATURES/CONFIDENTIALITY: You and/or your care partner have signed paperwork which will be entered into your electronic medical record.  These signatures attest to the fact that that the information above on your After Visit Summary has been reviewed and is understood.  Full responsibility of the confidentiality of this discharge information lies with you and/or your   care-partner.  

## 2020-06-02 NOTE — Progress Notes (Signed)
Called to room to assist during endoscopic procedure.  Patient ID and intended procedure confirmed with present staff. Received instructions for my participation in the procedure from the performing physician.  

## 2020-06-02 NOTE — Op Note (Signed)
[place] Patient Name: Kristina [name] Procedure Date: 06/02/2020 11:45 AM MRN: [id number] Endoscopist: [name] , MD Age: 75 Referring MD:  Date of Birth: 02/22/1946 Gender: Female Account #: [id number] Procedure:                Colonoscopy Indications:              High risk colon cancer surveillance: Personal                            history of adenoma with villous component + high                            grade dysplasia s/p LAR March 2021, Last                            colonoscopy: January 2021 with fair prep Medicines:                Monitored Anesthesia Care Procedure:                Pre-Anesthesia Assessment:                           - Prior to the procedure, a History and Physical                            was performed, and patient medications and                            allergies were reviewed. The patient's tolerance of                            previous anesthesia was also reviewed. The risks                            and benefits of the procedure and the sedation                            options and risks were discussed with the patient.                            All questions were answered, and informed consent                            was obtained. Prior Anticoagulants: The patient has                            taken no previous anticoagulant or antiplatelet                            agents. ASA Grade Assessment: III - A patient with                            severe systemic disease. After reviewing the risks  and benefits, the patient was deemed in                            satisfactory condition to undergo the procedure.                           After obtaining informed consent, the colonoscope                            was passed under direct vision. Throughout the                            procedure, the patient's blood pressure, pulse, and                            oxygen saturations were monitored  continuously. The                            Olympus CF-HQ190L [id number]) Colonoscope was                            introduced through the anus and advanced to the                            cecum, identified by appendiceal orifice and                            ileocecal valve. The colonoscopy was performed                            without difficulty. The patient tolerated the                            procedure well. The quality of the bowel                            preparation was good. The ileocecal valve,                            appendiceal orifice, and rectum were photographed.                            The bowel preparation used was 2 day MiraLax via                            split dose instruction. Scope In: 11:59:06 AM Scope Out: 12:20:08 PM Scope Withdrawal Time: 0 hours 17 minutes 58 seconds  Total Procedure Duration: 0 hours 21 minutes 2 seconds  Findings:                 The digital rectal exam was normal.                           A 5 mm polyp was found in the descending colon. The  polyp was sessile. The polyp was removed with a                            cold snare. Resection and retrieval were complete.                           There was evidence of a prior end-to-end                            colo-colonic anastomosis in the distal sigmoid                            colon. This was patent and was characterized by                            healthy appearing mucosa.                           The retroflexed view of the distal rectum and anal                            verge was normal and showed no anal or rectal                            abnormalities. Complications:            No immediate complications. Estimated Blood Loss:     Estimated blood loss was minimal. Impression:               - One 5 mm polyp in the descending colon, removed                            with a cold snare. Resected and retrieved.                            - Patent end-to-end colo-colonic anastomosis,                            characterized by healthy appearing mucosa.                           - The distal rectum and anal verge are normal on                            retroflexion view. Recommendation:           - Patient has a contact number available for                            emergencies. The signs and symptoms of potential                            delayed complications were discussed with the  patient. Return to normal activities tomorrow.                            Written discharge instructions were provided to the                            patient.                           - Resume previous diet.                           - Continue present medications.                           - Await pathology results.                           - Repeat colonoscopy is recommended for                            surveillance in 3 years. The colonoscopy date will                            be determined after pathology results from today's                            exam become available for review. [name], MD 06/02/2020 12:25:03 PM This report has been signed electronically.

## 2020-06-02 NOTE — Progress Notes (Signed)
Vitals-CW  Patient does have screws in her l arm  Pt's states no medical or surgical changes since previsit or office visit.

## 2020-06-02 NOTE — Progress Notes (Signed)
To PACU, VSS. Report to [email or web address] 

## 2020-06-04 ENCOUNTER — Telehealth: Payer: Self-pay | Admitting: *Deleted

## 2020-06-04 ENCOUNTER — Telehealth: Payer: Self-pay

## 2020-06-04 NOTE — Telephone Encounter (Signed)
Left message on follow up call. 

## 2020-06-04 NOTE — Telephone Encounter (Signed)
Second follow up call attempt.  LVM ?

## 2020-06-10 ENCOUNTER — Encounter: Payer: Self-pay | Admitting: Internal Medicine

## 2020-06-18 ENCOUNTER — Other Ambulatory Visit: Payer: Self-pay | Admitting: Internal Medicine

## 2020-06-19 LAB — COMPLETE METABOLIC PANEL WITH GFR
AG Ratio: 1.9 (calc) (ref 1.0–2.5)
ALT: 9 U/L (ref 6–29)
AST: 13 U/L (ref 10–35)
Albumin: 4 g/dL (ref 3.6–5.1)
Alkaline phosphatase (APISO): 85 U/L (ref 37–153)
BUN: 10 mg/dL (ref 7–25)
CO2: 26 mmol/L (ref 20–32)
Calcium: 9.1 mg/dL (ref 8.6–10.4)
Chloride: 106 mmol/L (ref 98–110)
Creat: 0.86 mg/dL (ref 0.60–0.93)
GFR, Est African American: 78 mL/min/{1.73_m2} (ref >=60)
GFR, Est Non African American: 67 mL/min/{1.73_m2} (ref >=60)
Globulin: 2.1 g/dL (calc) (ref 1.9–3.7)
Glucose, Bld: 121 mg/dL — ABNORMAL HIGH (ref 65–99)
Potassium: 3.5 mmol/L (ref 3.5–5.3)
Sodium: 144 mmol/L (ref 135–146)
Total Bilirubin: 0.3 mg/dL (ref 0.2–1.2)
Total Protein: 6.1 g/dL (ref 6.1–8.1)

## 2020-06-19 LAB — LIPID PANEL
Cholesterol: 155 mg/dL (ref <200)
HDL: 56 mg/dL (ref >=50)
LDL Cholesterol (Calc): 69 mg/dL (calc)
Non-HDL Cholesterol (Calc): 99 mg/dL (calc) (ref <130)
Total CHOL/HDL Ratio: 2.8 (calc) (ref <5.0)
Triglycerides: 239 mg/dL — ABNORMAL HIGH (ref <150)

## 2020-06-19 LAB — CBC
HCT: 35 % (ref 35.0–45.0)
Hemoglobin: 11.8 g/dL (ref 11.7–15.5)
MCH: 30.7 pg (ref 27.0–33.0)
MCHC: 33.7 g/dL (ref 32.0–36.0)
MCV: 91.1 fL (ref 80.0–100.0)
MPV: 10.8 fL (ref 7.5–12.5)
Platelets: 225 10*3/uL (ref 140–400)
RBC: 3.84 10*6/uL (ref 3.80–5.10)
RDW: 13 % (ref 11.0–15.0)
WBC: 4.6 10*3/uL (ref 3.8–10.8)

## 2020-06-19 LAB — TSH: TSH: 1.37 mIU/L (ref 0.40–4.50)

## 2020-06-22 ENCOUNTER — Other Ambulatory Visit: Payer: Self-pay | Admitting: Internal Medicine

## 2020-06-22 DIAGNOSIS — E2839 Other primary ovarian failure: Secondary | ICD-10-CM

## 2020-06-23 ENCOUNTER — Other Ambulatory Visit: Payer: Self-pay | Admitting: Internal Medicine

## 2020-06-23 DIAGNOSIS — Z1231 Encounter for screening mammogram for malignant neoplasm of breast: Secondary | ICD-10-CM

## 2020-06-25 ENCOUNTER — Ambulatory Visit
Admission: RE | Admit: 2020-06-25 | Discharge: 2020-06-25 | Disposition: A | Discharge status: Home / Self Care | Payer: 59 | Source: Ambulatory Visit | Attending: Internal Medicine | Admitting: Internal Medicine

## 2020-06-25 ENCOUNTER — Other Ambulatory Visit: Payer: Self-pay

## 2020-06-25 DIAGNOSIS — Z1231 Encounter for screening mammogram for malignant neoplasm of breast: Secondary | ICD-10-CM

## 2020-07-02 LAB — LIPID PANEL
Chol/HDL Ratio: 2.6 ratio (ref 0.0–4.4)
Cholesterol, Total: 165 mg/dL (ref 100–199)
HDL: 64 mg/dL (ref >39)
LDL Chol Calc (NIH): 82 mg/dL (ref 0–99)
Triglycerides: 106 mg/dL (ref 0–149)
VLDL Cholesterol Cal: 19 mg/dL (ref 5–40)

## 2020-07-02 LAB — BASIC METABOLIC PANEL
BUN/Creatinine Ratio: 11 — ABNORMAL LOW (ref 12–28)
BUN: 10 mg/dL (ref 8–27)
CO2: 24 mmol/L (ref 20–29)
Calcium: 9.2 mg/dL (ref 8.7–10.3)
Chloride: 103 mmol/L (ref 96–106)
Creatinine, Ser: 0.88 mg/dL (ref 0.57–1.00)
Glucose: 98 mg/dL (ref 65–99)
Potassium: 3.9 mmol/L (ref 3.5–5.2)
Sodium: 142 mmol/L (ref 134–144)
eGFR: 70 mL/min/{1.73_m2} (ref >59)

## 2020-07-02 LAB — HEPATIC FUNCTION PANEL
ALT: 11 IU/L (ref 0–32)
AST: 15 IU/L (ref 0–40)
Albumin: 4.2 g/dL (ref 3.7–4.7)
Alkaline Phosphatase: 96 IU/L (ref 44–121)
Bilirubin Total: 0.3 mg/dL (ref 0.0–1.2)
Bilirubin, Direct: <0.1 mg/dL (ref 0.00–0.40)
Total Protein: 6.2 g/dL (ref 6.0–8.5)

## 2020-11-30 ENCOUNTER — Ambulatory Visit
Admission: RE | Admit: 2020-11-30 | Discharge: 2020-11-30 | Disposition: A | Discharge status: Home / Self Care | Payer: 59 | Source: Ambulatory Visit | Attending: Internal Medicine | Admitting: Internal Medicine

## 2020-11-30 ENCOUNTER — Other Ambulatory Visit: Payer: Self-pay

## 2020-11-30 DIAGNOSIS — E2839 Other primary ovarian failure: Secondary | ICD-10-CM

## 2020-12-25 ENCOUNTER — Emergency Department (HOSPITAL_COMMUNITY): Payer: 59

## 2020-12-25 ENCOUNTER — Emergency Department (HOSPITAL_COMMUNITY)
Admission: EM | Admit: 2020-12-25 | Discharge: 2020-12-25 | Disposition: A | Discharge status: Home / Self Care | Payer: 59 | Attending: Emergency Medicine | Admitting: Emergency Medicine

## 2020-12-25 ENCOUNTER — Other Ambulatory Visit: Payer: Self-pay

## 2020-12-25 ENCOUNTER — Encounter (HOSPITAL_COMMUNITY): Payer: Self-pay

## 2020-12-25 DIAGNOSIS — S4991XA Unspecified injury of right shoulder and upper arm, initial encounter: Secondary | ICD-10-CM | POA: Diagnosis present

## 2020-12-25 DIAGNOSIS — W07XXXA Fall from chair, initial encounter: Secondary | ICD-10-CM | POA: Insufficient documentation

## 2020-12-25 DIAGNOSIS — Z8616 Personal history of COVID-19: Secondary | ICD-10-CM | POA: Diagnosis not present

## 2020-12-25 DIAGNOSIS — Z79899 Other long term (current) drug therapy: Secondary | ICD-10-CM | POA: Insufficient documentation

## 2020-12-25 DIAGNOSIS — W19XXXA Unspecified fall, initial encounter: Secondary | ICD-10-CM

## 2020-12-25 DIAGNOSIS — J45909 Unspecified asthma, uncomplicated: Secondary | ICD-10-CM | POA: Insufficient documentation

## 2020-12-25 DIAGNOSIS — I2510 Atherosclerotic heart disease of native coronary artery without angina pectoris: Secondary | ICD-10-CM | POA: Diagnosis not present

## 2020-12-25 DIAGNOSIS — Z7982 Long term (current) use of aspirin: Secondary | ICD-10-CM | POA: Diagnosis not present

## 2020-12-25 DIAGNOSIS — S42201A Unspecified fracture of upper end of right humerus, initial encounter for closed fracture: Secondary | ICD-10-CM | POA: Diagnosis not present

## 2020-12-25 DIAGNOSIS — I10 Essential (primary) hypertension: Secondary | ICD-10-CM | POA: Diagnosis not present

## 2020-12-25 DIAGNOSIS — Z85038 Personal history of other malignant neoplasm of large intestine: Secondary | ICD-10-CM | POA: Insufficient documentation

## 2020-12-25 MED ORDER — FENTANYL CITRATE PF 50 MCG/ML IJ SOSY
50.0000 ug | PREFILLED_SYRINGE | Freq: Once | INTRAMUSCULAR | Status: AC
  Administered 2020-12-25: 50 ug via INTRAVENOUS
  Filled 2020-12-25: qty 1

## 2020-12-25 MED ORDER — METHOCARBAMOL 500 MG PO TABS: 500.0000 mg | ORAL_TABLET | Freq: Two times a day (BID) | ORAL | 0 refills | Status: DC

## 2020-12-25 MED ORDER — FENTANYL CITRATE PF 50 MCG/ML IJ SOSY
50.0000 ug | PREFILLED_SYRINGE | Freq: Once | INTRAMUSCULAR | Status: AC
Start: 2020-12-25 — End: 2020-12-25
  Administered 2020-12-25: 50 ug via INTRAVENOUS
  Filled 2020-12-25: qty 1

## 2020-12-25 MED ORDER — ONDANSETRON HCL 4 MG/2ML IJ SOLN
4.0000 mg | Freq: Once | INTRAMUSCULAR | Status: AC
  Administered 2020-12-25: 4 mg via INTRAVENOUS
  Filled 2020-12-25: qty 2

## 2020-12-25 MED ORDER — OXYCODONE-ACETAMINOPHEN 5-325 MG PO TABS
1.0000 | ORAL_TABLET | Freq: Four times a day (QID) | ORAL | 0 refills | Status: DC | PRN
Start: 2020-12-25 — End: 2021-05-26

## 2020-12-25 NOTE — Progress Notes (Signed)
Orthopedic Tech Progress Note Patient Details:  Kristina [name] [birth year] [id number]  Patient ID: Kristina [name], female   DOB: 1946-04-11, 75 y.o.   MRN: [id number]  [name] 12/25/2020, 5:42 PMCalled [employer] for shoulder abduction pillow.

## 2020-12-25 NOTE — ED Triage Notes (Signed)
Pt BIB [facility] from home. Patient was standing on chair and fell on the right shoulder. No LOC, patient is not on blood thinners. 20g in left wrist placed by EMS. 242m of NS and 100 of fentanyl was given by EMS. No signs of deformity noted by EMS.  129/80 72-HR 94%-96% on RA 194 CBG

## 2020-12-25 NOTE — ED Provider Notes (Signed)
  Physical Exam  BP (!) 182/105   Pulse 91   Temp 97.9 F (36.6 C) (Oral)   Resp 15   Ht '5\' 7"'$  (1.702 m)   Wt 92 kg   SpO2 100%   BMI 31.77 kg/m   Physical Exam Vitals and nursing note reviewed.  Constitutional:      General: She is not in acute distress.    Appearance: She is well-developed.  HENT:     Head: Normocephalic and atraumatic.  Eyes:     Conjunctiva/sclera: Conjunctivae normal.  Cardiovascular:     Rate and Rhythm: Normal rate and regular rhythm.     Heart sounds: No murmur heard. Pulmonary:     Effort: Pulmonary effort is normal. No respiratory distress.     Breath sounds: Normal breath sounds.  Abdominal:     Palpations: Abdomen is soft.     Tenderness: There is no abdominal tenderness.  Musculoskeletal:        General: Swelling and tenderness (Right shoulder) present.     Cervical back: Neck supple.  Skin:    General: Skin is warm and dry.  Neurological:     Mental Status: She is alert.    ED Course/Procedures     Procedures  MDM  Patient received in handoff after suffering a proximal humerus fracture.  Patient pending preoperative CT imaging.  Patient received the CT use and was placed in a sling and swath.  Patient then discharged with orthopedic follow-up       Teressa Lower, MD 12/25/20 2255

## 2020-12-25 NOTE — ED Notes (Signed)
ED Provider at bedside. 
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
## 2021-01-06 ENCOUNTER — Telehealth: Payer: Self-pay | Admitting: Cardiovascular Disease

## 2021-01-06 NOTE — Telephone Encounter (Signed)
Called patient left message on personal voice mail to call back. 

## 2021-01-06 NOTE — Telephone Encounter (Signed)
Spoke to patient she stated she has had swelling in both feet and ankles for the past 2 days.No sob.Weight stable.She is on a low salt diet.Advised to double Lasix dose for the next 3 days then return to normal dose.Keep appointment already scheduled with Sheela Stack NP 10/5 at 11:15 am.

## 2021-01-06 NOTE — Telephone Encounter (Signed)
Pt c/o swelling: STAT is pt has developed SOB within 24 hours  If swelling, where is the swelling located? Ankles and feet  How much weight have you gained and in what time span?   Have you gained 3 pounds in a day or 5 pounds in a week? No  Do you have a log of your daily weights (if so, list)? No  Are you currently taking a fluid pill? yes  Are you currently SOB? No- feet feel numb  Have you traveled recently? No- patient wanted an appointment- I made her an appointment with Denyse Amass at [facility] on next Wednesday- please call to evaluate
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
## 2021-01-13 ENCOUNTER — Ambulatory Visit (HOSPITAL_BASED_OUTPATIENT_CLINIC_OR_DEPARTMENT_OTHER): Payer: 59 | Admitting: General Practice

## 2021-05-26 ENCOUNTER — Other Ambulatory Visit: Payer: Self-pay

## 2021-05-26 ENCOUNTER — Ambulatory Visit: Payer: 59 | Admitting: Podiatry

## 2021-05-26 DIAGNOSIS — L600 Ingrowing nail: Secondary | ICD-10-CM

## 2021-05-26 NOTE — Patient Instructions (Signed)

## 2021-05-27 NOTE — Progress Notes (Signed)
Subjective:   Patient ID: Kristina [name], female   DOB: 76 y.o.   MRN: [id number]   HPI Patient presents with chronic ingrown toenail of the big toes both feet that are painful when pressed and make shoe gear difficult.  She cannot cut about her self states that she tries to soak them and trim them without relief and they are possible for her to get rid of that she like them taken care of permanently.  Patient does not smoke likes to be active   Review of Systems  All other systems reviewed and are negative.      Objective:  Physical Exam Vitals and nursing note reviewed.  Constitutional:      Appearance: She is well-developed.  Pulmonary:     Effort: Pulmonary effort is normal.  Musculoskeletal:        General: Normal range of motion.  Skin:    General: Skin is warm.  Neurological:     Mental Status: She is alert.    Neurovascular status intact muscle strength found to be adequate range of motion within normal limits.  Patient is noted to have incurvation of the medial border of the hallux bilateral painful when pressed with no redness no active drainage noted with good digital perfusion noted.     Assessment:  Chronic ingrown toenail deformity hallux bilateral medial borders     Plan:  H&P reviewed condition recommended removal of the nail borders explained procedure risk and patient wants surgery understanding risk and chances for nonhealing.  Today I infiltrated each hallux 60 mg like Marcaine mixture sterile prep done and using sterile instrumentation remove the medial borders exposed matrix applied phenol 3 applications 30 seconds followed by alcohol lavage to each border applied sterile dressing and reappoint for [place] to recheck encouraging her to leave dressing on 24 hours but take it off earlier if any throbbing were to occur

## 2021-06-10 ENCOUNTER — Other Ambulatory Visit: Payer: Self-pay | Admitting: Internal Medicine

## 2021-06-11 LAB — LIPID PANEL
Cholesterol: 178 mg/dL (ref <200)
HDL: 61 mg/dL (ref >=50)
LDL Cholesterol (Calc): 93 mg/dL (calc)
Non-HDL Cholesterol (Calc): 117 mg/dL (calc) (ref <130)
Total CHOL/HDL Ratio: 2.9 (calc) (ref <5.0)
Triglycerides: 139 mg/dL (ref <150)

## 2021-06-11 LAB — CBC
HCT: 36.4 % (ref 35.0–45.0)
Hemoglobin: 12.1 g/dL (ref 11.7–15.5)
MCH: 30.1 pg (ref 27.0–33.0)
MCHC: 33.2 g/dL (ref 32.0–36.0)
MCV: 90.5 fL (ref 80.0–100.0)
MPV: 10.9 fL (ref 7.5–12.5)
Platelets: 227 10*3/uL (ref 140–400)
RBC: 4.02 10*6/uL (ref 3.80–5.10)
RDW: 13.6 % (ref 11.0–15.0)
WBC: 4.3 10*3/uL (ref 3.8–10.8)

## 2021-06-11 LAB — COMPLETE METABOLIC PANEL WITH GFR
AG Ratio: 1.7 (calc) (ref 1.0–2.5)
ALT: 11 U/L (ref 6–29)
AST: 14 U/L (ref 10–35)
Albumin: 4.1 g/dL (ref 3.6–5.1)
Alkaline phosphatase (APISO): 75 U/L (ref 37–153)
BUN: 10 mg/dL (ref 7–25)
CO2: 27 mmol/L (ref 20–32)
Calcium: 9.5 mg/dL (ref 8.6–10.4)
Chloride: 105 mmol/L (ref 98–110)
Creat: 0.98 mg/dL (ref 0.60–1.00)
Globulin: 2.4 g/dL (calc) (ref 1.9–3.7)
Glucose, Bld: 95 mg/dL (ref 65–99)
Potassium: 4 mmol/L (ref 3.5–5.3)
Sodium: 141 mmol/L (ref 135–146)
Total Bilirubin: 0.5 mg/dL (ref 0.2–1.2)
Total Protein: 6.5 g/dL (ref 6.1–8.1)
eGFR: 61 mL/min/{1.73_m2} (ref >=60)

## 2021-06-11 LAB — TSH: TSH: 1.65 mIU/L (ref 0.40–4.50)

## 2021-07-05 ENCOUNTER — Other Ambulatory Visit: Payer: Self-pay | Admitting: Internal Medicine

## 2021-07-05 DIAGNOSIS — Z1231 Encounter for screening mammogram for malignant neoplasm of breast: Secondary | ICD-10-CM

## 2021-07-08 ENCOUNTER — Ambulatory Visit
Admission: RE | Admit: 2021-07-08 | Discharge: 2021-07-08 | Disposition: A | Discharge status: Home / Self Care | Payer: 59 | Source: Ambulatory Visit | Attending: Internal Medicine | Admitting: Internal Medicine

## 2021-07-08 DIAGNOSIS — Z1231 Encounter for screening mammogram for malignant neoplasm of breast: Secondary | ICD-10-CM

## 2021-12-31 ENCOUNTER — Ambulatory Visit (INDEPENDENT_AMBULATORY_CARE_PROVIDER_SITE_OTHER): Payer: 59 | Admitting: Podiatry

## 2021-12-31 ENCOUNTER — Encounter: Payer: Self-pay | Admitting: Podiatry

## 2021-12-31 ENCOUNTER — Ambulatory Visit (INDEPENDENT_AMBULATORY_CARE_PROVIDER_SITE_OTHER): Payer: 59

## 2021-12-31 DIAGNOSIS — M722 Plantar fascial fibromatosis: Secondary | ICD-10-CM | POA: Diagnosis not present

## 2021-12-31 MED ORDER — TRIAMCINOLONE ACETONIDE 10 MG/ML IJ SUSP
10.0000 mg | Freq: Once | INTRAMUSCULAR | Status: AC
Start: 2021-12-31 — End: 2021-12-31
  Administered 2021-12-31: 10 mg

## 2022-01-02 NOTE — Progress Notes (Signed)
Subjective:   Patient ID: Kristina [name], female   DOB: 76 y.o.   MRN: [id number]   HPI Patient presents stating she has developed intense discomfort in the plantar heel left over right of 1 month duration.  Patient does not remember specific injury and states its been quite sore and making it hard to walk   ROS      Objective:  Physical Exam  Neurovascular status intact with inflammation pain of the plantar fascia bilateral with fluid buildup around the medial band     Assessment:  Acute plantar fasciitis left over right at the insertion to the calcaneus      Plan:  Reviewed condition went ahead today did sterile prep and injected the medial fascial band bilateral 3 mg Kenalog 5 mg Xylocaine advised on stretching exercises and fitted and dispensed fascial braces to lift up the arch bilateral take pressure off the medial band of the fascia.  Reappoint 2 weeks  X-rays indicate spur no indication stress fracture arthritis

## 2022-01-20 ENCOUNTER — Ambulatory Visit (INDEPENDENT_AMBULATORY_CARE_PROVIDER_SITE_OTHER): Payer: 59 | Admitting: Podiatry

## 2022-01-20 ENCOUNTER — Encounter: Payer: Self-pay | Admitting: Podiatry

## 2022-01-20 DIAGNOSIS — M722 Plantar fascial fibromatosis: Secondary | ICD-10-CM

## 2022-01-20 MED ORDER — TRIAMCINOLONE ACETONIDE 10 MG/ML IJ SUSP
10.0000 mg | Freq: Once | INTRAMUSCULAR | Status: AC
  Administered 2022-01-20: 10 mg

## 2022-01-20 NOTE — Progress Notes (Signed)
Subjective:   Patient ID: Kristina [name], female   DOB: 76 y.o.   MRN: [id number]   HPI Patient states the left heel is hurting severely bad the right heel is doing better but she cannot bear good weight on the left heel and feels like she is walking on a broken bone.  Patient states it is worse when she gets up and after periods of sitting   ROS      Objective:  Physical Exam  Acute plantar fasciitis left it so far is not responding conservatively to aggressive conservative treatment with pain in the medial band of the plantar fascia     Assessment:  Acute plantar fasciitis left no indication stress fracture     Plan:  H&P reviewed condition discussed immobilization she wants to pursue this route and after first doing sterile prep and injected the plantar fascia 3 mg Kenalog 5 mg Xylocaine with sterile dressing we dispensed air fracture walker with instructions on usage it was fitted properly to the lower leg and all instructions given on wearing this properly and patient to be seen back in 3 weeks

## 2022-02-10 ENCOUNTER — Ambulatory Visit: Payer: 59 | Admitting: Podiatry

## 2022-02-10 ENCOUNTER — Ambulatory Visit (INDEPENDENT_AMBULATORY_CARE_PROVIDER_SITE_OTHER): Payer: 59 | Admitting: Podiatry

## 2022-02-10 ENCOUNTER — Encounter: Payer: Self-pay | Admitting: Podiatry

## 2022-02-10 DIAGNOSIS — M722 Plantar fascial fibromatosis: Secondary | ICD-10-CM

## 2022-02-10 NOTE — Progress Notes (Signed)
Subjective:   Patient ID: Kristina [name], female   DOB: 76 y.o.   MRN: [id number]   HPI Patient states improving with diminishment of discomfort   ROS      Objective:  Physical Exam  Neuro vascular status intact with diminishment of pain in the left plantar fascia at insertion     Assessment:  Improvement fasciitis-like symptoms left     Plan:  Reviewed condition recommended the continuation of physical therapy anti-inflammatories and support and patient will be seen back as needed

## 2022-06-06 ENCOUNTER — Other Ambulatory Visit: Payer: Self-pay | Admitting: Internal Medicine

## 2022-06-06 DIAGNOSIS — Z1231 Encounter for screening mammogram for malignant neoplasm of breast: Secondary | ICD-10-CM

## 2022-07-18 ENCOUNTER — Ambulatory Visit
Admission: RE | Admit: 2022-07-18 | Discharge: 2022-07-18 | Disposition: A | Discharge status: Home / Self Care | Payer: 59 | Source: Ambulatory Visit | Attending: Internal Medicine | Admitting: Internal Medicine

## 2022-07-18 DIAGNOSIS — Z1231 Encounter for screening mammogram for malignant neoplasm of breast: Secondary | ICD-10-CM

## 2022-08-24 ENCOUNTER — Ambulatory Visit: Payer: 59 | Attending: Cardiovascular Disease | Admitting: Cardiovascular Disease

## 2022-08-24 ENCOUNTER — Encounter: Payer: Self-pay | Admitting: Cardiovascular Disease

## 2022-08-24 VITALS — BP 138/84 | HR 93 | Ht 66.0 in | Wt 207.0 lb

## 2022-08-24 DIAGNOSIS — I2510 Atherosclerotic heart disease of native coronary artery without angina pectoris: Secondary | ICD-10-CM | POA: Diagnosis not present

## 2022-08-24 DIAGNOSIS — Z9861 Coronary angioplasty status: Secondary | ICD-10-CM | POA: Diagnosis not present

## 2022-08-24 DIAGNOSIS — I10 Essential (primary) hypertension: Secondary | ICD-10-CM

## 2022-08-24 DIAGNOSIS — E785 Hyperlipidemia, unspecified: Secondary | ICD-10-CM

## 2022-08-24 NOTE — Assessment & Plan Note (Signed)
History of CAD status post non-STEMI 06/22/2016 with catheterization performed by Dr. Excell Seltzer revealing a proximal LAD stenosis which was stented with a 4 mm Synergy drug-eluting stent postdilated to 4.5 mm.  She did have a high-grade circumflex stenosis on a band which was treated medically and normal RCA with normal LV function.  She denies chest pain.

## 2022-08-24 NOTE — Assessment & Plan Note (Signed)
History of essential hypertension blood pressure measured today at 138/84.  She is on losartan.

## 2022-08-24 NOTE — Assessment & Plan Note (Signed)
She of hyperlipidemia on high-dose atorvastatin with lipid profile performed 06/10/2021 revealing total knee 8, LDL 93 and HDL 61, not at goal for secondary prevention.  We will recheck a fasting lipid and liver profile.
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
## 2022-08-24 NOTE — Patient Instructions (Addendum)
Medication Instructions:  Your physician recommends that you continue on your current medications as directed. Please refer to the Current Medication list given to you today.  *If you need a refill on your cardiac medications before your next appointment, please call your pharmacy*   Lab Work: Your physician recommends that you return for lab work in: the next week or 2 for FASTING Lipid/liver panel  If you have labs (blood work) drawn today and your tests are completely normal, you will receive your results only by: MyChart Message (if you have MyChart) OR A paper copy in the mail If you have any lab test that is abnormal or we need to change your treatment, we will call you to review the results.   Follow-Up: At [facility], you and your health needs are our priority.  As part of our continuing mission to provide you with exceptional heart care, we have created designated Provider Care Teams.  These Care Teams include your primary Cardiologist (physician) and Advanced Practice Providers (APPs -  Physician Assistants and Nurse Practitioners) who all work together to provide you with the care you need, when you need it.  We recommend signing up for the patient portal called "MyChart".  Sign up information is provided on this After Visit Summary.  MyChart is used to connect with patients for Virtual Visits (Telemedicine).  Patients are able to view lab/test results, encounter notes, upcoming appointments, etc.  Non-urgent messages can be sent to your provider as well.   To learn more about what you can do with MyChart, go to [email or web address].    Your next appointment:   12 month(s)  Provider:   Nanetta Batty, MD

## 2022-09-15 LAB — HEPATIC FUNCTION PANEL
ALT: 15 IU/L (ref 0–32)
AST: 17 IU/L (ref 0–40)
Albumin: 4.4 g/dL (ref 3.8–4.8)
Alkaline Phosphatase: 76 IU/L (ref 44–121)
Bilirubin Total: 0.5 mg/dL (ref 0.0–1.2)
Bilirubin, Direct: 0.13 mg/dL (ref 0.00–0.40)
Total Protein: 6.6 g/dL (ref 6.0–8.5)

## 2022-09-15 LAB — LIPID PANEL
Chol/HDL Ratio: 2.5 ratio (ref 0.0–4.4)
Cholesterol, Total: 172 mg/dL (ref 100–199)
HDL: 68 mg/dL (ref >39)
LDL Chol Calc (NIH): 83 mg/dL (ref 0–99)
Triglycerides: 119 mg/dL (ref 0–149)
VLDL Cholesterol Cal: 21 mg/dL (ref 5–40)

## 2022-09-23 ENCOUNTER — Telehealth: Payer: Self-pay | Admitting: Cardiovascular Disease

## 2022-09-23 DIAGNOSIS — E785 Hyperlipidemia, unspecified: Secondary | ICD-10-CM

## 2022-09-23 MED ORDER — EZETIMIBE 10 MG PO TABS: 10.0000 mg | ORAL_TABLET | Freq: Every day | ORAL | 3 refills | Status: DC

## 2022-09-23 NOTE — Telephone Encounter (Signed)
Labs orders placed and mailed to pt's home address.

## 2022-09-23 NOTE — Telephone Encounter (Signed)
Patient is returning call to discuss lab results. 

## 2022-09-23 NOTE — Telephone Encounter (Signed)
Spoke with patient about lab results and provider recommendations. She verbalized understanding. She would like to start Zetia. Sent Rx to pharmacy

## 2022-10-06 ENCOUNTER — Ambulatory Visit: Payer: 59 | Admitting: Podiatry

## 2022-10-10 ENCOUNTER — Ambulatory Visit (INDEPENDENT_AMBULATORY_CARE_PROVIDER_SITE_OTHER): Payer: 59 | Admitting: Podiatry

## 2022-10-10 ENCOUNTER — Encounter: Payer: Self-pay | Admitting: Podiatry

## 2022-10-10 ENCOUNTER — Ambulatory Visit (INDEPENDENT_AMBULATORY_CARE_PROVIDER_SITE_OTHER): Payer: 59

## 2022-10-10 ENCOUNTER — Other Ambulatory Visit: Payer: Self-pay | Admitting: Podiatry

## 2022-10-10 DIAGNOSIS — M779 Enthesopathy, unspecified: Secondary | ICD-10-CM

## 2022-10-10 DIAGNOSIS — M722 Plantar fascial fibromatosis: Secondary | ICD-10-CM | POA: Diagnosis not present

## 2022-10-10 DIAGNOSIS — M778 Other enthesopathies, not elsewhere classified: Secondary | ICD-10-CM | POA: Diagnosis not present

## 2022-10-10 MED ORDER — TRIAMCINOLONE ACETONIDE 10 MG/ML IJ SUSP
10.0000 mg | Freq: Once | INTRAMUSCULAR | Status: AC
  Administered 2022-10-10: 10 mg

## 2022-10-10 NOTE — Progress Notes (Signed)
Subjective:   Patient ID: Kristina [name], female   DOB: 77 y.o.   MRN: [id number]   HPI Patient states she has had a flareup of pain in her left plantar heel that is very sore and makes it hard to be active   ROS      Objective:  Physical Exam  Neurovascular status intact exquisite discomfort left medial fascial band at the insertional point tendon calcaneus fluid buildup noted     Assessment:  Acute Planter fasciitis left     Plan:  H&P x-ray was reviewed sterile prep injected the plantar fascia 3 mg Kenalog 5 mg Xylocaine advised on supportive therapy reappoint to recheck  X-rays indicate spur no indication stress fracture arthritis

## 2023-06-26 ENCOUNTER — Other Ambulatory Visit: Payer: Self-pay | Admitting: Internal Medicine

## 2023-06-26 DIAGNOSIS — Z1231 Encounter for screening mammogram for malignant neoplasm of breast: Secondary | ICD-10-CM

## 2023-07-19 ENCOUNTER — Ambulatory Visit

## 2023-07-20 ENCOUNTER — Ambulatory Visit
Admission: RE | Admit: 2023-07-20 | Discharge: 2023-07-20 | Disposition: A | Discharge status: Home / Self Care | Source: Ambulatory Visit | Attending: Internal Medicine | Admitting: Internal Medicine

## 2023-07-20 DIAGNOSIS — Z1231 Encounter for screening mammogram for malignant neoplasm of breast: Secondary | ICD-10-CM

## 2023-08-21 ENCOUNTER — Ambulatory Visit: Payer: 59 | Attending: Cardiovascular Disease | Admitting: Cardiovascular Disease

## 2023-08-21 ENCOUNTER — Encounter: Payer: Self-pay | Admitting: Cardiovascular Disease

## 2023-08-21 VITALS — BP 162/98 | HR 77 | Ht 66.0 in | Wt 210.0 lb

## 2023-08-21 DIAGNOSIS — Z9861 Coronary angioplasty status: Secondary | ICD-10-CM | POA: Diagnosis not present

## 2023-08-21 DIAGNOSIS — I2510 Atherosclerotic heart disease of native coronary artery without angina pectoris: Secondary | ICD-10-CM

## 2023-08-21 DIAGNOSIS — I10 Essential (primary) hypertension: Secondary | ICD-10-CM

## 2023-08-21 DIAGNOSIS — E785 Hyperlipidemia, unspecified: Secondary | ICD-10-CM | POA: Diagnosis not present

## 2023-08-21 NOTE — Assessment & Plan Note (Signed)
 History of dyslipidemia on high-dose statin therapy and Zetia  followed by her PCP.  Her lipid profile was last checked 09/13/22 which time her total cholesterol is 172, LDL 83 and HDL 68.

## 2023-08-21 NOTE — Assessment & Plan Note (Signed)
 History of essential hypertension with blood pressure measured today 162/98.  She is on losartan .  She admits to being under a lot of stress at home probably contributing to her hypertension today.

## 2023-08-21 NOTE — Patient Instructions (Signed)

## 2023-08-21 NOTE — Assessment & Plan Note (Signed)
 History of CAD status post non-STEMI 06/22/2016.  She underwent cardiac catheterization by Dr. Arlester Ladd the following day revealing high-grade proximal LAD stenosis which was treated with a 4 mm Synergy drug-eluting stent postdilated to 4.5 mm.  She had she did have moderately high-grade circumflex obtuse marginal disease and a very tortuous vessel which was elected to be treated medically.  Her RCA was free of disease and she had normal LV function.  She had not has had no recurrent symptoms.  She remains on a baby aspirin .

## 2023-08-21 NOTE — Progress Notes (Signed)
 08/21/2023 [name]   1945-06-23  [id number]  Primary Physician Charle Congo, MD Primary Cardiologist: Avanell Leigh MD Bennye Bravo, [place]  HPI:  Kristina [name] is a 78 y.o.   moderately overweight single African-American female mother of 2, grandmother of 6 grandchildren and great grandmother of 4 great grandsons who  last saw her in the office 08/24/2022. She has a history of treated hypertension and hyperlipidemia.  She does not smoke and drinks socially.  She had a non-STEMI 06/22/2016 and underwent cardiac catheterization by Dr. Arlester Ladd at that time the following day revealing a high-grade proximal LAD stenosis treated with a 4 mm Synergy drug-eluting stent postdilated to 4.5 mm.  She did have moderately high-grade circumflex obtuse marginal branch over this was a tortuous vessel and was treated medically.  Her RCA was free of disease and she had normal LV function.     She was admitted to the hospital with Covid pneumonia this was complicated by pulmonary embolism and GI bleeding.  She ultimately recovered from this.  She underwent upper and lower endoscopy revealing erosive gastritis and a 3 to 4 cm colonic polyp.  Her oral anticoagulation was stopped as was her Plavix .  She ultimately underwent and and colon resection and has remained cancer free.   Since I saw her in the office a year ago she continues to do well.  She is under a lot of stress at home because of buying a new house.  She denies chest pain or shortness of breath.   Current Meds  Medication Sig   aspirin  81 MG chewable tablet Chew 1 tablet (81 mg total) by mouth daily.   atorvastatin  (LIPITOR ) 80 MG tablet Take 1 tablet (80 mg total) by mouth daily at 6 PM.   ezetimibe  (ZETIA ) 10 MG tablet Take 1 tablet (10 mg total) by mouth daily.   losartan  (COZAAR ) 50 MG tablet Take 50 mg by mouth daily.   TURMERIC-GINGER PO Take by mouth.   Vitamin D3 (VITAMIN D ) 25 MCG tablet Take 5 tablets (5,000 Units total)  by mouth daily.   Current Facility-Administered Medications for the 08/21/23 encounter (Office Visit) with Avanell Leigh, MD  Medication   0.9 %  sodium chloride  infusion     No Known Allergies  Social History   Socioeconomic History   Marital status: Divorced    Spouse name: Not on file   Number of children: Not on file   Years of education: Not on file   Highest education level: Not on file  Occupational History   Not on file  Tobacco Use   Smoking status: Never   Smokeless tobacco: Never  Vaping Use   Vaping status: Never Used  Substance and Sexual Activity   Alcohol use: Yes    Alcohol/week: 6.0 standard drinks of alcohol    Types: 6 Glasses of wine per week   Drug use: No   Sexual activity: Not Currently  Other Topics Concern   Not on file  Social History Narrative   Not on file   Social Drivers of Health   Financial Resource Strain: Not on file  Food Insecurity: Not on file  Transportation Needs: Not on file  Physical Activity: Not on file  Stress: Not on file  Social Connections: Unknown (08/20/2021)   [facility]   Social Network    Social Network: Not on file  Intimate Partner Violence: Unknown (07/13/2021)   [facility]   HITS    Physically Hurt: Not on file    Insult or Talk Down To: Not on file    Threaten Physical Harm: Not on file    Scream or Curse: Not on file     Review of Systems: General: negative for chills, fever, night sweats or weight changes.  Cardiovascular: negative for chest pain, dyspnea on exertion, edema, orthopnea, palpitations, paroxysmal nocturnal dyspnea or shortness of breath Dermatological: negative for rash Respiratory: negative for cough or wheezing Urologic: negative for hematuria Abdominal: negative for nausea, vomiting, diarrhea, bright red blood per rectum, melena, or hematemesis Neurologic: negative for visual changes, syncope, or dizziness All other  systems reviewed and are otherwise negative except as noted above.    Blood pressure (!) 162/98, pulse 77, height 5\' 6"  (1.676 m), weight 210 lb (95.3 kg), SpO2 93%.  General appearance: alert and no distress Neck: no adenopathy, no carotid bruit, no JVD, supple, symmetrical, trachea midline, and thyroid not enlarged, symmetric, no tenderness/mass/nodules Lungs: clear to auscultation bilaterally Heart: regular rate and rhythm, S1, S2 normal, no murmur, click, rub or gallop Extremities: extremities normal, atraumatic, no cyanosis or edema Pulses: 2+ and symmetric Skin: Skin color, texture, turgor normal. No rashes or lesions Neurologic: Grossly normal  EKG EKG Interpretation Date/Time:  Monday Aug 21 2023 14:48:59 EDT Ventricular Rate:  77 PR Interval:  186 QRS Duration:  90 QT Interval:  420 QTC Calculation: 475 R Axis:   -21  Text Interpretation: Normal sinus rhythm Minimal voltage criteria for LVH, may be normal variant ( Cornell product ) When compared with ECG of 25-Apr-2019 10:13, PREVIOUS ECG IS PRESENT Confirmed by Lauro Portal [phone]) on 08/21/2023 3:11:43 PM    ASSESSMENT AND PLAN:   Benign essential HTN History of essential hypertension with blood pressure measured today 162/98.  She is on losartan .  She admits to being under a lot of stress at home probably contributing to her hypertension today.  CAD S/P percutaneous coronary angioplasty History of CAD status post non-STEMI 06/22/2016.  She underwent cardiac catheterization by Dr. Arlester Ladd the following day revealing high-grade proximal LAD stenosis which was treated with a 4 mm Synergy drug-eluting stent postdilated to 4.5 mm.  She had she did have moderately high-grade circumflex obtuse marginal disease and a very tortuous vessel which was elected to be treated medically.  Her RCA was free of disease and she had normal LV function.  She had not has had no recurrent symptoms.  She remains on a baby  aspirin .  Dyslipidemia History of dyslipidemia on high-dose statin therapy and Zetia  followed by her PCP.  Her lipid profile was last checked 09/13/22 which time her total cholesterol is 172, LDL 83 and HDL 68.     Avanell Leigh MD FACP,FACC,FAHA, [facility] 08/21/2023 3:17 PM

## 2023-09-12 NOTE — Progress Notes (Unsigned)
 Pt was seen on 09/12/2023 at screening event. Bp was taken twice, 202/120 & 189/118. Pt did not indicate she has a PCP. Pt noted she does not smoke. Due to her hypertension the pt was referred to the mobile unit onsite at the event. No SDOH needs were indicated.

## 2023-10-01 ENCOUNTER — Other Ambulatory Visit: Payer: Self-pay | Admitting: Cardiovascular Disease

## 2023-10-01 DIAGNOSIS — E785 Hyperlipidemia, unspecified: Secondary | ICD-10-CM

## 2023-12-20 NOTE — Progress Notes (Signed)
 Pt attended 09/12/2023 screening event with BP of 189/118. At event pt did not indicate any SDOH needs. Pt also noted that she is not a smoker and listed Other as her insurance at the event.   Per initial f/u pt was sent a curtesy letter with BP management flyer & [place] flyer.  Per chart review pt does have a PCP (Edwin Avbuere; Licensed conveyancer, [place] [facility]orn Lesches; [facility]), insurance, and is not a smoker. Pt's last appt with PCP Raechel Lesches) on 08/21/2023. Pt does not indicate any SDOH needs at this time.  No additional pt f/u to be scheduled at this time per health equity protocol.

## 2024-01-03 ENCOUNTER — Encounter: Payer: Self-pay | Admitting: Internal Medicine

## 2024-01-08 ENCOUNTER — Other Ambulatory Visit: Payer: Self-pay

## 2024-01-08 DIAGNOSIS — E785 Hyperlipidemia, unspecified: Secondary | ICD-10-CM

## 2024-01-10 MED ORDER — EZETIMIBE 10 MG PO TABS: 10.0000 mg | ORAL_TABLET | Freq: Every day | ORAL | 1 refills | Status: DC

## 2024-02-07 ENCOUNTER — Other Ambulatory Visit: Payer: Self-pay | Admitting: Cardiovascular Disease

## 2024-02-07 DIAGNOSIS — E785 Hyperlipidemia, unspecified: Secondary | ICD-10-CM
